# Patient Record
Sex: Female | Born: 1950
Health system: Southern US, Community
[De-identification: ages and names within clinical notes are randomized; demographics above are authoritative.]

## PROBLEM LIST (undated history)

## (undated) DIAGNOSIS — Z9109 Other allergy status, other than to drugs and biological substances: Secondary | ICD-10-CM

## (undated) DIAGNOSIS — J45909 Unspecified asthma, uncomplicated: Secondary | ICD-10-CM

## (undated) DIAGNOSIS — K859 Acute pancreatitis without necrosis or infection, unspecified: Secondary | ICD-10-CM

## (undated) DIAGNOSIS — I10 Essential (primary) hypertension: Secondary | ICD-10-CM

## (undated) DIAGNOSIS — F32A Depression, unspecified: Secondary | ICD-10-CM

## (undated) DIAGNOSIS — H349 Unspecified retinal vascular occlusion: Secondary | ICD-10-CM

## (undated) DIAGNOSIS — E079 Disorder of thyroid, unspecified: Secondary | ICD-10-CM

## (undated) DIAGNOSIS — F329 Major depressive disorder, single episode, unspecified: Secondary | ICD-10-CM

## (undated) DIAGNOSIS — R413 Other amnesia: Secondary | ICD-10-CM

## (undated) HISTORY — DX: Disorder of thyroid, unspecified: E07.9

## (undated) HISTORY — DX: Unspecified asthma, uncomplicated: J45.909

## (undated) HISTORY — DX: Major depressive disorder, single episode, unspecified: F32.9

## (undated) HISTORY — PX: CHOLECYSTECTOMY: SHX55

## (undated) HISTORY — DX: Other allergy status, other than to drugs and biological substances: Z91.09

## (undated) HISTORY — DX: Other amnesia: R41.3

## (undated) HISTORY — PX: TONSILLECTOMY AND ADENOIDECTOMY: SUR1326

## (undated) HISTORY — DX: Unspecified retinal vascular occlusion: H34.9

## (undated) HISTORY — DX: Depression, unspecified: F32.A

## (undated) HISTORY — DX: Essential (primary) hypertension: I10

## (undated) HISTORY — PX: EYE SURGERY: SHX253

## (undated) HISTORY — DX: Acute pancreatitis without necrosis or infection, unspecified: K85.90

---

## 2013-03-06 DIAGNOSIS — J45909 Unspecified asthma, uncomplicated: Secondary | ICD-10-CM | POA: Insufficient documentation

## 2013-03-06 DIAGNOSIS — G4700 Insomnia, unspecified: Secondary | ICD-10-CM | POA: Insufficient documentation

## 2013-03-06 DIAGNOSIS — I10 Essential (primary) hypertension: Secondary | ICD-10-CM | POA: Insufficient documentation

## 2013-11-09 DIAGNOSIS — F339 Major depressive disorder, recurrent, unspecified: Secondary | ICD-10-CM | POA: Insufficient documentation

## 2014-04-26 DIAGNOSIS — G4733 Obstructive sleep apnea (adult) (pediatric): Secondary | ICD-10-CM | POA: Insufficient documentation

## 2014-08-01 ENCOUNTER — Ambulatory Visit (INDEPENDENT_AMBULATORY_CARE_PROVIDER_SITE_OTHER): Payer: Medicare Other | Admitting: Family Medicine

## 2014-08-01 ENCOUNTER — Ambulatory Visit: Payer: Self-pay | Admitting: Family Medicine

## 2014-08-01 ENCOUNTER — Encounter: Payer: Self-pay | Admitting: Family Medicine

## 2014-08-01 VITALS — BP 134/84 | HR 87 | Temp 98.0°F | Ht 62.75 in | Wt 170.2 lb

## 2014-08-01 DIAGNOSIS — H8110 Benign paroxysmal vertigo, unspecified ear: Secondary | ICD-10-CM

## 2014-08-01 MED ORDER — MECLIZINE HCL 25 MG PO TABS: 25.0000 mg | ORAL_TABLET | Freq: Three times a day (TID) | ORAL | Status: DC | PRN

## 2014-08-01 NOTE — Patient Instructions (Signed)

## 2014-08-01 NOTE — Progress Notes (Signed)
Pre visit review using our clinic review tool, if applicable. No additional management support is needed unless otherwise documented below in the visit note. 

## 2014-08-01 NOTE — Progress Notes (Signed)
Subjective:    Patient ID: Cynthia May, female    DOB: [birth year], 63 y.o.   MRN: [id number]  HPIpt here to establish care.  She has hx of vertigo but has not had an episode in over a year.   No other complaints.   Review of Systems As above  Past Medical History  Diagnosis Date  . Asthma   . Depression   . Environmental allergies   . HTN (hypertension)   . Thyroid disease   . Pancreatitis    History   Social History  . Marital Status: Widowed    Spouse Name: N/A    Number of Children: N/A  . Years of Education: N/A   Occupational History  . Not on file.   Social History Main Topics  . Smoking status: Never Smoker   . Smokeless tobacco: Never Used  . Alcohol Use: No  . Drug Use: No  . Sexual Activity: Not on file   Other Topics Concern  . Not on file   Social History Narrative  . No narrative on file   Family History  Problem Relation Age of Onset  . Arthritis Mother   . Colon cancer Brother   . Arthritis Sister   . Lung cancer Paternal Grandmother   . Prostate cancer Brother   . Heart disease Mother   . Heart disease Father   . Heart disease Brother   . Stroke Mother   . Depression Sister     5 silbling had depression from Thyroid disease   . Thyroid disease Mother    \ Current Outpatient Prescriptions  Medication Sig Dispense Refill  . alendronate (FOSAMAX) 70 MG tablet Take 1 tablet by mouth once a week.  4  . ANUCORT-HC 25 MG suppository Place 1 suppository rectally as needed.  11  . clonazePAM (KLONOPIN) 0.5 MG tablet Take 1.5 mg by mouth daily.  4  . cycloSPORINE (RESTASIS) 0.05 % ophthalmic emulsion 1 drop 2 (two) times daily.    . DULoxetine (CYMBALTA) 60 MG capsule Take 1 capsule by mouth daily.  5  . levothyroxine (SYNTHROID, LEVOTHROID) 112 MCG tablet Take 1 tablet by mouth daily.  11  . lisinopril (PRINIVIL,ZESTRIL) 10 MG tablet Take 1 tablet by mouth daily.  3  . NUVIGIL 250 MG tablet Take 0.5 tablets by mouth daily.    .  meclizine (ANTIVERT) 25 MG tablet Take 1 tablet (25 mg total) by mouth 3 (three) times daily as needed for dizziness. 30 tablet 0   No current facility-administered medications for this visit.       Objective:   Physical Exam BP 140/90 mmHg  Pulse 87  Temp(Src) 98 F (36.7 C) (Oral)  Ht 5' 2.75" (1.594 m)  Wt 170 lb 3.2 oz (77.202 kg)  BMI 30.38 kg/m2  SpO2 97% General appearance: alert, cooperative, appears stated age and no distress Throat: lips, mucosa, and tongue normal; teeth and gums normal Neck: no adenopathy, no carotid bruit, no JVD, supple, symmetrical, trachea midline and thyroid not enlarged, symmetric, no tenderness/mass/nodules Lungs: clear to auscultation bilaterally Heart: S1, S2 normal Extremities: extremities normal, atraumatic, no cyanosis or edema        Assessment & Plan:  1. BPV (benign positional vertigo), unspecified laterality Has not had an episode on over a year - meclizine (ANTIVERT) 25 MG tablet; Take 1 tablet (25 mg total) by mouth 3 (three) times daily as needed for dizziness.  Dispense: 30 tablet; Refill: 0 rto for welcome to medicare

## 2014-10-15 ENCOUNTER — Emergency Department (HOSPITAL_BASED_OUTPATIENT_CLINIC_OR_DEPARTMENT_OTHER)
Admission: EM | Admit: 2014-10-15 | Discharge: 2014-10-15 | Disposition: A | Discharge status: Home / Self Care | Payer: Medicare Other | Attending: Emergency Medicine | Admitting: Emergency Medicine

## 2014-10-15 ENCOUNTER — Emergency Department (HOSPITAL_BASED_OUTPATIENT_CLINIC_OR_DEPARTMENT_OTHER): Payer: Medicare Other

## 2014-10-15 ENCOUNTER — Encounter (HOSPITAL_BASED_OUTPATIENT_CLINIC_OR_DEPARTMENT_OTHER): Payer: Self-pay | Admitting: Emergency Medicine

## 2014-10-15 DIAGNOSIS — Z8719 Personal history of other diseases of the digestive system: Secondary | ICD-10-CM | POA: Insufficient documentation

## 2014-10-15 DIAGNOSIS — J069 Acute upper respiratory infection, unspecified: Secondary | ICD-10-CM

## 2014-10-15 DIAGNOSIS — F329 Major depressive disorder, single episode, unspecified: Secondary | ICD-10-CM | POA: Insufficient documentation

## 2014-10-15 DIAGNOSIS — R059 Cough, unspecified: Secondary | ICD-10-CM

## 2014-10-15 DIAGNOSIS — Z79899 Other long term (current) drug therapy: Secondary | ICD-10-CM | POA: Diagnosis not present

## 2014-10-15 DIAGNOSIS — I10 Essential (primary) hypertension: Secondary | ICD-10-CM | POA: Insufficient documentation

## 2014-10-15 DIAGNOSIS — J45909 Unspecified asthma, uncomplicated: Secondary | ICD-10-CM | POA: Insufficient documentation

## 2014-10-15 DIAGNOSIS — R05 Cough: Secondary | ICD-10-CM

## 2014-10-15 DIAGNOSIS — E039 Hypothyroidism, unspecified: Secondary | ICD-10-CM | POA: Insufficient documentation

## 2014-10-15 MED ORDER — AZITHROMYCIN 250 MG PO TABS: 250.0000 mg | ORAL_TABLET | Freq: Every day | ORAL | Status: DC

## 2014-10-15 MED ORDER — ALBUTEROL SULFATE HFA 108 (90 BASE) MCG/ACT IN AERS: 2.0000 | INHALATION_SPRAY | RESPIRATORY_TRACT | Status: DC | PRN

## 2014-10-15 MED ORDER — METHYLPREDNISOLONE 4 MG PO KIT: PACK | ORAL | Status: DC

## 2014-10-15 MED ORDER — GUAIFENESIN-CODEINE 100-10 MG/5ML PO SOLN: 5.0000 mL | Freq: Four times a day (QID) | ORAL | Status: DC | PRN

## 2014-10-15 NOTE — ED Notes (Signed)
Pt presents to ED with complaints of cough and congestion for the past week. PT states she has been taking OTC meds without relief.

## 2014-10-15 NOTE — Discharge Instructions (Signed)
Upper Respiratory Infection, Adult An upper respiratory infection (URI) is also sometimes known as the common cold. The upper respiratory tract includes the nose, sinuses, throat, trachea, and bronchi. Bronchi are the airways leading to the lungs. Most people improve within 1 week, but symptoms can last up to 2 weeks. A residual cough may last even longer.  CAUSES Many different viruses can infect the tissues lining the upper respiratory tract. The tissues become irritated and inflamed and often become very moist. Mucus production is also common. A cold is contagious. You can easily spread the virus to others by oral contact. This includes kissing, sharing a glass, coughing, or sneezing. Touching your mouth or nose and then touching a surface, which is then touched by another person, can also spread the virus. SYMPTOMS  Symptoms typically develop 1 to 3 days after you come in contact with a cold virus. Symptoms vary from person to person. They may include:  Runny nose.  Sneezing.  Nasal congestion.  Sinus irritation.  Sore throat.  Loss of voice (laryngitis).  Cough.  Fatigue.  Muscle aches.  Loss of appetite.  Headache.  Low-grade fever. DIAGNOSIS  You might diagnose your own cold based on familiar symptoms, since most people get a cold 2 to 3 times a year. Your caregiver can confirm this based on your exam. Most importantly, your caregiver can check that your symptoms are not due to another disease such as strep throat, sinusitis, pneumonia, asthma, or epiglottitis. Blood tests, throat tests, and X-rays are not necessary to diagnose a common cold, but they may sometimes be helpful in excluding other more serious diseases. Your caregiver will decide if any further tests are required. RISKS AND COMPLICATIONS  You may be at risk for a more severe case of the common cold if you smoke cigarettes, have chronic heart disease (such as heart failure) or lung disease (such as asthma), or if  you have a weakened immune system. The very young and very old are also at risk for more serious infections. Bacterial sinusitis, middle ear infections, and bacterial pneumonia can complicate the common cold. The common cold can worsen asthma and chronic obstructive pulmonary disease (COPD). Sometimes, these complications can require emergency medical care and may be life-threatening. PREVENTION  The best way to protect against getting a cold is to practice good hygiene. Avoid oral or hand contact with people with cold symptoms. Wash your hands often if contact occurs. There is no clear evidence that vitamin C, vitamin E, echinacea, or exercise reduces the chance of developing a cold. However, it is always recommended to get plenty of rest and practice good nutrition. TREATMENT  Treatment is directed at relieving symptoms. There is no cure. Antibiotics are not effective, because the infection is caused by a virus, not by bacteria. Treatment may include:  Increased fluid intake. Sports drinks offer valuable electrolytes, sugars, and fluids.  Breathing heated mist or steam (vaporizer or shower).  Eating chicken soup or other clear broths, and maintaining good nutrition.  Getting plenty of rest.  Using gargles or lozenges for comfort.  Controlling fevers with ibuprofen or acetaminophen as directed by your caregiver.  Increasing usage of your inhaler if you have asthma. Zinc gel and zinc lozenges, taken in the first 24 hours of the common cold, can shorten the duration and lessen the severity of symptoms. Pain medicines may help with fever, muscle aches, and throat pain. A variety of non-prescription medicines are available to treat congestion and runny nose. Your caregiver   can make recommendations and may suggest nasal or lung inhalers for other symptoms.  HOME CARE INSTRUCTIONS   Only take over-the-counter or prescription medicines for pain, discomfort, or fever as directed by your  caregiver.  Use a warm mist humidifier or inhale steam from a shower to increase air moisture. This may keep secretions moist and make it easier to breathe.  Drink enough water and fluids to keep your urine clear or pale yellow.  Rest as needed.  Return to work when your temperature has returned to normal or as your caregiver advises. You may need to stay home longer to avoid infecting others. You can also use a face mask and careful hand washing to prevent spread of the virus. SEEK MEDICAL CARE IF:   After the first few days, you feel you are getting worse rather than better.  You need your caregiver's advice about medicines to control symptoms.  You develop chills, worsening shortness of breath, or brown or red sputum. These may be signs of pneumonia.  You develop yellow or brown nasal discharge or pain in the face, especially when you bend forward. These may be signs of sinusitis.  You develop a fever, swollen neck glands, pain with swallowing, or white areas in the back of your throat. These may be signs of strep throat. SEEK IMMEDIATE MEDICAL CARE IF:   You have a fever.  You develop severe or persistent headache, ear pain, sinus pain, or chest pain.  You develop wheezing, a prolonged cough, cough up blood, or have a change in your usual mucus (if you have chronic lung disease).  You develop sore muscles or a stiff neck. Document Released: 03/12/2001 Document Revised: 12/09/2011 Document Reviewed: 12/22/2013 [employer] Patient Information 2015 [employer]. This information is not intended to replace advice given to you by your health care provider. Make sure you discuss any questions you have with your health care provider.  

## 2014-10-15 NOTE — ED Provider Notes (Signed)
TIME SEEN: 11:40 AM  CHIEF COMPLAINT: Cough, congestion  HPI: Pt is a 64 y.o. with history of hypertension, asthma who presents to the emergency department with nasal congestion, productive cough with yellow sputum for the past week. Denies any fevers or chills. Denies any shortness of breath or wheezing. States that her cough keeps her from sleeping at night. Has tried using over-the-counter cough suppressants without relief.  ROS: See HPI Constitutional: no fever  Eyes: no drainage  ENT: no runny nose   Cardiovascular:  no chest pain  Resp: no SOB  GI: no vomiting GU: no dysuria Integumentary: no rash  Allergy: no hives  Musculoskeletal: no leg swelling  Neurological: no slurred speech ROS otherwise negative  PAST MEDICAL HISTORY/PAST SURGICAL HISTORY:  Past Medical History  Diagnosis Date  . Asthma   . Depression   . Environmental allergies   . HTN (hypertension)   . Thyroid disease   . Pancreatitis     MEDICATIONS:  Prior to Admission medications   Medication Sig Start Date End Date Taking? Authorizing Provider  alendronate (FOSAMAX) 70 MG tablet Take 1 tablet by mouth once a week. 05/23/14   Historical Provider, MD  ANUCORT-HC 25 MG suppository Place 1 suppository rectally as needed. 05/13/14   Historical Provider, MD  clonazePAM (KLONOPIN) 0.5 MG tablet Take 1.5 mg by mouth daily. 07/04/14   Lendon Colonel, MD  cycloSPORINE (RESTASIS) 0.05 % ophthalmic emulsion 1 drop 2 (two) times daily.    Historical Provider, MD  DULoxetine (CYMBALTA) 60 MG capsule Take 1 capsule by mouth daily. 07/20/14   Historical Provider, MD  levothyroxine (SYNTHROID, LEVOTHROID) 112 MCG tablet Take 1 tablet by mouth daily. 06/30/14   Historical Provider, MD  lisinopril (PRINIVIL,ZESTRIL) 10 MG tablet Take 1 tablet by mouth daily. 05/13/14   Historical Provider, MD  meclizine (ANTIVERT) 25 MG tablet Take 1 tablet (25 mg total) by mouth 3 (three) times daily as needed for dizziness. 08/01/14   Alferd Apa  Lowne, DO  NUVIGIL 250 MG tablet Take 0.5 tablets by mouth daily. 07/25/14   Lendon Colonel, MD    ALLERGIES:  Allergies  Allergen Reactions  . Augmentin [Amoxicillin-Pot Clavulanate] Other (See Comments)    Flu like symptoms  . Erythromycin Other (See Comments)    Flu like symptoms    SOCIAL HISTORY:  History  Substance Use Topics  . Smoking status: Never Smoker   . Smokeless tobacco: Never Used  . Alcohol Use: No    FAMILY HISTORY: Family History  Problem Relation Age of Onset  . Arthritis Mother   . Colon cancer Brother   . Arthritis Sister   . Lung cancer Paternal Grandmother   . Prostate cancer Brother   . Heart disease Mother   . Heart disease Father   . Heart disease Brother   . Stroke Mother   . Depression Sister     5 silbling had depression from Thyroid disease   . Thyroid disease Mother     EXAM: BP 160/93 mmHg  Pulse 108  Temp(Src) 97.9 F (36.6 C) (Oral)  Resp 24  Ht 5\' 3"  (1.6 m)  Wt 168 lb (76.204 kg)  BMI 29.77 kg/m2  SpO2 96% CONSTITUTIONAL: Alert and oriented and responds appropriately to questions. Well-appearing; well-nourished HEAD: Normocephalic EYES: Conjunctivae clear, PERRL ENT: normal nose; no rhinorrhea; moist mucous membranes; pharynx without lesions noted NECK: Supple, no meningismus, no LAD  CARD: RRR; S1 and S2 appreciated; no murmurs, no clicks, no rubs, no gallops RESP:  Normal chest excursion without splinting or tachypnea; breath sounds clear and equal bilaterally; no wheezes, no rhonchi, no rales,  ABD/GI: Normal bowel sounds; non-distended; soft, non-tender, no rebound, no guarding BACK:  The back appears normal and is non-tender to palpation, there is no CVA tenderness EXT: Normal ROM in all joints; non-tender to palpation; no edema; normal capillary refill; no cyanosis    SKIN: Normal color for age and race; warm NEURO: Moves all extremities equally PSYCH: The patient's mood and manner are appropriate. Grooming and  personal hygiene are appropriate.  MEDICAL DECISION MAKING: Patient here with urinary symptoms. Given her history of asthma and bronchospasm, will treat with albuterol, Medrol Dosepak, azithromycin. Chest x-ray clear. She is nontoxic appearing, in no respiratory distress, has no increased work of breathing and is not hypoxic. I feel she is safe to be discharged home. Discussed return precautions. We'll also discharge with prescription for guaifenesin with codeine. Patient verbalizes understanding and is comfortable with this plan.       [place], DO 10/15/14 1539

## 2014-10-31 LAB — HM MAMMOGRAPHY

## 2014-12-07 ENCOUNTER — Other Ambulatory Visit: Payer: Self-pay

## 2014-12-07 DIAGNOSIS — H8110 Benign paroxysmal vertigo, unspecified ear: Secondary | ICD-10-CM

## 2014-12-07 MED ORDER — MECLIZINE HCL 25 MG PO TABS: 25.0000 mg | ORAL_TABLET | Freq: Three times a day (TID) | ORAL | Status: DC | PRN

## 2014-12-07 MED ORDER — ALENDRONATE SODIUM 70 MG PO TABS: 70.0000 mg | ORAL_TABLET | ORAL | Status: DC

## 2014-12-16 ENCOUNTER — Telehealth: Payer: Self-pay | Admitting: Family Medicine

## 2014-12-16 NOTE — Telephone Encounter (Signed)
pre visit letter sent °

## 2015-01-02 ENCOUNTER — Encounter: Payer: Medicare Other | Admitting: Family Medicine

## 2015-01-04 ENCOUNTER — Telehealth: Payer: Self-pay

## 2015-01-04 NOTE — Telephone Encounter (Signed)
Pre visit call/questionnaire  completed

## 2015-01-05 ENCOUNTER — Ambulatory Visit (INDEPENDENT_AMBULATORY_CARE_PROVIDER_SITE_OTHER): Payer: Medicare Other | Admitting: Family Medicine

## 2015-01-05 ENCOUNTER — Encounter: Payer: Self-pay | Admitting: Family Medicine

## 2015-01-05 ENCOUNTER — Ambulatory Visit (HOSPITAL_BASED_OUTPATIENT_CLINIC_OR_DEPARTMENT_OTHER)
Admission: RE | Admit: 2015-01-05 | Discharge: 2015-01-05 | Disposition: A | Discharge status: Home / Self Care | Payer: Medicare Other | Source: Ambulatory Visit | Attending: Family Medicine | Admitting: Family Medicine

## 2015-01-05 VITALS — BP 120/86 | HR 96 | Temp 97.7°F | Ht 63.25 in | Wt 167.2 lb

## 2015-01-05 DIAGNOSIS — R918 Other nonspecific abnormal finding of lung field: Secondary | ICD-10-CM | POA: Insufficient documentation

## 2015-01-05 DIAGNOSIS — J208 Acute bronchitis due to other specified organisms: Secondary | ICD-10-CM

## 2015-01-05 DIAGNOSIS — R059 Cough, unspecified: Secondary | ICD-10-CM

## 2015-01-05 DIAGNOSIS — Z0000 Encounter for general adult medical examination without abnormal findings: Secondary | ICD-10-CM

## 2015-01-05 DIAGNOSIS — R05 Cough: Secondary | ICD-10-CM | POA: Insufficient documentation

## 2015-01-05 DIAGNOSIS — Z136 Encounter for screening for cardiovascular disorders: Secondary | ICD-10-CM | POA: Diagnosis not present

## 2015-01-05 DIAGNOSIS — J209 Acute bronchitis, unspecified: Secondary | ICD-10-CM

## 2015-01-05 MED ORDER — LEVOFLOXACIN 500 MG PO TABS: 500.0000 mg | ORAL_TABLET | Freq: Every day | ORAL | Status: DC

## 2015-01-05 MED ORDER — METHYLPREDNISOLONE ACETATE 80 MG/ML IJ SUSP
80.0000 mg | Freq: Once | INTRAMUSCULAR | Status: AC
  Administered 2015-01-05: 80 mg via INTRAMUSCULAR

## 2015-01-05 MED ORDER — ALBUTEROL SULFATE (2.5 MG/3ML) 0.083% IN NEBU
2.5000 mg | INHALATION_SOLUTION | Freq: Once | RESPIRATORY_TRACT | Status: AC
  Administered 2015-01-05: 2.5 mg via RESPIRATORY_TRACT

## 2015-01-05 MED ORDER — GUAIFENESIN-CODEINE 100-10 MG/5ML PO SOLN: ORAL | Status: DC

## 2015-01-05 NOTE — Progress Notes (Signed)
Pre visit review using our clinic review tool, if applicable. No additional management support is needed unless otherwise documented below in the visit note. 

## 2015-01-05 NOTE — Progress Notes (Signed)
Subjective:   Cynthia May is a 64 y.o. female who presents for Medicare Annual (Subsequent) preventive examination.  Review of Systems:   Review of Systems  Constitutional: Negative for activity change, appetite change and fatigue.  HENT: Negative for hearing loss, congestion, tinnitus and ear discharge.   Eyes: Negative for visual disturbance (see optho q1y -- vision corrected to 20/20 with glasses).  Respiratory: Negative for cough, chest tightness and shortness of breath.   Cardiovascular: Negative for chest pain, palpitations and leg swelling.  Gastrointestinal: Negative for abdominal pain, diarrhea, constipation and abdominal distention.  Genitourinary: Negative for urgency, frequency, decreased urine volume and difficulty urinating.  Musculoskeletal: Negative for back pain, arthralgias and gait problem.  Skin: Negative for color change, pallor and rash.  Neurological: Negative for dizziness, light-headedness, numbness and headaches.  Hematological: Negative for adenopathy. Does not bruise/bleed easily.  Psychiatric/Behavioral: Negative for suicidal ideas, confusion, sleep disturbance, self-injury, dysphoric mood, decreased concentration and agitation.  Pt is able to read and write and can do all ADLs No risk for falling No abuse/ violence in home          Objective:     Vitals: BP 120/86 mmHg  Pulse 96  Temp(Src) 97.7 F (36.5 C) (Oral)  Ht 5' 3.25" (1.607 m)  Wt 167 lb 3.2 oz (75.841 kg)  BMI 29.37 kg/m2  SpO2 96% BP 120/86 mmHg  Pulse 96  Temp(Src) 97.7 F (36.5 C) (Oral)  Ht 5' 3.25" (1.607 m)  Wt 167 lb 3.2 oz (75.841 kg)  BMI 29.37 kg/m2  SpO2 96% General appearance: alert, cooperative, appears stated age and no distress Head: Normocephalic, without obvious abnormality, atraumatic Eyes: conjunctivae/corneas clear. PERRL, EOM's intact. Fundi benign. Ears: normal TM's and external ear canals both ears Nose: Nares normal. Septum midline. Mucosa  normal. No drainage or sinus tenderness. Throat: lips, mucosa, and tongue normal; teeth and gums normal Neck: moderate anterior cervical adenopathy, supple, symmetrical, trachea midline and thyroid not enlarged, symmetric, no tenderness/mass/nodules Back: symmetric, no curvature. ROM normal. No CVA tenderness. Lungs: rhonchi bilaterally and wheezes bilaterally Breasts: normal appearance, no masses or tenderness Heart: regular rate and rhythm, S1, S2 normal, no murmur, click, rub or gallop Abdomen: soft, non-tender; bowel sounds normal; no masses,  no organomegaly Pelvic: not indicated; post-menopausal, no abnormal Pap smears in past Extremities: extremities normal, atraumatic, no cyanosis or edema Pulses: 2+ and symmetric Skin: Skin color, texture, turgor normal. No rashes or lesions Lymph nodes: Cervical, supraclavicular, and axillary nodes normal. Neurologic: Alert and oriented X 3, normal strength and tone. Normal symmetric reflexes. Normal coordination and gait Psych- no depression, anxiety--- treated by psych Tobacco History  Smoking status  . Never Smoker   Smokeless tobacco  . Never Used     Counseling given: Not Answered   Past Medical History  Diagnosis Date  . Asthma   . Depression   . Environmental allergies   . HTN (hypertension)   . Thyroid disease   . Pancreatitis    Past Surgical History  Procedure Laterality Date  . Cholecystectomy    . Tonsillectomy and adenoidectomy     Family History  Problem Relation Age of Onset  . Arthritis Mother   . Colon cancer Brother   . Arthritis Sister   . Lung cancer Paternal Grandmother   . Prostate cancer Brother   . Heart disease Mother   . Heart disease Father   . Heart disease Brother   . Stroke Mother   . Depression Sister  5 silbling had depression from Thyroid disease   . Thyroid disease Mother    History  Sexual Activity  . Sexual Activity: Not on file    Outpatient Encounter Prescriptions as of  01/05/2015  Medication Sig  . albuterol (PROVENTIL HFA;VENTOLIN HFA) 108 (90 BASE) MCG/ACT inhaler Inhale 2 puffs into the lungs every 4 (four) hours as needed for wheezing or shortness of breath.  Marland Kitchen alendronate (FOSAMAX) 70 MG tablet Take 1 tablet (70 mg total) by mouth once a week.  . ANUCORT-HC 25 MG suppository Place 1 suppository rectally as needed.  . clonazePAM (KLONOPIN) 0.5 MG tablet Take 1.5 mg by mouth daily.  . cycloSPORINE (RESTASIS) 0.05 % ophthalmic emulsion 1 drop 2 (two) times daily.  . DULoxetine (CYMBALTA) 60 MG capsule Take 1 capsule by mouth daily.  Marland Kitchen levothyroxine (SYNTHROID, LEVOTHROID) 112 MCG tablet Take 1 tablet by mouth daily.  Marland Kitchen lisinopril (PRINIVIL,ZESTRIL) 10 MG tablet Take 1 tablet by mouth daily.  . meclizine (ANTIVERT) 25 MG tablet Take 1 tablet (25 mg total) by mouth 3 (three) times daily as needed for dizziness.  Marland Kitchen NUVIGIL 250 MG tablet Take 0.5 tablets by mouth daily.  Marland Kitchen guaiFENesin-codeine 100-10 MG/5ML syrup 1 tsp po q6 h prn cough  . levofloxacin (LEVAQUIN) 500 MG tablet Take 1 tablet (500 mg total) by mouth daily.  . [DISCONTINUED] beclomethasone (QVAR) 80 MCG/ACT inhaler Frequency:   Dosage:0.0     Instructions:  Note:  . [EXPIRED] albuterol (PROVENTIL) (2.5 MG/3ML) 0.083% nebulizer solution 2.5 mg   . [EXPIRED] methylPREDNISolone acetate (DEPO-MEDROL) injection 80 mg     Activities of Daily Living In your present state of health, do you have any difficulty performing the following activities: 08/01/2014  Is the patient deaf or have difficulty hearing? N  Hearing N  Vision Y  Difficulty concentrating or making decisions N  Walking or climbing stairs? N  Doing errands, shopping? N    Patient Care Team: Rosalita Chessman, DO as PCP - General (Family Medicine) Lendon Colonel, MD as Referring Physician (Psychiatry) Amalia Greenhouse, MD as Referring Physician (Endocrinology) Lang Snow, MD as Referring Physician (Gynecology) Karoline Caldwell, OD as Referring  Physician (Optometry)    Assessment:    cpe Exercise Activities and Dietary recommendations---  Pt unable to exercise at this time due to breathing    Goals    None     Fall Risk Fall Risk  01/05/2015  Falls in the past year? No   Depression Screen PHQ 2/9 Scores 01/05/2015  PHQ - 2 Score 0     Cognitive Testing No flowsheet data found.  Immunization History  Administered Date(s) Administered  . Influenza-Unspecified 07/15/2014   Screening Tests Health Maintenance  Topic Date Due  . TETANUS/TDAP  08/26/1968  . DEXA SCAN  08/26/2014  . PNA vac Low Risk Adult (1 of 2 - PCV13) 08/26/2014  . HIV Screening  01/05/2016 (Originally [birth year]26/1966)  . INFLUENZA VACCINE  05/01/2015  . MAMMOGRAM  10/31/2016  . COLONOSCOPY  07/16/2021  . ZOSTAVAX  Addressed      Plan:    CPE During the course of the visit the patient was educated and counseled about the following appropriate screening and preventive services:   Vaccines to include Pneumoccal, Influenza, Hepatitis B, Td, Zostavax, HCV  Electrocardiogram  Cardiovascular Disease  Colorectal cancer screening  Bone density screening  Diabetes screening  Glaucoma screening  Mammography/PAP  Nutrition counseling   Patient Instructions (the written plan) was given to the patient.  1. Ischemic heart disease screen   - Basic metabolic panel - CBC with Differential/Platelet - Hepatic function panel - Lipid panel - POCT urinalysis dipstick  2. Cough   - albuterol (PROVENTIL) (2.5 MG/3ML) 0.083% nebulizer solution 2.5 mg; Take 3 mLs (2.5 mg total) by nebulization once. - DG Chest 2 View; Future  3. Acute bronchitis due to other specified organisms   - levofloxacin (LEVAQUIN) 500 MG tablet; Take 1 tablet (500 mg total) by mouth daily.  Dispense: 10 tablet; Refill: 0 - guaiFENesin-codeine 100-10 MG/5ML syrup; 1 tsp po q6 h prn cough  Dispense: 120 mL; Refill: 0 - methylPREDNISolone acetate (DEPO-MEDROL) injection 80  mg; Inject 1 mL (80 mg total) into the muscle once.  4. Preventative health care    Garnet Koyanagi, DO  01/05/2015

## 2015-01-05 NOTE — Patient Instructions (Signed)
Preventive Care for Adults A healthy lifestyle and preventive care can promote health and wellness. Preventive health guidelines for women include the following key practices.  A routine yearly physical is a good way to check with your health care provider about your health and preventive screening. It is a chance to share any concerns and updates on your health and to receive a thorough exam.  Visit your dentist for a routine exam and preventive care every 6 months. Brush your teeth twice a day and floss once a day. Good oral hygiene prevents tooth decay and gum disease.  The frequency of eye exams is based on your age, health, family medical history, use of contact lenses, and other factors. Follow your health care provider's recommendations for frequency of eye exams.  Eat a healthy diet. Foods like vegetables, fruits, whole grains, low-fat dairy products, and lean protein foods contain the nutrients you need without too many calories. Decrease your intake of foods high in solid fats, added sugars, and salt. Eat the right amount of calories for you.Get information about a proper diet from your health care provider, if necessary.  Regular physical exercise is one of the most important things you can do for your health. Most adults should get at least 150 minutes of moderate-intensity exercise (any activity that increases your heart rate and causes you to sweat) each week. In addition, most adults need muscle-strengthening exercises on 2 or more days a week.  Maintain a healthy weight. The body mass index (BMI) is a screening tool to identify possible weight problems. It provides an estimate of body fat based on height and weight. Your health care provider can find your BMI and can help you achieve or maintain a healthy weight.For adults 20 years and older:  A BMI below 18.5 is considered underweight.  A BMI of 18.5 to 24.9 is normal.  A BMI of 25 to 29.9 is considered overweight.  A BMI of  30 and above is considered obese.  Maintain normal blood lipids and cholesterol levels by exercising and minimizing your intake of saturated fat. Eat a balanced diet with plenty of fruit and vegetables. Blood tests for lipids and cholesterol should begin at age 76 and be repeated every 5 years. If your lipid or cholesterol levels are high, you are over 50, or you are at high risk for heart disease, you may need your cholesterol levels checked more frequently.Ongoing high lipid and cholesterol levels should be treated with medicines if diet and exercise are not working.  If you smoke, find out from your health care provider how to quit. If you do not use tobacco, do not start.  Lung cancer screening is recommended for adults aged 22-80 years who are at high risk for developing lung cancer because of a history of smoking. A yearly low-dose CT scan of the lungs is recommended for people who have at least a 30-pack-year history of smoking and are a current smoker or have quit within the past 15 years. A pack year of smoking is smoking an average of 1 pack of cigarettes a day for 1 year (for example: 1 pack a day for 30 years or 2 packs a day for 15 years). Yearly screening should continue until the smoker has stopped smoking for at least 15 years. Yearly screening should be stopped for people who develop a health problem that would prevent them from having lung cancer treatment.  If you are pregnant, do not drink alcohol. If you are breastfeeding,  be very cautious about drinking alcohol. If you are not pregnant and choose to drink alcohol, do not have more than 1 drink per day. One drink is considered to be 12 ounces (355 mL) of beer, 5 ounces (148 mL) of wine, or 1.5 ounces (44 mL) of liquor.  Avoid use of street drugs. Do not share needles with anyone. Ask for help if you need support or instructions about stopping the use of drugs.  High blood pressure causes heart disease and increases the risk of  stroke. Your blood pressure should be checked at least every 1 to 2 years. Ongoing high blood pressure should be treated with medicines if weight loss and exercise do not work.  If you are 75-52 years old, ask your health care provider if you should take aspirin to prevent strokes.  Diabetes screening involves taking a blood sample to check your fasting blood sugar level. This should be done once every 3 years, after age 15, if you are within normal weight and without risk factors for diabetes. Testing should be considered at a younger age or be carried out more frequently if you are overweight and have at least 1 risk factor for diabetes.  Breast cancer screening is essential preventive care for women. You should practice "breast self-awareness." This means understanding the normal appearance and feel of your breasts and may include breast self-examination. Any changes detected, no matter how small, should be reported to a health care provider. Women in their 58s and 30s should have a clinical breast exam (CBE) by a health care provider as part of a regular health exam every 1 to 3 years. After age 16, women should have a CBE every year. Starting at age 53, women should consider having a mammogram (breast X-ray test) every year. Women who have a family history of breast cancer should talk to their health care provider about genetic screening. Women at a high risk of breast cancer should talk to their health care providers about having an MRI and a mammogram every year.  Breast cancer gene (BRCA)-related cancer risk assessment is recommended for women who have family members with BRCA-related cancers. BRCA-related cancers include breast, ovarian, tubal, and peritoneal cancers. Having family members with these cancers may be associated with an increased risk for harmful changes (mutations) in the breast cancer genes BRCA1 and BRCA2. Results of the assessment will determine the need for genetic counseling and  BRCA1 and BRCA2 testing.  Routine pelvic exams to screen for cancer are no longer recommended for nonpregnant women who are considered low risk for cancer of the pelvic organs (ovaries, uterus, and vagina) and who do not have symptoms. Ask your health care provider if a screening pelvic exam is right for you.  If you have had past treatment for cervical cancer or a condition that could lead to cancer, you need Pap tests and screening for cancer for at least 20 years after your treatment. If Pap tests have been discontinued, your risk factors (such as having a new sexual partner) need to be reassessed to determine if screening should be resumed. Some women have medical problems that increase the chance of getting cervical cancer. In these cases, your health care provider may recommend more frequent screening and Pap tests.  The HPV test is an additional test that may be used for cervical cancer screening. The HPV test looks for the virus that can cause the cell changes on the cervix. The cells collected during the Pap test can be  tested for HPV. The HPV test could be used to screen women aged 30 years and older, and should be used in women of any age who have unclear Pap test results. After the age of 30, women should have HPV testing at the same frequency as a Pap test.  Colorectal cancer can be detected and often prevented. Most routine colorectal cancer screening begins at the age of 50 years and continues through age 75 years. However, your health care provider may recommend screening at an earlier age if you have risk factors for colon cancer. On a yearly basis, your health care provider may provide home test kits to check for hidden blood in the stool. Use of a small camera at the end of a tube, to directly examine the colon (sigmoidoscopy or colonoscopy), can detect the earliest forms of colorectal cancer. Talk to your health care provider about this at age 50, when routine screening begins. Direct  exam of the colon should be repeated every 5-10 years through age 75 years, unless early forms of pre-cancerous polyps or small growths are found.  People who are at an increased risk for hepatitis B should be screened for this virus. You are considered at high risk for hepatitis B if:  You were born in a country where hepatitis B occurs often. Talk with your health care provider about which countries are considered high risk.  Your parents were born in a high-risk country and you have not received a shot to protect against hepatitis B (hepatitis B vaccine).  You have HIV or AIDS.  You use needles to inject street drugs.  You live with, or have sex with, someone who has hepatitis B.  You get hemodialysis treatment.  You take certain medicines for conditions like cancer, organ transplantation, and autoimmune conditions.  Hepatitis C blood testing is recommended for all people born from 1945 through 1965 and any individual with known risks for hepatitis C.  Practice safe sex. Use condoms and avoid high-risk sexual practices to reduce the spread of sexually transmitted infections (STIs). STIs include gonorrhea, chlamydia, syphilis, trichomonas, herpes, HPV, and human immunodeficiency virus (HIV). Herpes, HIV, and HPV are viral illnesses that have no cure. They can result in disability, cancer, and death.  You should be screened for sexually transmitted illnesses (STIs) including gonorrhea and chlamydia if:  You are sexually active and are younger than 24 years.  You are older than 24 years and your health care provider tells you that you are at risk for this type of infection.  Your sexual activity has changed since you were last screened and you are at an increased risk for chlamydia or gonorrhea. Ask your health care provider if you are at risk.  If you are at risk of being infected with HIV, it is recommended that you take a prescription medicine daily to prevent HIV infection. This is  called preexposure prophylaxis (PrEP). You are considered at risk if:  You are a heterosexual woman, are sexually active, and are at increased risk for HIV infection.  You take drugs by injection.  You are sexually active with a partner who has HIV.  Talk with your health care provider about whether you are at high risk of being infected with HIV. If you choose to begin PrEP, you should first be tested for HIV. You should then be tested every 3 months for as long as you are taking PrEP.  Osteoporosis is a disease in which the bones lose minerals and strength   with aging. This can result in serious bone fractures or breaks. The risk of osteoporosis can be identified using a bone density scan. Women ages 65 years and over and women at risk for fractures or osteoporosis should discuss screening with their health care providers. Ask your health care provider whether you should take a calcium supplement or vitamin D to reduce the rate of osteoporosis.  Menopause can be associated with physical symptoms and risks. Hormone replacement therapy is available to decrease symptoms and risks. You should talk to your health care provider about whether hormone replacement therapy is right for you.  Use sunscreen. Apply sunscreen liberally and repeatedly throughout the day. You should seek shade when your shadow is shorter than you. Protect yourself by wearing long sleeves, pants, a wide-brimmed hat, and sunglasses year round, whenever you are outdoors.  Once a month, do a whole body skin exam, using a mirror to look at the skin on your back. Tell your health care provider of new moles, moles that have irregular borders, moles that are larger than a pencil eraser, or moles that have changed in shape or color.  Stay current with required vaccines (immunizations).  Influenza vaccine. All adults should be immunized every year.  Tetanus, diphtheria, and acellular pertussis (Td, Tdap) vaccine. Pregnant women should  receive 1 dose of Tdap vaccine during each pregnancy. The dose should be obtained regardless of the length of time since the last dose. Immunization is preferred during the 27th-36th week of gestation. An adult who has not previously received Tdap or who does not know her vaccine status should receive 1 dose of Tdap. This initial dose should be followed by tetanus and diphtheria toxoids (Td) booster doses every 10 years. Adults with an unknown or incomplete history of completing a 3-dose immunization series with Td-containing vaccines should begin or complete a primary immunization series including a Tdap dose. Adults should receive a Td booster every 10 years.  Varicella vaccine. An adult without evidence of immunity to varicella should receive 2 doses or a second dose if she has previously received 1 dose. Pregnant females who do not have evidence of immunity should receive the first dose after pregnancy. This first dose should be obtained before leaving the health care facility. The second dose should be obtained 4-8 weeks after the first dose.  Human papillomavirus (HPV) vaccine. Females aged 13-26 years who have not received the vaccine previously should obtain the 3-dose series. The vaccine is not recommended for use in pregnant females. However, pregnancy testing is not needed before receiving a dose. If a female is found to be pregnant after receiving a dose, no treatment is needed. In that case, the remaining doses should be delayed until after the pregnancy. Immunization is recommended for any person with an immunocompromised condition through the age of 26 years if she did not get any or all doses earlier. During the 3-dose series, the second dose should be obtained 4-8 weeks after the first dose. The third dose should be obtained 24 weeks after the first dose and 16 weeks after the second dose.  Zoster vaccine. One dose is recommended for adults aged 60 years or older unless certain conditions are  present.  Measles, mumps, and rubella (MMR) vaccine. Adults born before 1957 generally are considered immune to measles and mumps. Adults born in 1957 or later should have 1 or more doses of MMR vaccine unless there is a contraindication to the vaccine or there is laboratory evidence of immunity to   each of the three diseases. A routine second dose of MMR vaccine should be obtained at least 28 days after the first dose for students attending postsecondary schools, health care workers, or international travelers. People who received inactivated measles vaccine or an unknown type of measles vaccine during 1963-1967 should receive 2 doses of MMR vaccine. People who received inactivated mumps vaccine or an unknown type of mumps vaccine before 1979 and are at high risk for mumps infection should consider immunization with 2 doses of MMR vaccine. For females of childbearing age, rubella immunity should be determined. If there is no evidence of immunity, females who are not pregnant should be vaccinated. If there is no evidence of immunity, females who are pregnant should delay immunization until after pregnancy. Unvaccinated health care workers born before 1957 who lack laboratory evidence of measles, mumps, or rubella immunity or laboratory confirmation of disease should consider measles and mumps immunization with 2 doses of MMR vaccine or rubella immunization with 1 dose of MMR vaccine.  Pneumococcal 13-valent conjugate (PCV13) vaccine. When indicated, a person who is uncertain of her immunization history and has no record of immunization should receive the PCV13 vaccine. An adult aged 19 years or older who has certain medical conditions and has not been previously immunized should receive 1 dose of PCV13 vaccine. This PCV13 should be followed with a dose of pneumococcal polysaccharide (PPSV23) vaccine. The PPSV23 vaccine dose should be obtained at least 8 weeks after the dose of PCV13 vaccine. An adult aged 19  years or older who has certain medical conditions and previously received 1 or more doses of PPSV23 vaccine should receive 1 dose of PCV13. The PCV13 vaccine dose should be obtained 1 or more years after the last PPSV23 vaccine dose.  Pneumococcal polysaccharide (PPSV23) vaccine. When PCV13 is also indicated, PCV13 should be obtained first. All adults aged 65 years and older should be immunized. An adult younger than age 65 years who has certain medical conditions should be immunized. Any person who resides in a nursing home or long-term care facility should be immunized. An adult smoker should be immunized. People with an immunocompromised condition and certain other conditions should receive both PCV13 and PPSV23 vaccines. People with human immunodeficiency virus (HIV) infection should be immunized as soon as possible after diagnosis. Immunization during chemotherapy or radiation therapy should be avoided. Routine use of PPSV23 vaccine is not recommended for American Indians, [place] Natives, or people younger than 65 years unless there are medical conditions that require PPSV23 vaccine. When indicated, people who have unknown immunization and have no record of immunization should receive PPSV23 vaccine. One-time revaccination 5 years after the first dose of PPSV23 is recommended for people aged 19-64 years who have chronic kidney failure, nephrotic syndrome, asplenia, or immunocompromised conditions. People who received 1-2 doses of PPSV23 before age 65 years should receive another dose of PPSV23 vaccine at age 65 years or later if at least 5 years have passed since the previous dose. Doses of PPSV23 are not needed for people immunized with PPSV23 at or after age 65 years.  Meningococcal vaccine. Adults with asplenia or persistent complement component deficiencies should receive 2 doses of quadrivalent meningococcal conjugate (MenACWY-D) vaccine. The doses should be obtained at least 2 months apart.  Microbiologists working with certain meningococcal bacteria, military recruits, people at risk during an outbreak, and people who travel to or live in countries with a high rate of meningitis should be immunized. A first-year college student up through age   21 years who is living in a residence hall should receive a dose if she did not receive a dose on or after her 16th birthday. Adults who have certain high-risk conditions should receive one or more doses of vaccine.  Hepatitis A vaccine. Adults who wish to be protected from this disease, have certain high-risk conditions, work with hepatitis A-infected animals, work in hepatitis A research labs, or travel to or work in countries with a high rate of hepatitis A should be immunized. Adults who were previously unvaccinated and who anticipate close contact with an international adoptee during the first 60 days after arrival in the [place] from a country with a high rate of hepatitis A should be immunized.  Hepatitis B vaccine. Adults who wish to be protected from this disease, have certain high-risk conditions, may be exposed to blood or other infectious body fluids, are household contacts or sex partners of hepatitis B positive people, are clients or workers in certain care facilities, or travel to or work in countries with a high rate of hepatitis B should be immunized.  Haemophilus influenzae type b (Hib) vaccine. A previously unvaccinated person with asplenia or sickle cell disease or having a scheduled splenectomy should receive 1 dose of Hib vaccine. Regardless of previous immunization, a recipient of a hematopoietic stem cell transplant should receive a 3-dose series 6-12 months after her successful transplant. Hib vaccine is not recommended for adults with HIV infection. Preventive Services / Frequency Ages 64 to 68 years  Blood pressure check.** / Every 1 to 2 years.  Lipid and cholesterol check.** / Every 5 years beginning at age  22.  Clinical breast exam.** / Every 3 years for women in their 88s and 53s.  BRCA-related cancer risk assessment.** / For women who have family members with a BRCA-related cancer (breast, ovarian, tubal, or peritoneal cancers).  Pap test.** / Every 2 years from ages 90 through 51. Every 3 years starting at age 21 through age 56 or 3 with a history of 3 consecutive normal Pap tests.  HPV screening.** / Every 3 years from ages 24 through ages 1 to 46 with a history of 3 consecutive normal Pap tests.  Hepatitis C blood test.** / For any individual with known risks for hepatitis C.  Skin self-exam. / Monthly.  Influenza vaccine. / Every year.  Tetanus, diphtheria, and acellular pertussis (Tdap, Td) vaccine.** / Consult your health care provider. Pregnant women should receive 1 dose of Tdap vaccine during each pregnancy. 1 dose of Td every 10 years.  Varicella vaccine.** / Consult your health care provider. Pregnant females who do not have evidence of immunity should receive the first dose after pregnancy.  HPV vaccine. / 3 doses over 6 months, if 72 and younger. The vaccine is not recommended for use in pregnant females. However, pregnancy testing is not needed before receiving a dose.  Measles, mumps, rubella (MMR) vaccine.** / You need at least 1 dose of MMR if you were born in 1957 or later. You may also need a 2nd dose. For females of childbearing age, rubella immunity should be determined. If there is no evidence of immunity, females who are not pregnant should be vaccinated. If there is no evidence of immunity, females who are pregnant should delay immunization until after pregnancy.  Pneumococcal 13-valent conjugate (PCV13) vaccine.** / Consult your health care provider.  Pneumococcal polysaccharide (PPSV23) vaccine.** / 1 to 2 doses if you smoke cigarettes or if you have certain conditions.  Meningococcal vaccine.** /  1 dose if you are age 19 to 21 years and a first-year college  student living in a residence hall, or have one of several medical conditions, you need to get vaccinated against meningococcal disease. You may also need additional booster doses.  Hepatitis A vaccine.** / Consult your health care provider.  Hepatitis B vaccine.** / Consult your health care provider.  Haemophilus influenzae type b (Hib) vaccine.** / Consult your health care provider. Ages 40 to 64 years  Blood pressure check.** / Every 1 to 2 years.  Lipid and cholesterol check.** / Every 5 years beginning at age 20 years.  Lung cancer screening. / Every year if you are aged 55-80 years and have a 30-pack-year history of smoking and currently smoke or have quit within the past 15 years. Yearly screening is stopped once you have quit smoking for at least 15 years or develop a health problem that would prevent you from having lung cancer treatment.  Clinical breast exam.** / Every year after age 40 years.  BRCA-related cancer risk assessment.** / For women who have family members with a BRCA-related cancer (breast, ovarian, tubal, or peritoneal cancers).  Mammogram.** / Every year beginning at age 40 years and continuing for as long as you are in good health. Consult with your health care provider.  Pap test.** / Every 3 years starting at age 30 years through age 65 or 70 years with a history of 3 consecutive normal Pap tests.  HPV screening.** / Every 3 years from ages 30 years through ages 65 to 70 years with a history of 3 consecutive normal Pap tests.  Fecal occult blood test (FOBT) of stool. / Every year beginning at age 50 years and continuing until age 75 years. You may not need to do this test if you get a colonoscopy every 10 years.  Flexible sigmoidoscopy or colonoscopy.** / Every 5 years for a flexible sigmoidoscopy or every 10 years for a colonoscopy beginning at age 50 years and continuing until age 75 years.  Hepatitis C blood test.** / For all people born from 1945 through  1965 and any individual with known risks for hepatitis C.  Skin self-exam. / Monthly.  Influenza vaccine. / Every year.  Tetanus, diphtheria, and acellular pertussis (Tdap/Td) vaccine.** / Consult your health care provider. Pregnant women should receive 1 dose of Tdap vaccine during each pregnancy. 1 dose of Td every 10 years.  Varicella vaccine.** / Consult your health care provider. Pregnant females who do not have evidence of immunity should receive the first dose after pregnancy.  Zoster vaccine.** / 1 dose for adults aged 60 years or older.  Measles, mumps, rubella (MMR) vaccine.** / You need at least 1 dose of MMR if you were born in 1957 or later. You may also need a 2nd dose. For females of childbearing age, rubella immunity should be determined. If there is no evidence of immunity, females who are not pregnant should be vaccinated. If there is no evidence of immunity, females who are pregnant should delay immunization until after pregnancy.  Pneumococcal 13-valent conjugate (PCV13) vaccine.** / Consult your health care provider.  Pneumococcal polysaccharide (PPSV23) vaccine.** / 1 to 2 doses if you smoke cigarettes or if you have certain conditions.  Meningococcal vaccine.** / Consult your health care provider.  Hepatitis A vaccine.** / Consult your health care provider.  Hepatitis B vaccine.** / Consult your health care provider.  Haemophilus influenzae type b (Hib) vaccine.** / Consult your health care provider. Ages 65   years and over  Blood pressure check.** / Every 1 to 2 years.  Lipid and cholesterol check.** / Every 5 years beginning at age 22 years.  Lung cancer screening. / Every year if you are aged 73-80 years and have a 30-pack-year history of smoking and currently smoke or have quit within the past 15 years. Yearly screening is stopped once you have quit smoking for at least 15 years or develop a health problem that would prevent you from having lung cancer  treatment.  Clinical breast exam.** / Every year after age 4 years.  BRCA-related cancer risk assessment.** / For women who have family members with a BRCA-related cancer (breast, ovarian, tubal, or peritoneal cancers).  Mammogram.** / Every year beginning at age 40 years and continuing for as long as you are in good health. Consult with your health care provider.  Pap test.** / Every 3 years starting at age 9 years through age 34 or 91 years with 3 consecutive normal Pap tests. Testing can be stopped between 65 and 70 years with 3 consecutive normal Pap tests and no abnormal Pap or HPV tests in the past 10 years.  HPV screening.** / Every 3 years from ages 57 years through ages 64 or 45 years with a history of 3 consecutive normal Pap tests. Testing can be stopped between 65 and 70 years with 3 consecutive normal Pap tests and no abnormal Pap or HPV tests in the past 10 years.  Fecal occult blood test (FOBT) of stool. / Every year beginning at age 15 years and continuing until age 17 years. You may not need to do this test if you get a colonoscopy every 10 years.  Flexible sigmoidoscopy or colonoscopy.** / Every 5 years for a flexible sigmoidoscopy or every 10 years for a colonoscopy beginning at age 86 years and continuing until age 71 years.  Hepatitis C blood test.** / For all people born from [birth year] through 1967 and any individual with known risks for hepatitis C.  Osteoporosis screening.** / A one-time screening for women ages 83 years and over and women at risk for fractures or osteoporosis.  Skin self-exam. / Monthly.  Influenza vaccine. / Every year.  Tetanus, diphtheria, and acellular pertussis (Tdap/Td) vaccine.** / 1 dose of Td every 10 years.  Varicella vaccine.** / Consult your health care provider.  Zoster vaccine.** / 1 dose for adults aged 61 years or older.  Pneumococcal 13-valent conjugate (PCV13) vaccine.** / Consult your health care provider.  Pneumococcal  polysaccharide (PPSV23) vaccine.** / 1 dose for all adults aged 28 years and older.  Meningococcal vaccine.** / Consult your health care provider.  Hepatitis A vaccine.** / Consult your health care provider.  Hepatitis B vaccine.** / Consult your health care provider.  Haemophilus influenzae type b (Hib) vaccine.** / Consult your health care provider. ** Family history and personal history of risk and conditions may change your health care provider's recommendations. Document Released: 11/12/2001 Document Revised: 01/31/2014 Document Reviewed: 02/11/2011 [facility] Patient Information 2015 [place], [place]. This information is not intended to replace advice given to you by your health care provider. Make sure you discuss any questions you have with your health care provider.

## 2015-01-19 ENCOUNTER — Encounter: Payer: Self-pay | Admitting: Family Medicine

## 2015-01-19 ENCOUNTER — Ambulatory Visit (INDEPENDENT_AMBULATORY_CARE_PROVIDER_SITE_OTHER): Payer: Medicare Other | Admitting: Family Medicine

## 2015-01-19 VITALS — BP 120/82 | HR 88 | Temp 97.5°F | Wt 162.0 lb

## 2015-01-19 DIAGNOSIS — J189 Pneumonia, unspecified organism: Secondary | ICD-10-CM | POA: Insufficient documentation

## 2015-01-19 DIAGNOSIS — Z23 Encounter for immunization: Secondary | ICD-10-CM

## 2015-01-19 LAB — POCT URINALYSIS DIPSTICK
Glucose, UA: NEGATIVE
Leukocytes, UA: NEGATIVE
NITRITE UA: NEGATIVE
UROBILINOGEN UA: 4
pH, UA: 5.5

## 2015-01-19 MED ORDER — TETANUS-DIPHTH-ACELL PERTUSSIS 5-2.5-18.5 LF-MCG/0.5 IM SUSP: 0.5000 mL | Freq: Once | INTRAMUSCULAR | Status: DC

## 2015-01-19 MED ORDER — PNEUMOCOCCAL 13-VAL CONJ VACC IM SUSP: 0.5000 mL | INTRAMUSCULAR | Status: DC

## 2015-01-19 NOTE — Assessment & Plan Note (Signed)
Symptoms resolved except for fatigue Recheck cxr 2 weeks

## 2015-01-19 NOTE — Patient Instructions (Addendum)
Get chest ray in 2 weeks-- the order is in the computer   Pneumonia Pneumonia is an infection of the lungs.  CAUSES Pneumonia may be caused by bacteria or a virus. Usually, these infections are caused by breathing infectious particles into the lungs (respiratory tract). SIGNS AND SYMPTOMS   Cough.  Fever.  Chest pain.  Increased rate of breathing.  Wheezing.  Mucus production. DIAGNOSIS  If you have the common symptoms of pneumonia, your health care provider will typically confirm the diagnosis with a chest X-ray. The X-ray will show an abnormality in the lung (pulmonary infiltrate) if you have pneumonia. Other tests of your blood, urine, or sputum may be done to find the specific cause of your pneumonia. Your health care provider may also do tests (blood gases or pulse oximetry) to see how well your lungs are working. TREATMENT  Some forms of pneumonia may be spread to other people when you cough or sneeze. You may be asked to wear a mask before and during your exam. Pneumonia that is caused by bacteria is treated with antibiotic medicine. Pneumonia that is caused by the influenza virus may be treated with an antiviral medicine. Most other viral infections must run their course. These infections will not respond to antibiotics.  HOME CARE INSTRUCTIONS   Cough suppressants may be used if you are losing too much rest. However, coughing protects you by clearing your lungs. You should avoid using cough suppressants if you can.  Your health care provider may have prescribed medicine if he or she thinks your pneumonia is caused by bacteria or influenza. Finish your medicine even if you start to feel better.  Your health care provider may also prescribe an expectorant. This loosens the mucus to be coughed up.  Take medicines only as directed by your health care provider.  Do not smoke. Smoking is a common cause of bronchitis and can contribute to pneumonia. If you are a smoker and  continue to smoke, your cough may last several weeks after your pneumonia has cleared.  A cold steam vaporizer or humidifier in your room or home may help loosen mucus.  Coughing is often worse at night. Sleeping in a semi-upright position in a recliner or using a couple pillows under your head will help with this.  Get rest as you feel it is needed. Your body will usually let you know when you need to rest. PREVENTION A pneumococcal shot (vaccine) is available to prevent a common bacterial cause of pneumonia. This is usually suggested for:  People over 19 years old.  Patients on chemotherapy.  People with chronic lung problems, such as bronchitis or emphysema.  People with immune system problems. If you are over 65 or have a high risk condition, you may receive the pneumococcal vaccine if you have not received it before. In some countries, a routine influenza vaccine is also recommended. This vaccine can help prevent some cases of pneumonia.You may be offered the influenza vaccine as part of your care. If you smoke, it is time to quit. You may receive instructions on how to stop smoking. Your health care provider can provide medicines and counseling to help you quit. SEEK MEDICAL CARE IF: You have a fever. SEEK IMMEDIATE MEDICAL CARE IF:   Your illness becomes worse. This is especially true if you are elderly or weakened from any other disease.  You cannot control your cough with suppressants and are losing sleep.  You begin coughing up blood.  You develop pain  which is getting worse or is uncontrolled with medicines.  Any of the symptoms which initially brought you in for treatment are getting worse rather than better.  You develop shortness of breath or chest pain. MAKE SURE YOU:   Understand these instructions.  Will watch your condition.  Will get help right away if you are not doing well or get worse. Document Released: 09/16/2005 Document Revised: 01/31/2014 Document  Reviewed: 12/06/2010 [facility] Patient Information 2015 [place], [place]. This information is not intended to replace advice given to you by your health care provider. Make sure you discuss any questions you have with your health care provider.

## 2015-01-19 NOTE — Progress Notes (Signed)
Patient ID: Cynthia May, female    DOB: [birth year]  Age: 64 y.o. MRN: [id number]    Subjective:  Subjective HPI Cynthia May presents for f/u pneumonia. Pt is feeling better but is tired.    Review of Systems  Constitutional: Positive for fatigue. Negative for activity change, appetite change and unexpected weight change.  Respiratory: Negative for cough and shortness of breath.   Cardiovascular: Negative for chest pain and palpitations.  Psychiatric/Behavioral: Negative for behavioral problems and dysphoric mood. The patient is not nervous/anxious.     History Past Medical History  Diagnosis Date  . Asthma   . Depression   . Environmental allergies   . HTN (hypertension)   . Thyroid disease   . Pancreatitis     She has past surgical history that includes Cholecystectomy and Tonsillectomy and adenoidectomy.   Her family history includes Arthritis in her mother and sister; Colon cancer in her brother; Depression in her sister; Heart disease in her brother, father, and mother; Lung cancer in her paternal grandmother; Prostate cancer in her brother; Stroke in her mother; Thyroid disease in her mother.She reports that she has never smoked. She has never used smokeless tobacco. She reports that she does not drink alcohol or use illicit drugs.  Current Outpatient Prescriptions on File Prior to Visit  Medication Sig Dispense Refill  . albuterol (PROVENTIL HFA;VENTOLIN HFA) 108 (90 BASE) MCG/ACT inhaler Inhale 2 puffs into the lungs every 4 (four) hours as needed for wheezing or shortness of breath. 1 Inhaler 0  . alendronate (FOSAMAX) 70 MG tablet Take 1 tablet (70 mg total) by mouth once a week. 4 tablet 4  . ANUCORT-HC 25 MG suppository Place 1 suppository rectally as needed.  11  . clonazePAM (KLONOPIN) 0.5 MG tablet Take 1.5 mg by mouth daily.  4  . cycloSPORINE (RESTASIS) 0.05 % ophthalmic emulsion 1 drop 2 (two) times daily.    . DULoxetine (CYMBALTA) 60 MG capsule  Take 1 capsule by mouth daily.  5  . guaiFENesin-codeine 100-10 MG/5ML syrup 1 tsp po q6 h prn cough 120 mL 0  . levothyroxine (SYNTHROID, LEVOTHROID) 112 MCG tablet Take 1 tablet by mouth daily.  11  . lisinopril (PRINIVIL,ZESTRIL) 10 MG tablet Take 1 tablet by mouth daily.  3  . meclizine (ANTIVERT) 25 MG tablet Take 1 tablet (25 mg total) by mouth 3 (three) times daily as needed for dizziness. 30 tablet 0  . NUVIGIL 250 MG tablet Take 0.5 tablets by mouth daily.     No current facility-administered medications on file prior to visit.     Objective:  Objective Physical Exam  Constitutional: She is oriented to person, place, and time. She appears well-developed and well-nourished.  HENT:  Right Ear: External ear normal.  Left Ear: External ear normal.  Eyes: Conjunctivae are normal. Right eye exhibits no discharge. Left eye exhibits no discharge.  Cardiovascular: Normal rate, regular rhythm and normal heart sounds.   No murmur heard. Pulmonary/Chest: Effort normal and breath sounds normal. No respiratory distress. She has no wheezes. She has no rales. She exhibits no tenderness.  Musculoskeletal: She exhibits no edema.  Neurological: She is alert and oriented to person, place, and time.   BP 120/82 mmHg  Pulse 88  Temp(Src) 97.5 F (36.4 C) (Oral)  Wt 162 lb (73.483 kg)  SpO2 96% Wt Readings from Last 3 Encounters:  01/19/15 162 lb (73.483 kg)  01/05/15 167 lb 3.2 oz (75.841 kg)  10/15/14 168 lb (76.204 kg)  No results found for: WBC, HGB, HCT, PLT, GLUCOSE, CHOL, TRIG, HDL, LDLDIRECT, LDLCALC, ALT, AST, NA, K, CL, CREATININE, BUN, CO2, TSH, PSA, INR, GLUF, HGBA1C, MICROALBUR  Dg Chest 2 View  01/06/2015   CLINICAL DATA:  One week history of cough and chest pain  EXAM: CHEST  2 VIEW  COMPARISON:  October 15, 2014  FINDINGS: There is patchy interstitial infiltrate in portions of the right middle and lower lobes. Elsewhere lungs clear. Heart size and pulmonary vascularity are  normal. No adenopathy. There is degenerative change in the thoracic spine.  IMPRESSION: Interstitial infiltrate in portions of the right middle and lower lobes. Lungs elsewhere clear.   Electronically Signed   By: Lowella Grip III M.D.   On: 01/06/2015 08:22     Assessment & Plan:  Plan I have discontinued Cynthia May's levofloxacin. I am also having her start on pneumococcal 13-valent conjugate vaccine and Tdap. Additionally, I am having her maintain her NUVIGIL, clonazePAM, DULoxetine, ANUCORT-HC, levothyroxine, lisinopril, cycloSPORINE, albuterol, alendronate, meclizine, and guaiFENesin-codeine.  Meds ordered this encounter  Medications  . pneumococcal 13-valent conjugate vaccine (PREVNAR 13) SUSP injection    Sig: Inject 0.5 mLs into the muscle tomorrow at 10 am.    Dispense:  1 mL    Refill:  0  . Tdap (BOOSTRIX) 5-2.5-18.5 LF-MCG/0.5 injection    Sig: Inject 0.5 mLs into the muscle once.    Dispense:  0.5 mL    Refill:  0    Problem List Items Addressed This Visit    CAP (community acquired pneumonia) - Primary    Symptoms resolved except for fatigue Recheck cxr 2 weeks       Relevant Orders   DG Chest 2 View    Other Visit Diagnoses    Need for prophylactic vaccination against Streptococcus pneumoniae (pneumococcus)        Relevant Medications    pneumococcal 13-valent conjugate vaccine (PREVNAR 13) SUSP injection    Need for tetanus booster        Relevant Medications    Tdap ([place]) 5-2.5-18.5 LF-MCG/0.5 injection       Follow-up: Return if symptoms worsen or fail to improve.  Garnet Koyanagi, DO

## 2015-01-19 NOTE — Addendum Note (Signed)
Addended by: Modena Morrow D on: 01/19/2015 04:52 PM   Modules accepted: Orders

## 2015-01-19 NOTE — Progress Notes (Signed)
Pre visit review using our clinic review tool, if applicable. No additional management support is needed unless otherwise documented below in the visit note. 

## 2015-01-20 LAB — CBC WITH DIFFERENTIAL/PLATELET
BASOS PCT: 0.8 % (ref 0.0–3.0)
Basophils Absolute: 0.1 10*3/uL (ref 0.0–0.1)
EOS ABS: 0.2 10*3/uL (ref 0.0–0.7)
Eosinophils Relative: 3.4 % (ref 0.0–5.0)
HCT: 43.8 % (ref 36.0–46.0)
Hemoglobin: 14.9 g/dL (ref 12.0–15.0)
LYMPHS PCT: 29 % (ref 12.0–46.0)
Lymphs Abs: 2.1 10*3/uL (ref 0.7–4.0)
MCHC: 34 g/dL (ref 30.0–36.0)
MCV: 85.9 fl (ref 78.0–100.0)
Monocytes Absolute: 0.4 10*3/uL (ref 0.1–1.0)
Monocytes Relative: 5.7 % (ref 3.0–12.0)
NEUTROS PCT: 61.1 % (ref 43.0–77.0)
Neutro Abs: 4.3 10*3/uL (ref 1.4–7.7)
PLATELETS: 220 10*3/uL (ref 150.0–400.0)
RBC: 5.09 Mil/uL (ref 3.87–5.11)
RDW: 13.9 % (ref 11.5–15.5)
WBC: 7.1 10*3/uL (ref 4.0–10.5)

## 2015-01-20 LAB — LIPID PANEL
CHOLESTEROL: 169 mg/dL (ref 0–200)
HDL: 44.7 mg/dL (ref >39.00)
LDL CALC: 110 mg/dL — AB (ref 0–99)
NONHDL: 124.3
Total CHOL/HDL Ratio: 4
Triglycerides: 72 mg/dL (ref 0.0–149.0)
VLDL: 14.4 mg/dL (ref 0.0–40.0)

## 2015-01-20 LAB — BASIC METABOLIC PANEL
BUN: 16 mg/dL (ref 6–23)
CALCIUM: 9.4 mg/dL (ref 8.4–10.5)
CO2: 29 mEq/L (ref 19–32)
Chloride: 108 mEq/L (ref 96–112)
Creatinine, Ser: 0.83 mg/dL (ref 0.40–1.20)
GFR: 73.24 mL/min (ref >60.00)
Glucose, Bld: 86 mg/dL (ref 70–99)
POTASSIUM: 4.3 meq/L (ref 3.5–5.1)
Sodium: 142 mEq/L (ref 135–145)

## 2015-01-20 LAB — HEPATIC FUNCTION PANEL
ALBUMIN: 4.1 g/dL (ref 3.5–5.2)
ALT: 24 U/L (ref 0–35)
AST: 30 U/L (ref 0–37)
Alkaline Phosphatase: 76 U/L (ref 39–117)
BILIRUBIN TOTAL: 0.8 mg/dL (ref 0.2–1.2)
Bilirubin, Direct: 0.1 mg/dL (ref 0.0–0.3)
Total Protein: 6.9 g/dL (ref 6.0–8.3)

## 2015-01-22 LAB — URINE CULTURE: Colony Count: 70000

## 2015-02-02 ENCOUNTER — Ambulatory Visit (HOSPITAL_BASED_OUTPATIENT_CLINIC_OR_DEPARTMENT_OTHER)
Admission: RE | Admit: 2015-02-02 | Discharge: 2015-02-02 | Disposition: A | Discharge status: Home / Self Care | Payer: Medicare Other | Source: Ambulatory Visit | Attending: Family Medicine | Admitting: Family Medicine

## 2015-02-02 DIAGNOSIS — J189 Pneumonia, unspecified organism: Secondary | ICD-10-CM | POA: Insufficient documentation

## 2015-02-03 ENCOUNTER — Other Ambulatory Visit: Payer: Self-pay

## 2015-02-03 MED ORDER — LEVOFLOXACIN 500 MG PO TABS: 500.0000 mg | ORAL_TABLET | Freq: Every day | ORAL | Status: DC

## 2015-02-28 ENCOUNTER — Other Ambulatory Visit: Payer: Self-pay

## 2015-02-28 DIAGNOSIS — J189 Pneumonia, unspecified organism: Secondary | ICD-10-CM

## 2015-03-17 ENCOUNTER — Ambulatory Visit (HOSPITAL_BASED_OUTPATIENT_CLINIC_OR_DEPARTMENT_OTHER)
Admission: RE | Admit: 2015-03-17 | Discharge: 2015-03-17 | Disposition: A | Discharge status: Home / Self Care | Payer: Medicare Other | Source: Ambulatory Visit | Attending: Family Medicine | Admitting: Family Medicine

## 2015-03-17 DIAGNOSIS — J189 Pneumonia, unspecified organism: Secondary | ICD-10-CM | POA: Diagnosis not present

## 2015-03-20 ENCOUNTER — Encounter: Payer: Self-pay | Admitting: Family Medicine

## 2015-04-25 ENCOUNTER — Encounter: Payer: Self-pay | Admitting: Family Medicine

## 2015-04-25 ENCOUNTER — Ambulatory Visit (INDEPENDENT_AMBULATORY_CARE_PROVIDER_SITE_OTHER): Payer: Medicare Other | Admitting: Family Medicine

## 2015-04-25 ENCOUNTER — Ambulatory Visit (HOSPITAL_BASED_OUTPATIENT_CLINIC_OR_DEPARTMENT_OTHER)
Admission: RE | Admit: 2015-04-25 | Discharge: 2015-04-25 | Disposition: A | Discharge status: Home / Self Care | Payer: Medicare Other | Source: Ambulatory Visit | Attending: Family Medicine | Admitting: Family Medicine

## 2015-04-25 VITALS — BP 110/72 | HR 95 | Temp 97.9°F | Wt 165.6 lb

## 2015-04-25 VITALS — BP 130/90 | HR 106 | Ht 64.0 in | Wt 165.0 lb

## 2015-04-25 DIAGNOSIS — M25572 Pain in left ankle and joints of left foot: Secondary | ICD-10-CM

## 2015-04-25 DIAGNOSIS — M2540 Effusion, unspecified joint: Secondary | ICD-10-CM | POA: Diagnosis not present

## 2015-04-25 DIAGNOSIS — M7732 Calcaneal spur, left foot: Secondary | ICD-10-CM | POA: Insufficient documentation

## 2015-04-25 MED ORDER — PREDNISONE 10 MG PO TABS: ORAL_TABLET | ORAL | Status: DC

## 2015-04-25 NOTE — Patient Instructions (Signed)

## 2015-04-25 NOTE — Progress Notes (Signed)
Pre visit review using our clinic review tool, if applicable. No additional management support is needed unless otherwise documented below in the visit note. 

## 2015-04-25 NOTE — Patient Instructions (Signed)
This is most consistent with a gout flare given the amount of swelling, quick onset, lack of an injury.  Other consideration would be sinus tarsi syndrome which is treated with better arch support (something like dr. Zoe Lan active series). Prednisone dose pack for 6 days as directed. After you finish this you can take aleve 2 tabs twice a day with food if needed. Icing and elevation as needed for swelling. Your pain should improve before the swelling goes away (may take several weeks for the swelling to improve). Ankle brace is a consideration though you don't have instability - I wouldn't use this in your case. Follow up with me in 1 month. Have fun in [facility]!

## 2015-04-25 NOTE — Progress Notes (Signed)
Subjective:    Patient ID: Cynthia May, female    DOB: 01-10-1951, 64 y.o.   MRN: [id number]  HPI  Patient here with c/o L ankle pain x 3 weeks.  No known injury.  Pt states it feels good in am and starts to hurt as day goes on and then by the end of the day the pain is severe.  Difficult to walk on it then.    Past Medical History  Diagnosis Date  . Asthma   . Depression   . Environmental allergies   . HTN (hypertension)   . Thyroid disease   . Pancreatitis     Review of Systems  Constitutional: Negative for diaphoresis, appetite change, fatigue and unexpected weight change.  Eyes: Negative for pain, redness and visual disturbance.  Respiratory: Negative for cough, chest tightness, shortness of breath and wheezing.   Cardiovascular: Negative for chest pain, palpitations and leg swelling.  Endocrine: Negative for cold intolerance, heat intolerance, polydipsia, polyphagia and polyuria.  Genitourinary: Negative for dysuria, frequency and difficulty urinating.  Musculoskeletal: Positive for joint swelling and gait problem.  Neurological: Negative for dizziness, light-headedness, numbness and headaches.    Current Outpatient Prescriptions on File Prior to Visit  Medication Sig Dispense Refill  . alendronate (FOSAMAX) 70 MG tablet Take 1 tablet (70 mg total) by mouth once a week. 4 tablet 4  . ANUCORT-HC 25 MG suppository Place 1 suppository rectally as needed.  11  . clonazePAM (KLONOPIN) 0.5 MG tablet Take 1.5 mg by mouth daily.  4  . cycloSPORINE (RESTASIS) 0.05 % ophthalmic emulsion 1 drop 2 (two) times daily.    . DULoxetine (CYMBALTA) 60 MG capsule Take 1 capsule by mouth daily.  5  . levothyroxine (SYNTHROID, LEVOTHROID) 112 MCG tablet Take 1 tablet by mouth daily.  11  . lisinopril (PRINIVIL,ZESTRIL) 10 MG tablet Take 1 tablet by mouth daily.  3  . meclizine (ANTIVERT) 25 MG tablet Take 1 tablet (25 mg total) by mouth 3 (three) times daily as needed for dizziness.  30 tablet 0  . NUVIGIL 250 MG tablet Take 0.5 tablets by mouth daily.     No current facility-administered medications on file prior to visit.       Objective:    Physical Exam  Constitutional: She is oriented to person, place, and time. She appears well-developed and well-nourished.  HENT:  Head: Normocephalic and atraumatic.  Eyes: Conjunctivae and EOM are normal.  Neck: Normal range of motion. Neck supple. No JVD present. Carotid bruit is not present. No thyromegaly present.  Pulmonary/Chest: Breath sounds normal.  Musculoskeletal: She exhibits edema and tenderness.       Left ankle: She exhibits swelling. She exhibits normal range of motion, no ecchymosis, no deformity, no laceration and normal pulse. Tenderness. Lateral malleolus tenderness found.       Feet:  Neurological: She is alert and oriented to person, place, and time.  Psychiatric: She has a normal mood and affect.    BP 110/72 mmHg  Pulse 95  Temp(Src) 97.9 F (36.6 C) (Oral)  Wt 165 lb 9.6 oz (75.116 kg)  SpO2 98% Wt Readings from Last 3 Encounters:  04/25/15 165 lb (74.844 kg)  04/25/15 165 lb 9.6 oz (75.116 kg)  01/19/15 162 lb (73.483 kg)     Lab Results  Component Value Date   WBC 7.1 01/19/2015   HGB 14.9 01/19/2015   HCT 43.8 01/19/2015   PLT 220.0 01/19/2015   GLUCOSE 86 01/19/2015   CHOL  169 01/19/2015   TRIG 72.0 01/19/2015   HDL 44.70 01/19/2015   LDLCALC 110* 01/19/2015   ALT 24 01/19/2015   AST 30 01/19/2015   NA 142 01/19/2015   K 4.3 01/19/2015   CL 108 01/19/2015   CREATININE 0.83 01/19/2015   BUN 16 01/19/2015   CO2 29 01/19/2015       Assessment & Plan:   Problem List Items Addressed This Visit      Unprioritized   Left ankle pain - Primary    Because pt is leaving this afternoon on airplane will try to get pt seen by sport med today      Relevant Orders   DG Ankle Complete Left (Completed)   Ambulatory referral to Sports Medicine    Other Visit Diagnoses     Swollen joint        Relevant Orders    Basic metabolic panel    CBC with Differential/Platelet    Sedimentation rate    ANA    Rheumatoid factor    Ambulatory referral to Sports Medicine       I have discontinued Ms. Milone's albuterol, guaiFENesin-codeine, pneumococcal 13-valent conjugate vaccine, Tdap, and levofloxacin. I am also having her maintain her NUVIGIL, clonazePAM, DULoxetine, ANUCORT-HC, levothyroxine, lisinopril, cycloSPORINE, alendronate, and meclizine.  No orders of the defined types were placed in this encounter.     Garnet Koyanagi, DO

## 2015-04-26 DIAGNOSIS — M25572 Pain in left ankle and joints of left foot: Secondary | ICD-10-CM | POA: Insufficient documentation

## 2015-04-26 NOTE — Assessment & Plan Note (Signed)
most consistent with acute gout flare given lack of injury, swelling, tenderness, rapid onset.  No evidence cellulitis.  Encouraged wearing good arch supports, prednisone dose pack then aleve as needed.  Icing, elevation.  F/u in 1 month.

## 2015-04-26 NOTE — Progress Notes (Signed)
PCP and referred by: Garnet Koyanagi, DO  Subjective:   HPI: Patient is a 64 y.o. female here for left ankle swelling, pain.  Patient denies known injury or increase in activity level. She states about 3 weeks ago noticed lateral left ankle swelling and development of pain with certain motions of the ankle. Some warmth to it but no redness, fevers, chills, sweats. No prior issues with this ankle.  Past Medical History  Diagnosis Date  . Asthma   . Depression   . Environmental allergies   . HTN (hypertension)   . Thyroid disease   . Pancreatitis     Current Outpatient Prescriptions on File Prior to Visit  Medication Sig Dispense Refill  . alendronate (FOSAMAX) 70 MG tablet Take 1 tablet (70 mg total) by mouth once a week. 4 tablet 4  . ANUCORT-HC 25 MG suppository Place 1 suppository rectally as needed.  11  . clonazePAM (KLONOPIN) 0.5 MG tablet Take 1.5 mg by mouth daily.  4  . cycloSPORINE (RESTASIS) 0.05 % ophthalmic emulsion 1 drop 2 (two) times daily.    . DULoxetine (CYMBALTA) 60 MG capsule Take 1 capsule by mouth daily.  5  . levothyroxine (SYNTHROID, LEVOTHROID) 112 MCG tablet Take 1 tablet by mouth daily.  11  . lisinopril (PRINIVIL,ZESTRIL) 10 MG tablet Take 1 tablet by mouth daily.  3  . meclizine (ANTIVERT) 25 MG tablet Take 1 tablet (25 mg total) by mouth 3 (three) times daily as needed for dizziness. 30 tablet 0  . NUVIGIL 250 MG tablet Take 0.5 tablets by mouth daily.     No current facility-administered medications on file prior to visit.    Past Surgical History  Procedure Laterality Date  . Cholecystectomy    . Tonsillectomy and adenoidectomy      Allergies  Allergen Reactions  . Augmentin [Amoxicillin-Pot Clavulanate] Other (See Comments)    Flu like symptoms  . Erythromycin Other (See Comments)    Flu like symptoms    History   Social History  . Marital Status: Widowed    Spouse Name: N/A  . Number of Children: N/A  . Years of Education: N/A    Occupational History  . Not on file.   Social History Main Topics  . Smoking status: Never Smoker   . Smokeless tobacco: Never Used  . Alcohol Use: No  . Drug Use: No  . Sexual Activity: Not on file   Other Topics Concern  . Not on file   Social History Narrative    Family History  Problem Relation Age of Onset  . Arthritis Mother   . Colon cancer Brother   . Arthritis Sister   . Lung cancer Paternal Grandmother   . Prostate cancer Brother   . Heart disease Mother   . Heart disease Father   . Heart disease Brother   . Stroke Mother   . Depression Sister     5 silbling had depression from Thyroid disease   . Thyroid disease Mother     BP 130/90 mmHg  Pulse 106  Ht 5\' 4"  (1.626 m)  Wt 165 lb (74.844 kg)  BMI 28.31 kg/m2  Review of Systems: See HPI above.    Objective:  Physical Exam:  Gen: NAD  Left ankle: Lateral ankle swelling focally.  No bruising, other deformity.  Mod overpronation. FROM with 5/5 strength all directions. TTP over anterior ankle joint, ATFL only.  No other tenderness. Negative ant drawer and talar tilt.   Negative syndesmotic  compression. Thompsons test negative. NV intact distally.    Assessment & Plan:  1. Left ankle pain, swelling - most consistent with acute gout flare given lack of injury, swelling, tenderness, rapid onset.  No evidence cellulitis.  Encouraged wearing good arch supports, prednisone dose pack then aleve as needed.  Icing, elevation.  F/u in 1 month.

## 2015-04-26 NOTE — Assessment & Plan Note (Signed)
Because pt is leaving this afternoon on airplane will try to get pt seen by sport med today

## 2015-05-16 ENCOUNTER — Other Ambulatory Visit: Payer: Self-pay

## 2015-05-16 MED ORDER — LISINOPRIL 10 MG PO TABS: 10.0000 mg | ORAL_TABLET | Freq: Every day | ORAL | Status: DC

## 2015-06-18 ENCOUNTER — Other Ambulatory Visit: Payer: Self-pay | Admitting: Family Medicine

## 2015-07-07 ENCOUNTER — Ambulatory Visit (INDEPENDENT_AMBULATORY_CARE_PROVIDER_SITE_OTHER): Payer: Medicare Other | Admitting: Family Medicine

## 2015-07-07 ENCOUNTER — Encounter: Payer: Self-pay | Admitting: Family Medicine

## 2015-07-07 VITALS — BP 120/74 | HR 86 | Temp 97.9°F | Ht 64.0 in | Wt 168.8 lb

## 2015-07-07 DIAGNOSIS — M25572 Pain in left ankle and joints of left foot: Secondary | ICD-10-CM

## 2015-07-07 NOTE — Progress Notes (Signed)
Patient ID: Cynthia May, female    DOB: 1951/07/08  Age: 64 y.o. MRN: [id number]    Subjective:  Subjective HPI Cynthia May presents for L ankle pain.  No better.  She saw sport med but did not go for f/u.    Review of Systems  Constitutional: Negative for activity change, appetite change, fatigue and unexpected weight change.  Respiratory: Negative for cough and shortness of breath.   Cardiovascular: Negative for chest pain and palpitations.  Musculoskeletal: Positive for joint swelling, arthralgias and gait problem.  Psychiatric/Behavioral: Negative for behavioral problems and dysphoric mood. The patient is not nervous/anxious.     History Past Medical History  Diagnosis Date  . Asthma   . Depression   . Environmental allergies   . HTN (hypertension)   . Thyroid disease   . Pancreatitis     She has past surgical history that includes Cholecystectomy and Tonsillectomy and adenoidectomy.   Her family history includes Arthritis in her mother and sister; Colon cancer in her brother; Depression in her sister; Heart disease in her brother, father, and mother; Lung cancer in her paternal grandmother; Prostate cancer in her brother; Stroke in her mother; Thyroid disease in her mother.She reports that she has never smoked. She has never used smokeless tobacco. She reports that she does not drink alcohol or use illicit drugs.  Current Outpatient Prescriptions on File Prior to Visit  Medication Sig Dispense Refill  . alendronate (FOSAMAX) 70 MG tablet TAKE ONE TABLET BY MOUTH ONCE A WEEK 4 tablet 11  . ANUCORT-HC 25 MG suppository Place 1 suppository rectally as needed.  11  . clonazePAM (KLONOPIN) 0.5 MG tablet Take 1.5 mg by mouth daily.  4  . cycloSPORINE (RESTASIS) 0.05 % ophthalmic emulsion 1 drop 2 (two) times daily.    . DULoxetine (CYMBALTA) 60 MG capsule Take 1 capsule by mouth daily.  5  . levothyroxine (SYNTHROID, LEVOTHROID) 112 MCG tablet Take 1 tablet by  mouth daily.  11  . lisinopril (PRINIVIL,ZESTRIL) 10 MG tablet Take 1 tablet (10 mg total) by mouth daily. 90 tablet 1  . meclizine (ANTIVERT) 25 MG tablet Take 1 tablet (25 mg total) by mouth 3 (three) times daily as needed for dizziness. 30 tablet 0   No current facility-administered medications on file prior to visit.     Objective:  Objective Physical Exam  Constitutional: She appears well-developed and well-nourished.  Musculoskeletal: She exhibits edema and tenderness.       Left ankle: She exhibits decreased range of motion and swelling. Tenderness. Lateral malleolus tenderness found.   BP 120/74 mmHg  Pulse 86  Temp(Src) 97.9 F (36.6 C) (Oral)  Ht 5\' 4"  (1.626 m)  Wt 168 lb 12.8 oz (76.567 kg)  BMI 28.96 kg/m2  SpO2 96% Wt Readings from Last 3 Encounters:  07/07/15 168 lb 12.8 oz (76.567 kg)  04/25/15 165 lb (74.844 kg)  04/25/15 165 lb 9.6 oz (75.116 kg)     Lab Results  Component Value Date   WBC 7.1 01/19/2015   HGB 14.9 01/19/2015   HCT 43.8 01/19/2015   PLT 220.0 01/19/2015   GLUCOSE 86 01/19/2015   CHOL 169 01/19/2015   TRIG 72.0 01/19/2015   HDL 44.70 01/19/2015   LDLCALC 110* 01/19/2015   ALT 24 01/19/2015   AST 30 01/19/2015   NA 142 01/19/2015   K 4.3 01/19/2015   CL 108 01/19/2015   CREATININE 0.83 01/19/2015   BUN 16 01/19/2015   CO2 29 01/19/2015  Dg Ankle Complete Left  04/25/2015   CLINICAL DATA:  Left ankle pain for 3 weeks.  EXAM: LEFT ANKLE COMPLETE - 3+ VIEW  COMPARISON:  None.  FINDINGS: There is no evidence of fracture, dislocation, or joint effusion.  There is no evidence of arthropathy.  A small-moderate calcaneal spur is present.  Soft tissues are unremarkable.  IMPRESSION: No evidence of acute abnormality.  Calcaneal spur.   Electronically Signed   By: Margarette Canada M.D.   On: 04/25/2015 12:47     Assessment & Plan:  Plan I have discontinued Ms. Baisch's NUVIGIL and predniSONE. I am also having her maintain her clonazePAM,  DULoxetine, ANUCORT-HC, levothyroxine, cycloSPORINE, meclizine, lisinopril, and alendronate.  No orders of the defined types were placed in this encounter.    Problem List Items Addressed This Visit    Left ankle pain - Primary    Brace given to pt Pt will f/u with sport med next week         Follow-up: No Follow-up on file.  Garnet Koyanagi, DO

## 2015-07-07 NOTE — Progress Notes (Signed)
Pre visit review using our clinic review tool, if applicable. No additional management support is needed unless otherwise documented below in the visit note. 

## 2015-07-07 NOTE — Assessment & Plan Note (Signed)
Brace given to pt Pt will f/u with sport med next week

## 2015-07-07 NOTE — Patient Instructions (Signed)

## 2015-07-12 ENCOUNTER — Ambulatory Visit (INDEPENDENT_AMBULATORY_CARE_PROVIDER_SITE_OTHER): Payer: Medicare Other | Admitting: Family Medicine

## 2015-07-12 ENCOUNTER — Encounter: Payer: Self-pay | Admitting: Family Medicine

## 2015-07-12 ENCOUNTER — Encounter (INDEPENDENT_AMBULATORY_CARE_PROVIDER_SITE_OTHER): Payer: Self-pay

## 2015-07-12 VITALS — BP 139/85 | HR 102 | Ht 64.0 in | Wt 160.0 lb

## 2015-07-12 DIAGNOSIS — M25572 Pain in left ankle and joints of left foot: Secondary | ICD-10-CM | POA: Diagnosis not present

## 2015-07-12 NOTE — Patient Instructions (Signed)
You have sinus tarsi syndrome with peroneal tendinitis. Icing 15 minutes at a time 3-4 times a day. Wear ankle brace as often as possible including when sleeping. Avoid flat shoes and barefoot walking. Green insoles with scaphoid pads - switch between different shoes. If too uncomfortable you can remove the white scaphoid pads - the insoles themselves have arch support to them. Consider nitro patches, physical therapy, boot if not improving. If pain is at least 50% better before i see you you can start the home exercises with theraband. Follow up with me in 1 month.

## 2015-07-12 NOTE — Progress Notes (Signed)
PCP and referred by: Garnet Koyanagi, DO  Subjective:   HPI: Patient is a 64 y.o. female here for left ankle swelling, pain.  7/26: Patient denies known injury or increase in activity level. She states about 3 weeks ago noticed lateral left ankle swelling and development of pain with certain motions of the ankle. Some warmth to it but no redness, fevers, chills, sweats. No prior issues with this ankle.  10/12: Patient reports she has not improved since last visit. Pain level 3/10 at rest, up to 8/10 when sitting on this leg and at nighttime. Pain lateral ankle, sharp. Tried a laceup brace, finished prednisone. Worse with inversion as well. Getting numbness into all toes only on this foot. No skin changes, fever, other complaints.  Past Medical History  Diagnosis Date  . Asthma   . Depression   . Environmental allergies   . HTN (hypertension)   . Thyroid disease   . Pancreatitis     Current Outpatient Prescriptions on File Prior to Visit  Medication Sig Dispense Refill  . alendronate (FOSAMAX) 70 MG tablet TAKE ONE TABLET BY MOUTH ONCE A WEEK 4 tablet 11  . ANUCORT-HC 25 MG suppository Place 1 suppository rectally as needed.  11  . clonazePAM (KLONOPIN) 0.5 MG tablet Take 1.5 mg by mouth daily.  4  . cycloSPORINE (RESTASIS) 0.05 % ophthalmic emulsion 1 drop 2 (two) times daily.    . DULoxetine (CYMBALTA) 60 MG capsule Take 1 capsule by mouth daily.  5  . levothyroxine (SYNTHROID, LEVOTHROID) 112 MCG tablet Take 1 tablet by mouth daily.  11  . lisinopril (PRINIVIL,ZESTRIL) 10 MG tablet Take 1 tablet (10 mg total) by mouth daily. 90 tablet 1  . meclizine (ANTIVERT) 25 MG tablet Take 1 tablet (25 mg total) by mouth 3 (three) times daily as needed for dizziness. 30 tablet 0   No current facility-administered medications on file prior to visit.    Past Surgical History  Procedure Laterality Date  . Cholecystectomy    . Tonsillectomy and adenoidectomy      Allergies   Allergen Reactions  . Augmentin [Amoxicillin-Pot Clavulanate] Other (See Comments)    Flu like symptoms  . Erythromycin Other (See Comments)    Flu like symptoms    Social History   Social History  . Marital Status: Widowed    Spouse Name: N/A  . Number of Children: N/A  . Years of Education: N/A   Occupational History  . Not on file.   Social History Main Topics  . Smoking status: Never Smoker   . Smokeless tobacco: Never Used  . Alcohol Use: No  . Drug Use: No  . Sexual Activity: Not on file   Other Topics Concern  . Not on file   Social History Narrative    Family History  Problem Relation Age of Onset  . Arthritis Mother   . Colon cancer Brother   . Arthritis Sister   . Lung cancer Paternal Grandmother   . Prostate cancer Brother   . Heart disease Mother   . Heart disease Father   . Heart disease Brother   . Stroke Mother   . Depression Sister     5 silbling had depression from Thyroid disease   . Thyroid disease Mother     BP 139/85 mmHg  Pulse 102  Ht 5\' 4"  (1.626 m)  Wt 160 lb (72.576 kg)  BMI 27.45 kg/m2  Review of Systems: See HPI above.    Objective:  Physical  Exam:  Gen: NAD  Left ankle: Lateral ankle swelling focally.  No bruising, other deformity.  Mod overpronation. FROM with 5/5 strength all directions. TTP over sinus tarsi, ATFL, peroneal tendons.  No other tenderness. Negative ant drawer and talar tilt.   Negative syndesmotic compression. Thompsons test negative. NV intact distally.  Right ankle: FROM without pain.  MSK u/s:  Target sign of left peroneal tendons in area of pain.  No peroneal tendon tears.      Assessment & Plan:  1. Left ankle pain, swelling - Mild improvement with prednisone and pain now localized to both the sinus tarsi and the peroneal tendons.  MSK u/s reassuring.  Treat for sinus tarsi syndrome and peroneal tendinitis.  Icing, sports insoles with scaphoid pads.  Avoid flat shoes, barefoot walking.   Shown home exercises she could start if pain reduces to 1-2/10.  Consider nitro patches, PT, boot if not improving.  F/u in 1 month.

## 2015-07-12 NOTE — Assessment & Plan Note (Signed)
Mild improvement with prednisone and pain now localized to both the sinus tarsi and the peroneal tendons.  MSK u/s reassuring.  Treat for sinus tarsi syndrome and peroneal tendinitis.  Icing, sports insoles with scaphoid pads.  Avoid flat shoes, barefoot walking.  Shown home exercises she could start if pain reduces to 1-2/10.  Consider nitro patches, PT, boot if not improving.  F/u in 1 month.

## 2015-08-11 ENCOUNTER — Ambulatory Visit: Payer: Medicare Other | Admitting: Family Medicine

## 2015-10-13 ENCOUNTER — Telehealth: Payer: Self-pay | Admitting: Family Medicine

## 2015-10-13 DIAGNOSIS — Z1231 Encounter for screening mammogram for malignant neoplasm of breast: Secondary | ICD-10-CM

## 2015-10-13 DIAGNOSIS — E2839 Other primary ovarian failure: Secondary | ICD-10-CM

## 2015-10-13 DIAGNOSIS — M810 Age-related osteoporosis without current pathological fracture: Secondary | ICD-10-CM

## 2015-10-13 NOTE — Telephone Encounter (Signed)
Pt called stating she is due for mammogram and received letter from [facility] on [address] in [facility] that she may need a bone density. Pt asking if needed to send order for bone density & she would like to go ahead with the mammogram regardless. Let her know.

## 2015-10-13 NOTE — Addendum Note (Signed)
Addended by: Ewing Schlein on: 10/13/2015 11:00 AM   Modules accepted: Orders

## 2015-10-13 NOTE — Telephone Encounter (Signed)
Notified pt. She will call and schedule appt.

## 2015-10-13 NOTE — Telephone Encounter (Signed)
The order have been faxed for both.     KP

## 2015-10-17 NOTE — Telephone Encounter (Signed)
Pt asked that we refax the orders. She said they told her they don't have anything. [facility] Ph# [phone] Fax# [phone] Pt asked that we call to conf they receive the orders.

## 2015-10-17 NOTE — Telephone Encounter (Signed)
Confirmation received      KP

## 2015-10-25 ENCOUNTER — Telehealth: Payer: Self-pay | Admitting: Family Medicine

## 2015-10-25 NOTE — Telephone Encounter (Signed)
LM to schedule CPE (last 01/07/15) and to update info on flu shot

## 2015-10-26 ENCOUNTER — Telehealth: Payer: Self-pay | Admitting: Family Medicine

## 2015-10-26 LAB — HM MAMMOGRAPHY

## 2015-10-26 NOTE — Telephone Encounter (Signed)
Pt says that she has had her flu vac at the [facility] in October.

## 2015-11-09 ENCOUNTER — Telehealth: Payer: Self-pay | Admitting: *Deleted

## 2015-11-09 NOTE — Telephone Encounter (Signed)
Unable to reach patient at time of pre-visit call. Left message for patient to return call when available.  

## 2015-11-10 ENCOUNTER — Ambulatory Visit (INDEPENDENT_AMBULATORY_CARE_PROVIDER_SITE_OTHER): Payer: Medicare Other | Admitting: Family Medicine

## 2015-11-10 ENCOUNTER — Encounter: Payer: Self-pay | Admitting: Family Medicine

## 2015-11-10 VITALS — BP 126/62 | HR 71 | Temp 98.2°F | Ht 64.0 in | Wt 162.0 lb

## 2015-11-10 DIAGNOSIS — I10 Essential (primary) hypertension: Secondary | ICD-10-CM

## 2015-11-10 DIAGNOSIS — E039 Hypothyroidism, unspecified: Secondary | ICD-10-CM | POA: Diagnosis not present

## 2015-11-10 DIAGNOSIS — Z0000 Encounter for general adult medical examination without abnormal findings: Secondary | ICD-10-CM

## 2015-11-10 LAB — COMPREHENSIVE METABOLIC PANEL
ALBUMIN: 4.2 g/dL (ref 3.6–5.1)
ALK PHOS: 72 U/L (ref 33–130)
ALT: 10 U/L (ref 6–29)
AST: 19 U/L (ref 10–35)
BILIRUBIN TOTAL: 1.1 mg/dL (ref 0.2–1.2)
BUN: 15 mg/dL (ref 7–25)
CO2: 25 mmol/L (ref 20–31)
CREATININE: 0.84 mg/dL (ref 0.50–0.99)
Calcium: 9.7 mg/dL (ref 8.6–10.4)
Chloride: 104 mmol/L (ref 98–110)
Glucose, Bld: 88 mg/dL (ref 65–99)
Potassium: 4.4 mmol/L (ref 3.5–5.3)
SODIUM: 141 mmol/L (ref 135–146)
TOTAL PROTEIN: 6.7 g/dL (ref 6.1–8.1)

## 2015-11-10 LAB — POCT URINALYSIS DIPSTICK
BILIRUBIN UA: NEGATIVE
GLUCOSE UA: NEGATIVE
KETONES UA: NEGATIVE
Leukocytes, UA: NEGATIVE
NITRITE UA: NEGATIVE
Protein, UA: NEGATIVE
RBC UA: NEGATIVE
SPEC GRAV UA: 1.025
Urobilinogen, UA: NEGATIVE
pH, UA: 6

## 2015-11-10 LAB — CBC WITH DIFFERENTIAL/PLATELET
BASOS ABS: 0.1 10*3/uL (ref 0.0–0.1)
BASOS PCT: 1 % (ref 0–1)
EOS ABS: 0.4 10*3/uL (ref 0.0–0.7)
Eosinophils Relative: 5 % (ref 0–5)
HCT: 47.4 % — ABNORMAL HIGH (ref 36.0–46.0)
HEMOGLOBIN: 15.9 g/dL — AB (ref 12.0–15.0)
Lymphocytes Relative: 30 % (ref 12–46)
Lymphs Abs: 2.6 10*3/uL (ref 0.7–4.0)
MCH: 29.3 pg (ref 26.0–34.0)
MCHC: 33.5 g/dL (ref 30.0–36.0)
MCV: 87.5 fL (ref 78.0–100.0)
MPV: 10.1 fL (ref 8.6–12.4)
Monocytes Absolute: 0.6 10*3/uL (ref 0.1–1.0)
Monocytes Relative: 7 % (ref 3–12)
Neutro Abs: 4.9 10*3/uL (ref 1.7–7.7)
Neutrophils Relative %: 57 % (ref 43–77)
PLATELETS: 237 10*3/uL (ref 150–400)
RBC: 5.42 MIL/uL — ABNORMAL HIGH (ref 3.87–5.11)
RDW: 14.3 % (ref 11.5–15.5)
WBC: 8.6 10*3/uL (ref 4.0–10.5)

## 2015-11-10 LAB — LIPID PANEL
CHOL/HDL RATIO: 4.1 ratio (ref <=5.0)
Cholesterol: 212 mg/dL — ABNORMAL HIGH (ref 125–200)
HDL: 52 mg/dL (ref >=46)
LDL Cholesterol: 136 mg/dL — ABNORMAL HIGH (ref <130)
Triglycerides: 119 mg/dL (ref <150)
VLDL: 24 mg/dL (ref <30)

## 2015-11-10 LAB — TSH: TSH: 2.04 mIU/L

## 2015-11-10 NOTE — Progress Notes (Signed)
Subjective:   Cynthia May is a 65 y.o. female who presents for an Initial Medicare Annual Wellness Visit.  Review of Systems     Review of Systems  Constitutional: Negative for activity change, appetite change and fatigue.  HENT: Negative for hearing loss, congestion, tinnitus and ear discharge.   Eyes: Negative for visual disturbance (see optho q1y -- vision corrected to 20/20 with glasses).  Respiratory: Negative for cough, chest tightness and shortness of breath.   Cardiovascular: Negative for chest pain, palpitations and leg swelling.  Gastrointestinal: Negative for abdominal pain, diarrhea, constipation and abdominal distention.  Genitourinary: Negative for urgency, frequency, decreased urine volume and difficulty urinating.  Musculoskeletal: Negative for back pain, arthralgias and gait problem.  Skin: Negative for color change, pallor and rash.  Neurological: Negative for dizziness, light-headedness, numbness and headaches.  Hematological: Negative for adenopathy. Does not bruise/bleed easily.  Psychiatric/Behavioral: Negative for suicidal ideas, confusion, sleep disturbance, self-injury, dysphoric mood, decreased concentration and agitation.  Pt is able to read and write and can do all ADLs No risk for falling No abuse/ violence in home     Cardiac Risk Factors include: advanced age (>19mn, >>56women)     Objective:    Today's Vitals   11/10/15 1441  BP: 126/62  Pulse: 71  Temp: 98.2 F (36.8 C)  TempSrc: Oral  Height: _0  (1.626 m)  Weight: 162 lb (73.483 kg)  SpO2: 97%    Current Medications (verified) Outpatient Encounter Prescriptions as of 11/10/2015  Medication Sig  . alendronate (FOSAMAX) 70 MG tablet TAKE ONE TABLET BY MOUTH ONCE A WEEK  . ANUCORT-HC 25 MG suppository Place 1 suppository rectally as needed.  . cycloSPORINE (RESTASIS) 0.05 % ophthalmic emulsion 1 drop 2 (two) times daily.  . DULoxetine (CYMBALTA) 60 MG capsule Take 1  capsule by mouth daily.  .Marland Kitchenlevothyroxine (SYNTHROID, LEVOTHROID) 112 MCG tablet Take 1 tablet by mouth daily.  .Marland Kitchenlisinopril (PRINIVIL,ZESTRIL) 10 MG tablet Take 1 tablet (10 mg total) by mouth daily.  . meclizine (ANTIVERT) 25 MG tablet Take 1 tablet (25 mg total) by mouth 3 (three) times daily as needed for dizziness.  . traZODone (DESYREL) 50 MG tablet Take 1 tablet by mouth. 3 days a week  . [DISCONTINUED] clonazePAM (KLONOPIN) 0.5 MG tablet Take 1.5 mg by mouth daily.   No facility-administered encounter medications on file as of 11/10/2015.    Allergies (verified) Augmentin and Erythromycin   History: Past Medical History  Diagnosis Date  . Asthma   . Depression   . Environmental allergies   . HTN (hypertension)   . Thyroid disease   . Pancreatitis    Past Surgical History  Procedure Laterality Date  . Cholecystectomy    . Tonsillectomy and adenoidectomy     Family History  Problem Relation Age of Onset  . Arthritis Mother   . Colon cancer Brother   . Arthritis Sister   . Lung cancer Paternal Grandmother   . Prostate cancer Brother   . Heart disease Mother   . Heart disease Father   . Heart disease Brother   . Stroke Mother   . Depression Sister     5 silbling had depression from Thyroid disease   . Thyroid disease Mother    Social History   Occupational History  . Not on file.   Social History Main Topics  . Smoking status: Never Smoker   . Smokeless tobacco: Never Used  . Alcohol Use: No  . Drug  Use: No  . Sexual Activity: Not on file    Tobacco Counseling Counseling given: Not Answered   Activities of Daily Living In your present state of health, do you have any difficulty performing the following activities: 11/10/2015 11/10/2015  Hearing? N N  Vision? N N  Difficulty concentrating or making decisions? N Y  Walking or climbing stairs? - N  Dressing or bathing? N N  Doing errands, shopping? N N  Preparing Food and eating ? N -  Using the  Toilet? N -  In the past six months, have you accidently leaked urine? N -  Do you have problems with loss of bowel control? N -  Managing your Medications? N -  Managing your Finances? N -  Housekeeping or managing your Housekeeping? N -    Immunizations and Health Maintenance Immunization History  Administered Date(s) Administered  . Influenza-Unspecified 07/15/2014, 07/01/2015  . Pneumococcal Conjugate-13 01/19/2015  . Tdap 01/19/2015  . Zoster 07/11/2010   Health Maintenance Due  Topic Date Due  . Hepatitis C Screening  30-Nov-1950    Patient Care Team: Rosalita Chessman, DO as PCP - General (Family Medicine) Lendon Colonel, MD as Referring Physician (Psychiatry) Amalia Greenhouse, MD as Referring Physician (Endocrinology) Lang Snow, MD as Referring Physician (Gynecology) Karoline Caldwell, OD as Referring Physician (Optometry)  Indicate any recent Medical Services you may have received from other than [facility] providers in the past year (date may be approximate).     Assessment:   This is a routine wellness examination for Cynthia May.   Hearing/Vision screen Hearing Screening Comments: Whisper -- normal Vision Screening Comments: Per opth  Dietary issues and exercise activities discussed: Current Exercise Habits:: The patient does not participate in regular exercise at present  Goals    None     Depression Screen PHQ 2/9 Scores 11/10/2015 11/10/2015 01/05/2015  PHQ - 2 Score 0 0 0    Fall Risk Fall Risk  11/10/2015 11/10/2015 01/05/2015  Falls in the past year? No No No    Cognitive Function: MMSE - Mini Mental State Exam 11/10/2015  Orientation to time 5  Orientation to Place 5  Registration 3  Attention/ Calculation 5  Recall 3  Language- name 2 objects 2  Language- repeat 1  Language- follow 3 step command 3  Language- read & follow direction 1  Write a sentence 1  Copy design 1  Total score 30    Screening Tests Health Maintenance  Topic Date Due  . Hepatitis C  Screening  1951-09-16  . PNA vac Low Risk Adult (2 of 2 - PPSV23) 01/19/2016  . INFLUENZA VACCINE  04/30/2016  . MAMMOGRAM  10/25/2017  . COLONOSCOPY  07/16/2021  . TETANUS/TDAP  01/18/2025  . DEXA SCAN  Completed  . ZOSTAVAX  Completed      Plan:    see AVS  During the course of the visit, Destaney was educated and counseled about the following appropriate screening and preventive services:   Vaccines to include Pneumoccal, Influenza, Hepatitis B, Td, Zostavax, HCV  Electrocardiogram  Cardiovascular disease screening  Colorectal cancer screening  Bone density screening  Diabetes screening  Glaucoma screening  Mammography/PAP  Nutrition counseling  Smoking cessation counseling  Patient Instructions (the written plan) were given to the patient.   1. Routine history and physical examination of adult See above  2. Hypothyroidism, unspecified hypothyroidism type   - Comp Met (CMET) - CBC with Differential/Platelet - Lipid panel - POCT urinalysis dipstick - TSH -  Hepatitis C antibody  3. Essential hypertension   - Comp Met (CMET) - CBC with Differential/Platelet - Lipid panel - POCT urinalysis dipstick - TSH - Hepatitis C antibody   Garnet Koyanagi, DO   11/10/2015

## 2015-11-10 NOTE — Patient Instructions (Signed)
Take Calcium 1200 mg daily with 1000u vita D3 --- we will recheck the bone density in 2 years     Preventive Care for Adults, Female A healthy lifestyle and preventive care can promote health and wellness. Preventive health guidelines for women include the following key practices.  A routine yearly physical is a good way to check with your health care provider about your health and preventive screening. It is a chance to share any concerns and updates on your health and to receive a thorough exam.  Visit your dentist for a routine exam and preventive care every 6 months. Brush your teeth twice a day and floss once a day. Good oral hygiene prevents tooth decay and gum disease.  The frequency of eye exams is based on your age, health, family medical history, use of contact lenses, and other factors. Follow your health care provider's recommendations for frequency of eye exams.  Eat a healthy diet. Foods like vegetables, fruits, whole grains, low-fat dairy products, and lean protein foods contain the nutrients you need without too many calories. Decrease your intake of foods high in solid fats, added sugars, and salt. Eat the right amount of calories for you.Get information about a proper diet from your health care provider, if necessary.  Regular physical exercise is one of the most important things you can do for your health. Most adults should get at least 150 minutes of moderate-intensity exercise (any activity that increases your heart rate and causes you to sweat) each week. In addition, most adults need muscle-strengthening exercises on 2 or more days a week.  Maintain a healthy weight. The body mass index (BMI) is a screening tool to identify possible weight problems. It provides an estimate of body fat based on height and weight. Your health care provider can find your BMI and can help you achieve or maintain a healthy weight.For adults 20 years and older:  A BMI below 18.5 is  considered underweight.  A BMI of 18.5 to 24.9 is normal.  A BMI of 25 to 29.9 is considered overweight.  A BMI of 30 and above is considered obese.  Maintain normal blood lipids and cholesterol levels by exercising and minimizing your intake of saturated fat. Eat a balanced diet with plenty of fruit and vegetables. Blood tests for lipids and cholesterol should begin at age 37 and be repeated every 5 years. If your lipid or cholesterol levels are high, you are over 50, or you are at high risk for heart disease, you may need your cholesterol levels checked more frequently.Ongoing high lipid and cholesterol levels should be treated with medicines if diet and exercise are not working.  If you smoke, find out from your health care provider how to quit. If you do not use tobacco, do not start.  Lung cancer screening is recommended for adults aged 81-80 years who are at high risk for developing lung cancer because of a history of smoking. A yearly low-dose CT scan of the lungs is recommended for people who have at least a 30-pack-year history of smoking and are a current smoker or have quit within the past 15 years. A pack year of smoking is smoking an average of 1 pack of cigarettes a day for 1 year (for example: 1 pack a day for 30 years or 2 packs a day for 15 years). Yearly screening should continue until the smoker has stopped smoking for at least 15 years. Yearly screening should be stopped for people who develop a  health problem that would prevent them from having lung cancer treatment.  If you are pregnant, do not drink alcohol. If you are breastfeeding, be very cautious about drinking alcohol. If you are not pregnant and choose to drink alcohol, do not have more than 1 drink per day. One drink is considered to be 12 ounces (355 mL) of beer, 5 ounces (148 mL) of wine, or 1.5 ounces (44 mL) of liquor.  Avoid use of street drugs. Do not share needles with anyone. Ask for help if you need support or  instructions about stopping the use of drugs.  High blood pressure causes heart disease and increases the risk of stroke. Your blood pressure should be checked at least every 1 to 2 years. Ongoing high blood pressure should be treated with medicines if weight loss and exercise do not work.  If you are 16-7 years old, ask your health care provider if you should take aspirin to prevent strokes.  Diabetes screening is done by taking a blood sample to check your blood glucose level after you have not eaten for a certain period of time (fasting). If you are not overweight and you do not have risk factors for diabetes, you should be screened once every 3 years starting at age 15. If you are overweight or obese and you are 55-22 years of age, you should be screened for diabetes every year as part of your cardiovascular risk assessment.  Breast cancer screening is essential preventive care for women. You should practice "breast self-awareness." This means understanding the normal appearance and feel of your breasts and may include breast self-examination. Any changes detected, no matter how small, should be reported to a health care provider. Women in their 10s and 30s should have a clinical breast exam (CBE) by a health care provider as part of a regular health exam every 1 to 3 years. After age 76, women should have a CBE every year. Starting at age 89, women should consider having a mammogram (breast X-ray test) every year. Women who have a family history of breast cancer should talk to their health care provider about genetic screening. Women at a high risk of breast cancer should talk to their health care providers about having an MRI and a mammogram every year.  Breast cancer gene (BRCA)-related cancer risk assessment is recommended for women who have family members with BRCA-related cancers. BRCA-related cancers include breast, ovarian, tubal, and peritoneal cancers. Having family members with these  cancers may be associated with an increased risk for harmful changes (mutations) in the breast cancer genes BRCA1 and BRCA2. Results of the assessment will determine the need for genetic counseling and BRCA1 and BRCA2 testing.  Your health care provider may recommend that you be screened regularly for cancer of the pelvic organs (ovaries, uterus, and vagina). This screening involves a pelvic examination, including checking for microscopic changes to the surface of your cervix (Pap test). You may be encouraged to have this screening done every 3 years, beginning at age 41.  For women ages 23-65, health care providers may recommend pelvic exams and Pap testing every 3 years, or they may recommend the Pap and pelvic exam, combined with testing for human papilloma virus (HPV), every 5 years. Some types of HPV increase your risk of cervical cancer. Testing for HPV may also be done on women of any age with unclear Pap test results.  Other health care providers may not recommend any screening for nonpregnant women who are considered low  risk for pelvic cancer and who do not have symptoms. Ask your health care provider if a screening pelvic exam is right for you.  If you have had past treatment for cervical cancer or a condition that could lead to cancer, you need Pap tests and screening for cancer for at least 20 years after your treatment. If Pap tests have been discontinued, your risk factors (such as having a new sexual partner) need to be reassessed to determine if screening should resume. Some women have medical problems that increase the chance of getting cervical cancer. In these cases, your health care provider may recommend more frequent screening and Pap tests.  Colorectal cancer can be detected and often prevented. Most routine colorectal cancer screening begins at the age of 41 years and continues through age 69 years. However, your health care provider may recommend screening at an earlier age if you  have risk factors for colon cancer. On a yearly basis, your health care provider may provide home test kits to check for hidden blood in the stool. Use of a small camera at the end of a tube, to directly examine the colon (sigmoidoscopy or colonoscopy), can detect the earliest forms of colorectal cancer. Talk to your health care provider about this at age 47, when routine screening begins. Direct exam of the colon should be repeated every 5-10 years through age 88 years, unless early forms of precancerous polyps or small growths are found.  People who are at an increased risk for hepatitis B should be screened for this virus. You are considered at high risk for hepatitis B if:  You were born in a country where hepatitis B occurs often. Talk with your health care provider about which countries are considered high risk.  Your parents were born in a high-risk country and you have not received a shot to protect against hepatitis B (hepatitis B vaccine).  You have HIV or AIDS.  You use needles to inject street drugs.  You live with, or have sex with, someone who has hepatitis B.  You get hemodialysis treatment.  You take certain medicines for conditions like cancer, organ transplantation, and autoimmune conditions.  Hepatitis C blood testing is recommended for all people born from [birth year] through 1965 and any individual with known risks for hepatitis C.  Practice safe sex. Use condoms and avoid high-risk sexual practices to reduce the spread of sexually transmitted infections (STIs). STIs include gonorrhea, chlamydia, syphilis, trichomonas, herpes, HPV, and human immunodeficiency virus (HIV). Herpes, HIV, and HPV are viral illnesses that have no cure. They can result in disability, cancer, and death.  You should be screened for sexually transmitted illnesses (STIs) including gonorrhea and chlamydia if:  You are sexually active and are younger than 24 years.  You are older than 24 years and your  health care provider tells you that you are at risk for this type of infection.  Your sexual activity has changed since you were last screened and you are at an increased risk for chlamydia or gonorrhea. Ask your health care provider if you are at risk.  If you are at risk of being infected with HIV, it is recommended that you take a prescription medicine daily to prevent HIV infection. This is called preexposure prophylaxis (PrEP). You are considered at risk if:  You are sexually active and do not regularly use condoms or know the HIV status of your partner(s).  You take drugs by injection.  You are sexually active with a partner  who has HIV.  Talk with your health care provider about whether you are at high risk of being infected with HIV. If you choose to begin PrEP, you should first be tested for HIV. You should then be tested every 3 months for as long as you are taking PrEP.  Osteoporosis is a disease in which the bones lose minerals and strength with aging. This can result in serious bone fractures or breaks. The risk of osteoporosis can be identified using a bone density scan. Women ages 51 years and over and women at risk for fractures or osteoporosis should discuss screening with their health care providers. Ask your health care provider whether you should take a calcium supplement or vitamin D to reduce the rate of osteoporosis.  Menopause can be associated with physical symptoms and risks. Hormone replacement therapy is available to decrease symptoms and risks. You should talk to your health care provider about whether hormone replacement therapy is right for you.  Use sunscreen. Apply sunscreen liberally and repeatedly throughout the day. You should seek shade when your shadow is shorter than you. Protect yourself by wearing long sleeves, pants, a wide-brimmed hat, and sunglasses year round, whenever you are outdoors.  Once a month, do a whole body skin exam, using a mirror to look  at the skin on your back. Tell your health care provider of new moles, moles that have irregular borders, moles that are larger than a pencil eraser, or moles that have changed in shape or color.  Stay current with required vaccines (immunizations).  Influenza vaccine. All adults should be immunized every year.  Tetanus, diphtheria, and acellular pertussis (Td, Tdap) vaccine. Pregnant women should receive 1 dose of Tdap vaccine during each pregnancy. The dose should be obtained regardless of the length of time since the last dose. Immunization is preferred during the 27th-36th week of gestation. An adult who has not previously received Tdap or who does not know her vaccine status should receive 1 dose of Tdap. This initial dose should be followed by tetanus and diphtheria toxoids (Td) booster doses every 10 years. Adults with an unknown or incomplete history of completing a 3-dose immunization series with Td-containing vaccines should begin or complete a primary immunization series including a Tdap dose. Adults should receive a Td booster every 10 years.  Varicella vaccine. An adult without evidence of immunity to varicella should receive 2 doses or a second dose if she has previously received 1 dose. Pregnant females who do not have evidence of immunity should receive the first dose after pregnancy. This first dose should be obtained before leaving the health care facility. The second dose should be obtained 4-8 weeks after the first dose.  Human papillomavirus (HPV) vaccine. Females aged 13-26 years who have not received the vaccine previously should obtain the 3-dose series. The vaccine is not recommended for use in pregnant females. However, pregnancy testing is not needed before receiving a dose. If a female is found to be pregnant after receiving a dose, no treatment is needed. In that case, the remaining doses should be delayed until after the pregnancy. Immunization is recommended for any person  with an immunocompromised condition through the age of 57 years if she did not get any or all doses earlier. During the 3-dose series, the second dose should be obtained 4-8 weeks after the first dose. The third dose should be obtained 24 weeks after the first dose and 16 weeks after the second dose.  Zoster vaccine. One dose  is recommended for adults aged 57 years or older unless certain conditions are present.  Measles, mumps, and rubella (MMR) vaccine. Adults born before [birth year] generally are considered immune to measles and mumps. Adults born in [birth year] or later should have 1 or more doses of MMR vaccine unless there is a contraindication to the vaccine or there is laboratory evidence of immunity to each of the three diseases. A routine second dose of MMR vaccine should be obtained at least 28 days after the first dose for students attending postsecondary schools, health care workers, or international travelers. People who received inactivated measles vaccine or an unknown type of measles vaccine during 1963-1967 should receive 2 doses of MMR vaccine. People who received inactivated mumps vaccine or an unknown type of mumps vaccine before 1979 and are at high risk for mumps infection should consider immunization with 2 doses of MMR vaccine. For females of childbearing age, rubella immunity should be determined. If there is no evidence of immunity, females who are not pregnant should be vaccinated. If there is no evidence of immunity, females who are pregnant should delay immunization until after pregnancy. Unvaccinated health care workers born before [birth year] who lack laboratory evidence of measles, mumps, or rubella immunity or laboratory confirmation of disease should consider measles and mumps immunization with 2 doses of MMR vaccine or rubella immunization with 1 dose of MMR vaccine.  Pneumococcal 13-valent conjugate (PCV13) vaccine. When indicated, a person who is uncertain of his immunization history and has  no record of immunization should receive the PCV13 vaccine. All adults 68 years of age and older should receive this vaccine. An adult aged 67 years or older who has certain medical conditions and has not been previously immunized should receive 1 dose of PCV13 vaccine. This PCV13 should be followed with a dose of pneumococcal polysaccharide (PPSV23) vaccine. Adults who are at high risk for pneumococcal disease should obtain the PPSV23 vaccine at least 8 weeks after the dose of PCV13 vaccine. Adults older than 67 years of age who have normal immune system function should obtain the PPSV23 vaccine dose at least 1 year after the dose of PCV13 vaccine.  Pneumococcal polysaccharide (PPSV23) vaccine. When PCV13 is also indicated, PCV13 should be obtained first. All adults aged 9 years and older should be immunized. An adult younger than age 68 years who has certain medical conditions should be immunized. Any person who resides in a nursing home or long-term care facility should be immunized. An adult smoker should be immunized. People with an immunocompromised condition and certain other conditions should receive both PCV13 and PPSV23 vaccines. People with human immunodeficiency virus (HIV) infection should be immunized as soon as possible after diagnosis. Immunization during chemotherapy or radiation therapy should be avoided. Routine use of PPSV23 vaccine is not recommended for American Indians, [place]atives, or people younger than 65 years unless there are medical conditions that require PPSV23 vaccine. When indicated, people who have unknown immunization and have no record of immunization should receive PPSV23 vaccine. One-time revaccination 5 years after the first dose of PPSV23 is recommended for people aged 19-64 years who have chronic kidney failure, nephrotic syndrome, asplenia, or immunocompromised conditions. People who received 1-2 doses of PPSV23 before age 44 years should receive another dose of PPSV23  vaccine at age 2 years or later if at least 5 years have passed since the previous dose. Doses of PPSV23 are not needed for people immunized with PPSV23 at or after age 72 years.  Meningococcal vaccine.  Adults with asplenia or persistent complement component deficiencies should receive 2 doses of quadrivalent meningococcal conjugate (MenACWY-D) vaccine. The doses should be obtained at least 2 months apart. Microbiologists working with certain meningococcal bacteria, [place] recruits, people at risk during an outbreak, and people who travel to or live in countries with a high rate of meningitis should be immunized. A first-year college student up through age 51 years who is living in a residence hall should receive a dose if she did not receive a dose on or after her 16th birthday. Adults who have certain high-risk conditions should receive one or more doses of vaccine.  Hepatitis A vaccine. Adults who wish to be protected from this disease, have certain high-risk conditions, work with hepatitis A-infected animals, work in hepatitis A research labs, or travel to or work in countries with a high rate of hepatitis A should be immunized. Adults who were previously unvaccinated and who anticipate close contact with an international adoptee during the first 60 days after arrival in the [place] from a country with a high rate of hepatitis A should be immunized.  Hepatitis B vaccine. Adults who wish to be protected from this disease, have certain high-risk conditions, may be exposed to blood or other infectious body fluids, are household contacts or sex partners of hepatitis B positive people, are clients or workers in certain care facilities, or travel to or work in countries with a high rate of hepatitis B should be immunized.  Haemophilus influenzae type b (Hib) vaccine. A previously unvaccinated person with asplenia or sickle cell disease or having a scheduled splenectomy should receive 1 dose of Hib  vaccine. Regardless of previous immunization, a recipient of a hematopoietic stem cell transplant should receive a 3-dose series 6-12 months after her successful transplant. Hib vaccine is not recommended for adults with HIV infection. Preventive Services / Frequency Ages 39 to 3 years  Blood pressure check.** / Every 3-5 years.  Lipid and cholesterol check.** / Every 5 years beginning at age 46.  Clinical breast exam.** / Every 3 years for women in their 64s and 2s.  BRCA-related cancer risk assessment.** / For women who have family members with a BRCA-related cancer (breast, ovarian, tubal, or peritoneal cancers).  Pap test.** / Every 2 years from ages 72 through 21. Every 3 years starting at age 67 through age 73 or 54 with a history of 3 consecutive normal Pap tests.  HPV screening.** / Every 3 years from ages 68 through ages 34 to 61 with a history of 3 consecutive normal Pap tests.  Hepatitis C blood test.** / For any individual with known risks for hepatitis C.  Skin self-exam. / Monthly.  Influenza vaccine. / Every year.  Tetanus, diphtheria, and acellular pertussis (Tdap, Td) vaccine.** / Consult your health care provider. Pregnant women should receive 1 dose of Tdap vaccine during each pregnancy. 1 dose of Td every 10 years.  Varicella vaccine.** / Consult your health care provider. Pregnant females who do not have evidence of immunity should receive the first dose after pregnancy.  HPV vaccine. / 3 doses over 6 months, if 28 and younger. The vaccine is not recommended for use in pregnant females. However, pregnancy testing is not needed before receiving a dose.  Measles, mumps, rubella (MMR) vaccine.** / You need at least 1 dose of MMR if you were born in 1957 or later. You may also need a 2nd dose. For females of childbearing age, rubella immunity should be determined. If there  is no evidence of immunity, females who are not pregnant should be vaccinated. If there is no  evidence of immunity, females who are pregnant should delay immunization until after pregnancy.  Pneumococcal 13-valent conjugate (PCV13) vaccine.** / Consult your health care provider.  Pneumococcal polysaccharide (PPSV23) vaccine.** / 1 to 2 doses if you smoke cigarettes or if you have certain conditions.  Meningococcal vaccine.** / 1 dose if you are age 84 to 55 years and a Market researcher living in a residence hall, or have one of several medical conditions, you need to get vaccinated against meningococcal disease. You may also need additional booster doses.  Hepatitis A vaccine.** / Consult your health care provider.  Hepatitis B vaccine.** / Consult your health care provider.  Haemophilus influenzae type b (Hib) vaccine.** / Consult your health care provider. Ages 42 to 55 years  Blood pressure check.** / Every year.  Lipid and cholesterol check.** / Every 5 years beginning at age 28 years.  Lung cancer screening. / Every year if you are aged 15-80 years and have a 30-pack-year history of smoking and currently smoke or have quit within the past 15 years. Yearly screening is stopped once you have quit smoking for at least 15 years or develop a health problem that would prevent you from having lung cancer treatment.  Clinical breast exam.** / Every year after age 24 years.  BRCA-related cancer risk assessment.** / For women who have family members with a BRCA-related cancer (breast, ovarian, tubal, or peritoneal cancers).  Mammogram.** / Every year beginning at age 23 years and continuing for as long as you are in good health. Consult with your health care provider.  Pap test.** / Every 3 years starting at age 84 years through age 92 or 51 years with a history of 3 consecutive normal Pap tests.  HPV screening.** / Every 3 years from ages 55 years through ages 73 to 29 years with a history of 3 consecutive normal Pap tests.  Fecal occult blood test (FOBT) of stool. /  Every year beginning at age 59 years and continuing until age 61 years. You may not need to do this test if you get a colonoscopy every 10 years.  Flexible sigmoidoscopy or colonoscopy.** / Every 5 years for a flexible sigmoidoscopy or every 10 years for a colonoscopy beginning at age 73 years and continuing until age 46 years.  Hepatitis C blood test.** / For all people born from [birth year] through 1967 and any individual with known risks for hepatitis C.  Skin self-exam. / Monthly.  Influenza vaccine. / Every year.  Tetanus, diphtheria, and acellular pertussis (Tdap/Td) vaccine.** / Consult your health care provider. Pregnant women should receive 1 dose of Tdap vaccine during each pregnancy. 1 dose of Td every 10 years.  Varicella vaccine.** / Consult your health care provider. Pregnant females who do not have evidence of immunity should receive the first dose after pregnancy.  Zoster vaccine.** / 1 dose for adults aged 59 years or older.  Measles, mumps, rubella (MMR) vaccine.** / You need at least 1 dose of MMR if you were born in 1957 or later. You may also need a second dose. For females of childbearing age, rubella immunity should be determined. If there is no evidence of immunity, females who are not pregnant should be vaccinated. If there is no evidence of immunity, females who are pregnant should delay immunization until after pregnancy.  Pneumococcal 13-valent conjugate (PCV13) vaccine.** / Consult your health care provider.  Pneumococcal polysaccharide (PPSV23) vaccine.** / 1 to 2 doses if you smoke cigarettes or if you have certain conditions.  Meningococcal vaccine.** / Consult your health care provider.  Hepatitis A vaccine.** / Consult your health care provider.  Hepatitis B vaccine.** / Consult your health care provider.  Haemophilus influenzae type b (Hib) vaccine.** / Consult your health care provider. Ages 80 years and over  Blood pressure check.** / Every year.  Lipid  and cholesterol check.** / Every 5 years beginning at age 63 years.  Lung cancer screening. / Every year if you are aged 25-80 years and have a 30-pack-year history of smoking and currently smoke or have quit within the past 15 years. Yearly screening is stopped once you have quit smoking for at least 15 years or develop a health problem that would prevent you from having lung cancer treatment.  Clinical breast exam.** / Every year after age 71 years.  BRCA-related cancer risk assessment.** / For women who have family members with a BRCA-related cancer (breast, ovarian, tubal, or peritoneal cancers).  Mammogram.** / Every year beginning at age 20 years and continuing for as long as you are in good health. Consult with your health care provider.  Pap test.** / Every 3 years starting at age 10 years through age 83 or 53 years with 3 consecutive normal Pap tests. Testing can be stopped between 65 and 70 years with 3 consecutive normal Pap tests and no abnormal Pap or HPV tests in the past 10 years.  HPV screening.** / Every 3 years from ages 69 years through ages 74 or 95 years with a history of 3 consecutive normal Pap tests. Testing can be stopped between 65 and 70 years with 3 consecutive normal Pap tests and no abnormal Pap or HPV tests in the past 10 years.  Fecal occult blood test (FOBT) of stool. / Every year beginning at age 16 years and continuing until age 43 years. You may not need to do this test if you get a colonoscopy every 10 years.  Flexible sigmoidoscopy or colonoscopy.** / Every 5 years for a flexible sigmoidoscopy or every 10 years for a colonoscopy beginning at age 37 years and continuing until age 74 years.  Hepatitis C blood test.** / For all people born from [birth year] through 1967 and any individual with known risks for hepatitis C.  Osteoporosis screening.** / A one-time screening for women ages 57 years and over and women at risk for fractures or osteoporosis.  Skin  self-exam. / Monthly.  Influenza vaccine. / Every year.  Tetanus, diphtheria, and acellular pertussis (Tdap/Td) vaccine.** / 1 dose of Td every 10 years.  Varicella vaccine.** / Consult your health care provider.  Zoster vaccine.** / 1 dose for adults aged 81 years or older.  Pneumococcal 13-valent conjugate (PCV13) vaccine.** / Consult your health care provider.  Pneumococcal polysaccharide (PPSV23) vaccine.** / 1 dose for all adults aged 67 years and older.  Meningococcal vaccine.** / Consult your health care provider.  Hepatitis A vaccine.** / Consult your health care provider.  Hepatitis B vaccine.** / Consult your health care provider.  Haemophilus influenzae type b (Hib) vaccine.** / Consult your health care provider. ** Family history and personal history of risk and conditions may change your health care provider's recommendations.   This information is not intended to replace advice given to you by your health care provider. Make sure you discuss any questions you have with your health care provider.   Document Released: 11/12/2001 Document Revised:  10/07/2014 Document Reviewed: 02/11/2011 Elsevier Interactive Patient Education [employer].

## 2015-11-11 LAB — HEPATITIS C ANTIBODY: HCV AB: NEGATIVE

## 2015-11-13 ENCOUNTER — Encounter: Payer: Self-pay | Admitting: Family Medicine

## 2015-11-21 ENCOUNTER — Ambulatory Visit (HOSPITAL_BASED_OUTPATIENT_CLINIC_OR_DEPARTMENT_OTHER)
Admission: RE | Admit: 2015-11-21 | Discharge: 2015-11-21 | Disposition: A | Discharge status: Home / Self Care | Payer: Medicare Other | Source: Ambulatory Visit | Attending: Family Medicine | Admitting: Family Medicine

## 2015-11-21 ENCOUNTER — Ambulatory Visit (INDEPENDENT_AMBULATORY_CARE_PROVIDER_SITE_OTHER): Payer: Medicare Other | Admitting: Family Medicine

## 2015-11-21 VITALS — BP 144/90 | HR 118 | Temp 100.8°F | Wt 162.0 lb

## 2015-11-21 DIAGNOSIS — J09X2 Influenza due to identified novel influenza A virus with other respiratory manifestations: Secondary | ICD-10-CM | POA: Diagnosis not present

## 2015-11-21 DIAGNOSIS — J45909 Unspecified asthma, uncomplicated: Secondary | ICD-10-CM | POA: Insufficient documentation

## 2015-11-21 DIAGNOSIS — Z87891 Personal history of nicotine dependence: Secondary | ICD-10-CM | POA: Insufficient documentation

## 2015-11-21 DIAGNOSIS — R509 Fever, unspecified: Secondary | ICD-10-CM | POA: Insufficient documentation

## 2015-11-21 DIAGNOSIS — R05 Cough: Secondary | ICD-10-CM | POA: Diagnosis not present

## 2015-11-21 LAB — POCT INFLUENZA A/B
INFLUENZA B, POC: NEGATIVE
Influenza A, POC: POSITIVE — AB

## 2015-11-21 MED ORDER — PROMETHAZINE-DM 6.25-15 MG/5ML PO SYRP: 5.0000 mL | ORAL_SOLUTION | Freq: Four times a day (QID) | ORAL | Status: DC | PRN

## 2015-11-21 MED ORDER — OSELTAMIVIR PHOSPHATE 75 MG PO CAPS: 75.0000 mg | ORAL_CAPSULE | Freq: Two times a day (BID) | ORAL | Status: DC

## 2015-11-21 MED FILL — PROMETHAZINE-DM SYRUP: 6.25-15 | 6 days supply | Qty: 118 | Fill #0

## 2015-11-21 MED FILL — OSELTAMIVIR PHOS 75 MG CAP: 75 | 5 days supply | Qty: 10 | Fill #0

## 2015-11-21 NOTE — Progress Notes (Signed)
Patient ID: Cynthia May, female    DOB: 1951/08/10  Age: 65 y.o. MRN: [id number]    Subjective:  Subjective HPI Cynthia May presents for fever, congestion, body aches x 3 days.  No other complaints.  No otc meds.   Review of Systems  Constitutional: Positive for fever, chills and fatigue.  HENT: Positive for congestion, postnasal drip, rhinorrhea and sinus pressure.   Respiratory: Positive for cough. Negative for chest tightness, shortness of breath and wheezing.   Cardiovascular: Negative for chest pain, palpitations and leg swelling.  Musculoskeletal: Positive for myalgias. Negative for neck pain.  Allergic/Immunologic: Negative for environmental allergies.    History Past Medical History  Diagnosis Date  . Asthma   . Depression   . Environmental allergies   . HTN (hypertension)   . Thyroid disease   . Pancreatitis     She has past surgical history that includes Cholecystectomy and Tonsillectomy and adenoidectomy.   Her family history includes Arthritis in her mother and sister; Colon cancer in her brother; Depression in her sister; Heart disease in her brother, father, and mother; Lung cancer in her paternal grandmother; Prostate cancer in her brother; Stroke in her mother; Thyroid disease in her mother.She reports that she has never smoked. She has never used smokeless tobacco. She reports that she does not drink alcohol or use illicit drugs.  Current Outpatient Prescriptions on File Prior to Visit  Medication Sig Dispense Refill  . alendronate (FOSAMAX) 70 MG tablet TAKE ONE TABLET BY MOUTH ONCE A WEEK 4 tablet 11  . ANUCORT-HC 25 MG suppository Place 1 suppository rectally as needed.  11  . cycloSPORINE (RESTASIS) 0.05 % ophthalmic emulsion 1 drop 2 (two) times daily.    . DULoxetine (CYMBALTA) 60 MG capsule Take 1 capsule by mouth daily.  5  . levothyroxine (SYNTHROID, LEVOTHROID) 112 MCG tablet Take 1 tablet by mouth daily.  11  . lisinopril  (PRINIVIL,ZESTRIL) 10 MG tablet Take 1 tablet (10 mg total) by mouth daily. 90 tablet 1  . meclizine (ANTIVERT) 25 MG tablet Take 1 tablet (25 mg total) by mouth 3 (three) times daily as needed for dizziness. 30 tablet 0  . traZODone (DESYREL) 50 MG tablet Take 1 tablet by mouth. 3 days a week     No current facility-administered medications on file prior to visit.     Objective:  Objective Physical Exam  Constitutional: She is oriented to person, place, and time. She appears well-developed and well-nourished.  HENT:  Right Ear: External ear normal.  Left Ear: External ear normal.  + PND + errythema  Eyes: Conjunctivae are normal. Right eye exhibits no discharge. Left eye exhibits no discharge.  Cardiovascular: Normal rate, regular rhythm and normal heart sounds.   No murmur heard. Pulmonary/Chest: Effort normal and breath sounds normal. No respiratory distress. She has no wheezes. She has no rales. She exhibits no tenderness.  Musculoskeletal: She exhibits no edema.  Lymphadenopathy:    She has no cervical adenopathy.  Neurological: She is alert and oriented to person, place, and time.  Nursing note and vitals reviewed.  BP 144/90 mmHg  Pulse 118  Temp(Src) 100.8 F (38.2 C) (Oral)  Wt 162 lb (73.483 kg)  SpO2 96% Wt Readings from Last 3 Encounters:  11/21/15 162 lb (73.483 kg)  11/10/15 162 lb (73.483 kg)  07/12/15 160 lb (72.576 kg)     Lab Results  Component Value Date   WBC 8.6 11/10/2015   HGB 15.9* 11/10/2015   HCT 47.4*  11/10/2015   PLT 237 11/10/2015   GLUCOSE 88 11/10/2015   CHOL 212* 11/10/2015   TRIG 119 11/10/2015   HDL 52 11/10/2015   LDLCALC 136* 11/10/2015   ALT 10 11/10/2015   AST 19 11/10/2015   NA 141 11/10/2015   K 4.4 11/10/2015   CL 104 11/10/2015   CREATININE 0.84 11/10/2015   BUN 15 11/10/2015   CO2 25 11/10/2015   TSH 2.04 11/10/2015    Dg Ankle Complete Left  04/25/2015  CLINICAL DATA:  Left ankle pain for 3 weeks. EXAM: LEFT  ANKLE COMPLETE - 3+ VIEW COMPARISON:  None. FINDINGS: There is no evidence of fracture, dislocation, or joint effusion. There is no evidence of arthropathy. A small-moderate calcaneal spur is present. Soft tissues are unremarkable. IMPRESSION: No evidence of acute abnormality. Calcaneal spur. Electronically Signed   By: Margarette Canada M.D.   On: 04/25/2015 12:47     Assessment & Plan:  Plan I am having Ms. Ganci start on oseltamivir and promethazine-dextromethorphan. I am also having her maintain her DULoxetine, ANUCORT-HC, levothyroxine, cycloSPORINE, meclizine, lisinopril, alendronate, and traZODone.  Meds ordered this encounter  Medications  . oseltamivir (TAMIFLU) 75 MG capsule    Sig: Take 1 capsule (75 mg total) by mouth 2 (two) times daily.    Dispense:  10 capsule    Refill:  0  . promethazine-dextromethorphan (PROMETHAZINE-DM) 6.25-15 MG/5ML syrup    Sig: Take 5 mLs by mouth 4 (four) times daily as needed.    Dispense:  118 mL    Refill:  0    Problem List Items Addressed This Visit    None    Visit Diagnoses    Influenza due to identified novel influenza A virus with other respiratory manifestations    -  Primary    Relevant Medications    oseltamivir (TAMIFLU) 75 MG capsule    promethazine-dextromethorphan (PROMETHAZINE-DM) 6.25-15 MG/5ML syrup    Other Relevant Orders    DG Chest 2 View (Completed)    Fever, unspecified        Relevant Orders    POCT Influenza A/B (Completed)    DG Chest 2 View (Completed)       Follow-up: Return if symptoms worsen or fail to improve.  Garnet Koyanagi, DO

## 2015-11-21 NOTE — Patient Instructions (Signed)

## 2015-11-21 NOTE — Progress Notes (Signed)
Pre visit review using our clinic review tool, if applicable. No additional management support is needed unless otherwise documented below in the visit note. 

## 2015-11-22 ENCOUNTER — Encounter: Payer: Self-pay | Admitting: Family Medicine

## 2015-11-25 ENCOUNTER — Other Ambulatory Visit: Payer: Self-pay | Admitting: Family Medicine

## 2016-01-17 ENCOUNTER — Telehealth: Payer: Self-pay

## 2016-01-17 NOTE — Telephone Encounter (Signed)
Results A1c is 5.8 and LDL is 129 Dr.Lowne encourages patient to watch her diet and exercise. Watch her simple sugars and starches and recheck in 3 mos.     KP

## 2016-01-24 ENCOUNTER — Ambulatory Visit (INDEPENDENT_AMBULATORY_CARE_PROVIDER_SITE_OTHER): Payer: Medicare Other | Admitting: *Deleted

## 2016-01-24 DIAGNOSIS — Z23 Encounter for immunization: Secondary | ICD-10-CM | POA: Diagnosis not present

## 2016-01-24 NOTE — Progress Notes (Signed)
Pre visit review using our clinic review tool, if applicable. No additional management support is needed unless otherwise documented below in the visit note.  Pt tolerated injection well. No S/S of a reaction upon leaving the clinic.   Willim Turnage J Raymona Boss, RN  

## 2016-02-05 ENCOUNTER — Telehealth: Payer: Self-pay | Admitting: Family Medicine

## 2016-02-05 DIAGNOSIS — R9089 Other abnormal findings on diagnostic imaging of central nervous system: Secondary | ICD-10-CM

## 2016-02-05 NOTE — Telephone Encounter (Signed)
Spoke with patient and she stated she needs an MRI per her results, The result is in your red folder, Please review and advised. The patient is concerned because it was faxed on 01/26/16 and she had not heard from [place].   KP

## 2016-02-05 NOTE — Telephone Encounter (Signed)
Caller name: Self  Can be reached: [phone]   Reason for call: Request call back in reference to MRI needed

## 2016-02-06 NOTE — Telephone Encounter (Signed)
The MRI order has been placed and the results have been given to [facility] for the Authorization and to send to radiology.      KP

## 2016-02-07 ENCOUNTER — Other Ambulatory Visit: Payer: Self-pay

## 2016-02-07 DIAGNOSIS — Z01818 Encounter for other preprocedural examination: Secondary | ICD-10-CM

## 2016-02-07 NOTE — Telephone Encounter (Signed)
I gave you the report and the BMP order is in.   [place]

## 2016-02-07 NOTE — Telephone Encounter (Signed)
Patient will need lab work prior to MRI, Imaging is needing copy of prior imaging report

## 2016-02-09 ENCOUNTER — Other Ambulatory Visit (INDEPENDENT_AMBULATORY_CARE_PROVIDER_SITE_OTHER): Payer: Medicare Other

## 2016-02-09 DIAGNOSIS — Z01818 Encounter for other preprocedural examination: Secondary | ICD-10-CM | POA: Diagnosis not present

## 2016-02-09 LAB — BASIC METABOLIC PANEL
BUN: 15 mg/dL (ref 7–25)
CALCIUM: 9.6 mg/dL (ref 8.6–10.4)
CHLORIDE: 104 mmol/L (ref 98–110)
CO2: 31 mmol/L (ref 20–31)
Creat: 0.99 mg/dL (ref 0.50–0.99)
GLUCOSE: 104 mg/dL — AB (ref 65–99)
POTASSIUM: 4.4 mmol/L (ref 3.5–5.3)
SODIUM: 141 mmol/L (ref 135–146)

## 2016-02-10 ENCOUNTER — Ambulatory Visit (HOSPITAL_BASED_OUTPATIENT_CLINIC_OR_DEPARTMENT_OTHER)
Admission: RE | Admit: 2016-02-10 | Discharge: 2016-02-10 | Disposition: A | Discharge status: Home / Self Care | Payer: Medicare Other | Source: Ambulatory Visit | Attending: Family Medicine | Admitting: Family Medicine

## 2016-02-10 DIAGNOSIS — R930 Abnormal findings on diagnostic imaging of skull and head, not elsewhere classified: Secondary | ICD-10-CM | POA: Diagnosis present

## 2016-02-10 DIAGNOSIS — G939 Disorder of brain, unspecified: Secondary | ICD-10-CM | POA: Diagnosis not present

## 2016-02-10 DIAGNOSIS — R9089 Other abnormal findings on diagnostic imaging of central nervous system: Secondary | ICD-10-CM

## 2016-02-10 DIAGNOSIS — J329 Chronic sinusitis, unspecified: Secondary | ICD-10-CM | POA: Insufficient documentation

## 2016-02-10 MED ORDER — GADOBENATE DIMEGLUMINE 529 MG/ML IV SOLN
15.0000 mL | Freq: Once | INTRAVENOUS | Status: AC | PRN
  Administered 2016-02-10: 15 mL via INTRAVENOUS

## 2016-02-19 ENCOUNTER — Other Ambulatory Visit: Payer: Self-pay

## 2016-02-19 DIAGNOSIS — I6782 Cerebral ischemia: Secondary | ICD-10-CM

## 2016-02-21 ENCOUNTER — Encounter: Payer: Self-pay | Admitting: *Deleted

## 2016-02-28 ENCOUNTER — Ambulatory Visit (INDEPENDENT_AMBULATORY_CARE_PROVIDER_SITE_OTHER): Payer: Medicare Other | Admitting: Neurology

## 2016-02-28 ENCOUNTER — Encounter: Payer: Self-pay | Admitting: Neurology

## 2016-02-28 VITALS — BP 153/91 | HR 77 | Ht 64.0 in | Wt 158.5 lb

## 2016-02-28 DIAGNOSIS — R930 Abnormal findings on diagnostic imaging of skull and head, not elsewhere classified: Secondary | ICD-10-CM | POA: Diagnosis not present

## 2016-02-28 DIAGNOSIS — R9089 Other abnormal findings on diagnostic imaging of central nervous system: Secondary | ICD-10-CM

## 2016-02-28 NOTE — Progress Notes (Signed)
PATIENT: Cynthia May DOB: December 04, 1950  Chief Complaint  Patient presents with  . Memory Loss    MMSE 30/30 - 21 animals.  She is here with her daughter, Cynthia May.  Reports long term memory loss and word finding difficulty.  She would like to review her recent abnormal MRI.     HISTORICAL  Cynthia May is a 65 year old right-handed female, accompanied by her daughter Cynthia May, seen in refer by  her primary care physician Dr. Cheri Rous for evaluation of abnormal MRI of the brain, concern for memory loss in May 30 first 2017  She had a history of hyperthyroid in the past , was treated with radioactive iodine, eventually become hypothyroidism, she also suffered depression, on thyroid supplement and also Cymbalta 60 mg, trazodone 50 mg every night for her depression.  Her mother and older sister suffered Alzheimer's dementia in their with eighties, she was enrolled in the screening test for [facility] dementia trial, was told that she has abnormal MRI scans, she was referred back to her primary care physician Dr. Cheri Rous, had a repeat MRI with without contrast  We have personally reviewed MRI of the brain with and without contrast in May 2017: Chronic left median temporal lesions, no contrast enhancement, mild supratentorium small vessel disease, no significant atrophy  We also reviewed laboratory evaluation, normal CBC, CMP, with exception of mild elevated glucose 104, A1c 5.8, elevated LDL 129, normal TSH, B12, ESR.  She had Master's degree, is a retired Radio producer at age 23, she lived alone since 2007, she sold her house moved to an apartment in 2014, she enjoys to read, socialize with her friend, swimming, exercise regularly, she drives without getting lost, she used to enjoy knitting, now she has difficulty following the patterns.  She reported a fall injury in 2002, she fell off the ladder about 5 feet high, landed on her occipital region, there was no loss of consciousness, but she  had transient confusion, she could not recall her daughter's number, there was no focal signs, she was not evaluated. There was another passing out episode in 2015, after prolonged tubing in hot summer day, she felt exhausted, fell down landed on the rock, with transient loss of consciousness.  REVIEW OF SYSTEMS: Full 14 system review of systems performed and notable only for Weight loss, fatigue, feeling hot, increased thirst, memory loss, headache, depression, not enough sleep, decreased energy, insomnia, restless legs  ALLERGIES: Allergies  Allergen Reactions  . Augmentin [Amoxicillin-Pot Clavulanate] Other (See Comments)    Flu like symptoms  . Erythromycin Other (See Comments)    Flu like symptoms  . Other     Molds, trees, animal fur, grass.    HOME MEDICATIONS: Current Outpatient Prescriptions  Medication Sig Dispense Refill  . alendronate (FOSAMAX) 70 MG tablet TAKE ONE TABLET BY MOUTH ONCE A WEEK 4 tablet 11  . ANUCORT-HC 25 MG suppository Place 1 suppository rectally as needed.  11  . cycloSPORINE (RESTASIS) 0.05 % ophthalmic emulsion 1 drop 2 (two) times daily.    . DULoxetine (CYMBALTA) 60 MG capsule Take 1 capsule by mouth daily.  5  . levothyroxine (SYNTHROID, LEVOTHROID) 112 MCG tablet Take 1 tablet by mouth daily.  11  . lisinopril (PRINIVIL,ZESTRIL) 10 MG tablet TAKE ONE TABLET BY MOUTH ONCE DAILY 90 tablet 3  . meclizine (ANTIVERT) 25 MG tablet Take 1 tablet (25 mg total) by mouth 3 (three) times daily as needed for dizziness. 30 tablet 0  . traZODone (DESYREL) 50 MG tablet  Take 1 tablet by mouth. 3 days a week     No current facility-administered medications for this visit.    PAST MEDICAL HISTORY: Past Medical History  Diagnosis Date  . Asthma   . Depression   . Environmental allergies   . HTN (hypertension)   . Thyroid disease   . Pancreatitis   . Memory loss     PAST SURGICAL HISTORY: Past Surgical History  Procedure Laterality Date  .  Cholecystectomy    . Tonsillectomy and adenoidectomy      FAMILY HISTORY: Family History  Problem Relation Age of Onset  . Arthritis Mother   . Colon cancer Brother   . Arthritis Sister   . Lung cancer Paternal Grandmother   . Prostate cancer Brother   . Heart disease Mother   . Heart disease Father   . Heart disease Brother   . Stroke Mother   . Depression Sister     5 silbling had depression from Thyroid disease   . Thyroid disease Mother   . Lung cancer Brother   . Other Sister     Creutzfeldt-Jacob Disease  . Heart attack Brother     SOCIAL HISTORY:  Social History   Social History  . Marital Status: Widowed    Spouse Name: N/A  . Number of Children: 3  . Years of Education: Masters   Occupational History  . Retired    Social History Main Topics  . Smoking status: Former Research scientist (life sciences)  . Smokeless tobacco: Never Used     Comment: Quit 1990  . Alcohol Use: No  . Drug Use: No  . Sexual Activity: Not on file   Other Topics Concern  . Not on file   Social History Narrative   Exercise--  Pt is active during day-- no organized exercise.   Lives at home alone.   Right-handed.   1-2 diet sodas per day.     PHYSICAL EXAM   Filed Vitals:   02/28/16 0855  BP: 153/91  Pulse: 77  Height: 5' 4"  (1.626 m)  Weight: 158 lb 8 oz (71.895 kg)    Not recorded      Body mass index is 27.19 kg/(m^2).  PHYSICAL EXAMNIATION:  Gen: NAD, conversant, well nourised, obese, well groomed                     Cardiovascular: Regular rate rhythm, no peripheral edema, warm, nontender. Eyes: Conjunctivae clear without exudates or hemorrhage Neck: Supple, no carotid bruise. Pulmonary: Clear to auscultation bilaterally   NEUROLOGICAL EXAM:  MENTAL STATUS: Speech:    Speech is normal; fluent and spontaneous with normal comprehension.  Cognition:Mini-Mental Status Examination 30/30, animal naming 21     Orientation to time, place and person     Normal recent and remote  memory     Normal Attention span and concentration     Normal Language, naming, repeating,spontaneous speech     Fund of knowledge   CRANIAL NERVES: CN II: Visual fields are full to confrontation. Fundoscopic exam is normal with sharp discs and no vascular changes. Pupils are round equal and briskly reactive to light. CN III, IV, VI: extraocular movement are normal. No ptosis. CN V: Facial sensation is intact to pinprick in all 3 divisions bilaterally. Corneal responses are intact.  CN VII: Face is symmetric with normal eye closure and smile. CN VIII: Hearing is normal to rubbing fingers CN IX, X: Palate elevates symmetrically. Phonation is normal. CN XI: Head turning  and shoulder shrug are intact CN XII: Tongue is midline with normal movements and no atrophy.  MOTOR: There is no pronator drift of out-stretched arms. Muscle bulk and tone are normal. Muscle strength is normal.  REFLEXES: Reflexes are 2+ and symmetric at the biceps, triceps, knees, and ankles. Plantar responses are flexor.  SENSORY: Intact to light touch, pinprick, positional sensation and vibratory sensation are intact in fingers and toes.  COORDINATION: Rapid alternating movements and fine finger movements are intact. There is no dysmetria on finger-to-nose and heel-knee-shin.    GAIT/STANCE: Posture is normal. Gait is steady with normal steps, base, arm swing, and turning. Heel and toe walking are normal. Tandem gait is normal.  Romberg is absent.   DIAGNOSTIC DATA (LABS, IMAGING, TESTING) - I reviewed patient records, labs, notes, testing and imaging myself where available.   ASSESSMENT AND PLAN  Mandolin Falwell is a 65 y.o. female   Abnormal MRI of the brain  Chronic lesion at left median parietal lobe  Differentiation diagnosis including trauma, versus stroke,  Ultrasound of carotid artery  Echocardiogram  Baby aspirin daily  We will repeat MRI in one year   Marcial Pacas, M.D. Ph.D.  [facility] [address], [place], [place] [address] Ph: [phone] Fax: [phone]  CC: [name], DO

## 2016-03-13 ENCOUNTER — Ambulatory Visit (INDEPENDENT_AMBULATORY_CARE_PROVIDER_SITE_OTHER): Payer: Medicare Other

## 2016-03-13 DIAGNOSIS — R930 Abnormal findings on diagnostic imaging of skull and head, not elsewhere classified: Secondary | ICD-10-CM

## 2016-03-13 DIAGNOSIS — R9089 Other abnormal findings on diagnostic imaging of central nervous system: Secondary | ICD-10-CM

## 2016-05-22 ENCOUNTER — Ambulatory Visit (HOSPITAL_BASED_OUTPATIENT_CLINIC_OR_DEPARTMENT_OTHER)
Admission: RE | Admit: 2016-05-22 | Discharge: 2016-05-22 | Disposition: A | Discharge status: Home / Self Care | Payer: Medicare Other | Source: Ambulatory Visit | Attending: Neurology | Admitting: Neurology

## 2016-05-22 ENCOUNTER — Other Ambulatory Visit: Payer: Self-pay | Admitting: Family Medicine

## 2016-05-22 DIAGNOSIS — I351 Nonrheumatic aortic (valve) insufficiency: Secondary | ICD-10-CM | POA: Diagnosis not present

## 2016-05-22 DIAGNOSIS — I10 Essential (primary) hypertension: Secondary | ICD-10-CM | POA: Diagnosis not present

## 2016-05-22 DIAGNOSIS — R930 Abnormal findings on diagnostic imaging of skull and head, not elsewhere classified: Secondary | ICD-10-CM

## 2016-05-22 DIAGNOSIS — I639 Cerebral infarction, unspecified: Secondary | ICD-10-CM | POA: Diagnosis present

## 2016-05-22 DIAGNOSIS — R9089 Other abnormal findings on diagnostic imaging of central nervous system: Secondary | ICD-10-CM

## 2016-05-22 NOTE — Progress Notes (Signed)
Echocardiogram 2D Echocardiogram has been performed.  Cynthia May 05/22/2016, 10:56 AM

## 2016-05-23 ENCOUNTER — Telehealth: Payer: Self-pay | Admitting: Neurology

## 2016-05-23 NOTE — Telephone Encounter (Signed)
Please call patient echocardiogram showed no significant abnormality. 

## 2016-05-23 NOTE — Telephone Encounter (Signed)
Left results on her voicemail (ok per DPR).  Provided our number to call back with any questions.

## 2016-07-04 ENCOUNTER — Ambulatory Visit (INDEPENDENT_AMBULATORY_CARE_PROVIDER_SITE_OTHER): Payer: Medicare Other | Admitting: Behavioral Health

## 2016-07-04 DIAGNOSIS — Z23 Encounter for immunization: Secondary | ICD-10-CM | POA: Diagnosis not present

## 2016-08-01 ENCOUNTER — Encounter: Payer: Self-pay | Admitting: Family Medicine

## 2016-08-01 ENCOUNTER — Ambulatory Visit (INDEPENDENT_AMBULATORY_CARE_PROVIDER_SITE_OTHER): Payer: Medicare Other | Admitting: Family Medicine

## 2016-08-01 VITALS — BP 122/80 | HR 88 | Temp 98.1°F | Resp 16 | Ht 64.0 in | Wt 161.0 lb

## 2016-08-01 DIAGNOSIS — H349 Unspecified retinal vascular occlusion: Secondary | ICD-10-CM

## 2016-08-01 DIAGNOSIS — D229 Melanocytic nevi, unspecified: Secondary | ICD-10-CM | POA: Diagnosis not present

## 2016-08-01 DIAGNOSIS — H348192 Central retinal vein occlusion, unspecified eye, stable: Secondary | ICD-10-CM

## 2016-08-01 DIAGNOSIS — I10 Essential (primary) hypertension: Secondary | ICD-10-CM | POA: Diagnosis not present

## 2016-08-01 LAB — LIPID PANEL
CHOLESTEROL: 203 mg/dL — AB (ref 0–200)
HDL: 56.4 mg/dL (ref >39.00)
LDL Cholesterol: 122 mg/dL — ABNORMAL HIGH (ref 0–99)
NonHDL: 146.69
TRIGLYCERIDES: 124 mg/dL (ref 0.0–149.0)
Total CHOL/HDL Ratio: 4
VLDL: 24.8 mg/dL (ref 0.0–40.0)

## 2016-08-01 LAB — COMPREHENSIVE METABOLIC PANEL
ALT: 10 U/L (ref 0–35)
AST: 16 U/L (ref 0–37)
Albumin: 4.2 g/dL (ref 3.5–5.2)
Alkaline Phosphatase: 63 U/L (ref 39–117)
BUN: 16 mg/dL (ref 6–23)
CHLORIDE: 105 meq/L (ref 96–112)
CO2: 30 mEq/L (ref 19–32)
Calcium: 10 mg/dL (ref 8.4–10.5)
Creatinine, Ser: 0.87 mg/dL (ref 0.40–1.20)
GFR: 69.04 mL/min (ref >60.00)
GLUCOSE: 95 mg/dL (ref 70–99)
POTASSIUM: 4.5 meq/L (ref 3.5–5.1)
SODIUM: 141 meq/L (ref 135–145)
Total Bilirubin: 1.1 mg/dL (ref 0.2–1.2)
Total Protein: 6.8 g/dL (ref 6.0–8.3)

## 2016-08-01 MED ORDER — LISINOPRIL 10 MG PO TABS: 10.0000 mg | ORAL_TABLET | Freq: Every day | ORAL | 3 refills | Status: DC

## 2016-08-01 NOTE — Patient Instructions (Signed)
Moles Moles are usually harmless growths on the skin. They are accumulations of color (pigment) cells in the skin that:   Can be various colors, from light brown to black.  Can appear anywhere on the body.  May remain flat or become raised.  May contain hairs.  May remain smooth or develop wrinkling. Most moles are not cancerous (benign). However, some moles may develop changes and become cancerous. It is important to check your moles every month. If you check your moles regularly, you will be able to notice any changes that may occur.  CAUSES  Moles occur when skin cells grow together in clusters instead of spreading out in the skin as they normally do. The reason for this clustering is unknown. DIAGNOSIS  Your caregiver will perform a skin examination to diagnose your mole.  TREATMENT  Moles usually do not require treatment. If a mole becomes worrisome, your caregiver may choose to take a sample of the mole or remove it entirely, and then send it to a lab for examination.  HOME CARE INSTRUCTIONS  Check your mole(s) monthly for changes that may indicate skin cancer. These changes can include:  A change in size.  A change in color. Note that moles tend to darken during pregnancy or when taking birth control pills (oral contraception).  A change in shape.  A change in the border of the mole.  Wear sunscreen (with an SPF of at least 33) when you spend long periods of time outside. Reapply the sunscreen every 2-3 hours.  Schedule annual appointments with your skin doctor (dermatologist) if you have a large number of moles. SEEK MEDICAL CARE IF:  Your mole changes size, especially if it becomes larger than a pencil eraser.  Your mole changes in color or develops more than one color.  Your mole becomes itchy or bleeds.  Your mole, or the skin near the mole, becomes painful, sore, red, or swollen.  Your mole becomes scaly, sheds skin, or oozes fluid.  Your mole develops  irregular borders.  Your mole becomes flat or develops raised areas.  Your mole becomes hard or soft.   This information is not intended to replace advice given to you by your health care provider. Make sure you discuss any questions you have with your health care provider.   Document Released: 06/11/2001 Document Revised: 06/10/2012 Document Reviewed: 03/30/2012 Elsevier Interactive Patient Education [employer].

## 2016-08-01 NOTE — Progress Notes (Signed)
Pre visit review using our clinic review tool, if applicable. No additional management support is needed unless otherwise documented below in the visit note. 

## 2016-08-01 NOTE — Progress Notes (Signed)
Patient ID: Cynthia May, female    DOB: 07/04/1951  Age: 65 y.o. MRN: [id number]    Subjective:  Subjective  HPI Cynthia May presents for suspicious mole on her back -- its been there forever -- she is not sure if it has changed---  Someone in her water aerobics class sent her in .     Review of Systems  Constitutional: Negative for appetite change, diaphoresis, fatigue and unexpected weight change.  Eyes: Negative for pain, redness and visual disturbance.  Respiratory: Negative for cough, chest tightness, shortness of breath and wheezing.   Cardiovascular: Negative for chest pain, palpitations and leg swelling.  Endocrine: Negative for cold intolerance, heat intolerance, polydipsia, polyphagia and polyuria.  Genitourinary: Negative for difficulty urinating, dysuria and frequency.  Skin:       Suspicious mole on back  Neurological: Negative for dizziness, light-headedness, numbness and headaches.    History Past Medical History:  Diagnosis Date  . Asthma   . Depression   . Environmental allergies   . HTN (hypertension)   . Memory loss   . Pancreatitis   . Thyroid disease     She has a past surgical history that includes Cholecystectomy and Tonsillectomy and adenoidectomy.   Her family history includes Arthritis in her mother and sister; Colon cancer in her brother; Depression in her sister; Heart attack in her brother; Heart disease in her brother, father, and mother; Lung cancer in her brother and paternal grandmother; Other in her sister; Prostate cancer in her brother; Stroke in her mother; Thyroid disease in her mother.She reports that she has quit smoking. She has never used smokeless tobacco. She reports that she does not drink alcohol or use drugs.  Current Outpatient Prescriptions on File Prior to Visit  Medication Sig Dispense Refill  . alendronate (FOSAMAX) 70 MG tablet TAKE ONE TABLET BY MOUTH ONCE A WEEK 12 tablet 3  . ANUCORT-HC 25 MG suppository  Place 1 suppository rectally as needed.  11  . aspirin 81 MG tablet Take 81 mg by mouth daily.    . cycloSPORINE (RESTASIS) 0.05 % ophthalmic emulsion 1 drop 2 (two) times daily.    . DULoxetine (CYMBALTA) 60 MG capsule Take 1 capsule by mouth daily.  5  . levothyroxine (SYNTHROID, LEVOTHROID) 112 MCG tablet Take 1 tablet by mouth daily.  11  . meclizine (ANTIVERT) 25 MG tablet Take 1 tablet (25 mg total) by mouth 3 (three) times daily as needed for dizziness. 30 tablet 0  . traZODone (DESYREL) 50 MG tablet Take 1 tablet by mouth. 3 days a week     No current facility-administered medications on file prior to visit.      Objective:  Objective  Physical Exam  Constitutional: She is oriented to person, place, and time. She appears well-developed and well-nourished.  HENT:  Head: Normocephalic and atraumatic.  Eyes: Conjunctivae and EOM are normal.  Neck: Normal range of motion. Neck supple. No JVD present. Carotid bruit is not present. No thyromegaly present.  Cardiovascular: Normal rate, regular rhythm and normal heart sounds.   No murmur heard. Pulmonary/Chest: Effort normal and breath sounds normal. No respiratory distress. She has no wheezes. She has no rales. She exhibits no tenderness.  Musculoskeletal: She exhibits no edema.  Neurological: She is alert and oriented to person, place, and time.  Skin:     Psychiatric: She has a normal mood and affect. Her behavior is normal. Judgment and thought content normal.  Nursing note and vitals reviewed.  BP  122/80 (BP Location: Left Arm, Patient Position: Sitting, Cuff Size: Large)   Pulse 88   Temp 98.1 F (36.7 C) (Oral)   Resp 16   Ht 5\' 4"  (1.626 m)   Wt 161 lb (73 kg)   SpO2 95%   BMI 27.64 kg/m  Wt Readings from Last 3 Encounters:  08/01/16 161 lb (73 kg)  02/28/16 158 lb 8 oz (71.9 kg)  11/21/15 162 lb (73.5 kg)     Lab Results  Component Value Date   WBC 8.6 11/10/2015   HGB 15.9 (H) 11/10/2015   HCT 47.4 (H)  11/10/2015   PLT 237 11/10/2015   GLUCOSE 95 08/01/2016   CHOL 203 (H) 08/01/2016   TRIG 124.0 08/01/2016   HDL 56.40 08/01/2016   LDLCALC 122 (H) 08/01/2016   ALT 10 08/01/2016   AST 16 08/01/2016   NA 141 08/01/2016   K 4.5 08/01/2016   CL 105 08/01/2016   CREATININE 0.87 08/01/2016   BUN 16 08/01/2016   CO2 30 08/01/2016   TSH 2.04 11/10/2015    No results found.   Assessment & Plan:  Plan  I have changed Ms. Crescenzo's lisinopril. I am also having her maintain her DULoxetine, ANUCORT-HC, levothyroxine, cycloSPORINE, meclizine, traZODone, aspirin, and alendronate.  Meds ordered this encounter  Medications  . lisinopril (PRINIVIL,ZESTRIL) 10 MG tablet    Sig: Take 1 tablet (10 mg total) by mouth daily.    Dispense:  90 tablet    Refill:  3    Problem List Items Addressed This Visit      Unprioritized   Retinal vein occlusion   Relevant Medications   lisinopril (PRINIVIL,ZESTRIL) 10 MG tablet    Other Visit Diagnoses    Suspicious nevus    -  Primary   Relevant Orders   Ambulatory referral to Dermatology   Essential hypertension       Relevant Medications   lisinopril (PRINIVIL,ZESTRIL) 10 MG tablet   Other Relevant Orders   Comprehensive metabolic panel (Completed)   Lipid panel (Completed)      Follow-up: Return in about 6 months (around 01/29/2017) for annual exam, fasting.  [name], DO

## 2016-08-02 ENCOUNTER — Encounter: Payer: Self-pay | Admitting: Family Medicine

## 2016-08-26 ENCOUNTER — Telehealth: Payer: Self-pay | Admitting: *Deleted

## 2016-08-26 NOTE — Telephone Encounter (Signed)
[facility] note received via fax  Call:   Date:08/25/16 Time: 1308   Caller: Self Return number: [phone]  Nurse: Cheri Kearns, RN  Chief Complaint: Dizziness  Reason for call: Caller states having dizziness this AM. HX vertigo. Thinks is same problem.  Related visit to physician within the last 2 weeks: No  Guideline: Dizziness-Vertigo; SEVERE dizziness (vertigo)(e.g., unable to walk without assistance)  Disposition: Go to ED Now (or PCP triage)  **Per chart review, pt did not go to ED. Called pt and left message to return call.**

## 2016-08-27 NOTE — Telephone Encounter (Signed)
Called patient and left message to return call

## 2016-08-27 NOTE — Telephone Encounter (Signed)
Patient returning your call best # [phone] patient will be home until 3pm

## 2016-08-27 NOTE — Telephone Encounter (Signed)
Called pt, she states she is feeling better, not currently experiencing dizziness. States 'I knew it was vertigo. I've had it many times." She took OTC meclizine x2 and symptoms resolved. No further needs identified at this time.

## 2016-08-28 ENCOUNTER — Encounter: Payer: Self-pay | Admitting: Neurology

## 2016-08-28 ENCOUNTER — Ambulatory Visit (INDEPENDENT_AMBULATORY_CARE_PROVIDER_SITE_OTHER): Payer: Medicare Other | Admitting: Neurology

## 2016-08-28 VITALS — BP 157/91 | HR 62 | Ht 64.0 in | Wt 159.5 lb

## 2016-08-28 DIAGNOSIS — R9089 Other abnormal findings on diagnostic imaging of central nervous system: Secondary | ICD-10-CM | POA: Diagnosis not present

## 2016-08-28 DIAGNOSIS — H349 Unspecified retinal vascular occlusion: Secondary | ICD-10-CM | POA: Diagnosis not present

## 2016-08-28 DIAGNOSIS — H348192 Central retinal vein occlusion, unspecified eye, stable: Secondary | ICD-10-CM

## 2016-08-28 NOTE — Progress Notes (Signed)
PATIENT: Cynthia May DOB: 09-20-1951  Chief Complaint  Patient presents with  . Memory Loss    MMSE 30/30 - 13 animals.  She is forgetful and continues to have significant problems with long term memory.     HISTORICAL  Cynthia May is a 65 year old right-handed female, accompanied by her daughter Clementeen Graham, seen in refer by  her primary care physician Dr. Cheri Rous for evaluation of abnormal MRI of the brain, concern for memory loss in May 31st 2017  She had a history of hyperthyroid in the past , was treated with radioactive iodine, eventually become hypothyroidism, she also suffered depression, on thyroid supplement and also Cymbalta 60 mg, trazodone 50 mg every night for her depression.  Her mother and older sister suffered Alzheimer's dementia in their with eighties, she was enrolled in the screening test for [facility] dementia trial, was told that she has abnormal MRI scans, she was referred back to her primary care physician Dr. Cheri Rous, had a repeat MRI with without contrast in May 2017.  We have personally reviewed MRI of the brain with and without contrast in May 2017: Chronic left median temporal lesions, no contrast enhancement, mild supratentorium small vessel disease, no significant atrophy  We also reviewed laboratory evaluation, normal CBC, CMP, with exception of mild elevated glucose 104, A1c 5.8, elevated LDL 129, normal TSH, B12, ESR.  She had Master's degree, is a retired Radio producer at age 66, she lived alone since 2007, she sold her house moved to an apartment in 2014, she enjoys to read, socialize with her friend, swimming, exercise regularly, she drives without getting lost, she used to enjoy knitting, now she has difficulty following the patterns.  She reported a fall injury in 2002, she fell off the ladder about 5 feet high, landed on her occipital region, there was no loss of consciousness, but she had transient confusion, she could not recall her  daughter's number, there was no focal signs, she was not evaluated. There was another passing out episode in 2015, after prolonged tubing in hot summer day, she felt exhausted, fell down landed on the rock, with transient loss of consciousness.  UPDATE Nov 29th 2017: She still has mild memory loss, she still drives without getting lost, she has trouble managing her finance.  She has plantar fasciitis, mild gait abnormality due to feet pain.  She has left retinal vein occlusion, her blood pressure is under good control.  Echocardiogram in August 2017, ultrasound of carotid artery in July 2017 was essentially normal,  I reviewed laboratory evaluations, elevated cholesterol 203, LDL 122, normal CMP, CBC.  REVIEW OF SYSTEMS: Full 14 system review of systems performed and notable only for fatigue, excessive sweating, eye discharge, heat intolerance, blurry vision, restless leg, environmental allergy, frequent wakening, depression, speech difficulty, headaches  ALLERGIES: Allergies  Allergen Reactions  . Augmentin [Amoxicillin-Pot Clavulanate] Other (See Comments)    Flu like symptoms  . Erythromycin Other (See Comments)    Flu like symptoms  . Other     Molds, trees, animal fur, grass.    HOME MEDICATIONS: Current Outpatient Prescriptions  Medication Sig Dispense Refill  . alendronate (FOSAMAX) 70 MG tablet TAKE ONE TABLET BY MOUTH ONCE A WEEK 12 tablet 3  . ANUCORT-HC 25 MG suppository Place 1 suppository rectally as needed.  11  . aspirin 81 MG tablet Take 81 mg by mouth daily.    . cycloSPORINE (RESTASIS) 0.05 % ophthalmic emulsion 1 drop 2 (two) times daily.    Marland Kitchen  DULoxetine (CYMBALTA) 60 MG capsule Take 1 capsule by mouth daily.  5  . levothyroxine (SYNTHROID, LEVOTHROID) 112 MCG tablet Take 1 tablet by mouth daily.  11  . lisinopril (PRINIVIL,ZESTRIL) 10 MG tablet Take 1 tablet (10 mg total) by mouth daily. 90 tablet 3  . meclizine (ANTIVERT) 25 MG tablet Take 1 tablet (25 mg total)  by mouth 3 (three) times daily as needed for dizziness. 30 tablet 0  . traZODone (DESYREL) 50 MG tablet Take 1 at night as needed for sleep     No current facility-administered medications for this visit.     PAST MEDICAL HISTORY: Past Medical History:  Diagnosis Date  . Asthma   . Depression   . Environmental allergies   . HTN (hypertension)   . Memory loss   . Pancreatitis   . Retinal vessel occlusion   . Thyroid disease     PAST SURGICAL HISTORY: Past Surgical History:  Procedure Laterality Date  . CHOLECYSTECTOMY    . TONSILLECTOMY AND ADENOIDECTOMY      FAMILY HISTORY: Family History  Problem Relation Age of Onset  . Arthritis Mother   . Colon cancer Brother   . Arthritis Sister   . Lung cancer Paternal Grandmother   . Prostate cancer Brother   . Heart disease Mother   . Heart disease Father   . Heart disease Brother   . Stroke Mother   . Depression Sister     5 silbling had depression from Thyroid disease   . Thyroid disease Mother   . Lung cancer Brother   . Other Sister     Creutzfeldt-Jacob Disease  . Heart attack Brother     SOCIAL HISTORY:  Social History   Social History  . Marital status: Widowed    Spouse name: N/A  . Number of children: 3  . Years of education: Masters   Occupational History  . Retired    Social History Main Topics  . Smoking status: Former Research scientist (life sciences)  . Smokeless tobacco: Never Used     Comment: Quit 1990  . Alcohol use No  . Drug use: No  . Sexual activity: Not on file   Other Topics Concern  . Not on file   Social History Narrative   Exercise--  Pt is active during day-- no organized exercise.   Lives at home alone.   Right-handed.   1-2 diet sodas per day.     PHYSICAL EXAM   Vitals:   08/28/16 1132  BP: (!) 157/91  Pulse: 62  Weight: 159 lb 8 oz (72.3 kg)  Height: 5' 4" (1.626 m)    Not recorded      Body mass index is 27.38 kg/m.  PHYSICAL EXAMNIATION:  Gen: NAD, conversant, well  nourised, obese, well groomed                     Cardiovascular: Regular rate rhythm, no peripheral edema, warm, nontender. Eyes: Conjunctivae clear without exudates or hemorrhage Neck: Supple, no carotid bruise. Pulmonary: Clear to auscultation bilaterally   NEUROLOGICAL EXAM:  MENTAL STATUS: Speech:    Speech is normal; fluent and spontaneous with normal comprehension.  Cognition:Mini-Mental Status Examination 29/30, animal naming13     Orientation to time, place and person     Recent and remote memory: she missed 1/3 recall     Normal Attention span and concentration     Normal Language, naming, repeating,spontaneous speech     Fund of knowledge   CRANIAL  NERVES: CN II: Visual fields are full to confrontation. Fundoscopic exam is normal with sharp discs and no vascular changes. Pupils are round equal and briskly reactive to light. CN III, IV, VI: extraocular movement are normal. No ptosis. CN V: Facial sensation is intact to pinprick in all 3 divisions bilaterally. Corneal responses are intact.  CN VII: Face is symmetric with normal eye closure and smile. CN VIII: Hearing is normal to rubbing fingers CN IX, X: Palate elevates symmetrically. Phonation is normal. CN XI: Head turning and shoulder shrug are intact CN XII: Tongue is midline with normal movements and no atrophy.  MOTOR: There is no pronator drift of out-stretched arms. Muscle bulk and tone are normal. Muscle strength is normal.  REFLEXES: Reflexes are 2+ and symmetric at the biceps, triceps, knees, and ankles. Plantar responses are flexor.  SENSORY: Intact to light touch, pinprick, positional sensation and vibratory sensation are intact in fingers and toes.  COORDINATION: Rapid alternating movements and fine finger movements are intact. There is no dysmetria on finger-to-nose and heel-knee-shin.    GAIT/STANCE: Posture is normal. Gait is steady with normal steps, base, arm swing, and turning. Heel and toe  walking are normal. Tandem gait is normal.  Romberg is absent.   DIAGNOSTIC DATA (LABS, IMAGING, TESTING) - I reviewed patient records, labs, notes, testing and imaging myself where available.   ASSESSMENT AND PLAN  Gavin Faivre is a 65 y.o. female   Abnormal MRI of the brain  Chronic lesion at left median parietal lobe  Differentiation diagnosis including old trauma, versus stroke,  Ultrasound of carotid artery: was normal in July 2017  Echocardiogram was normal in August 2017  Baby aspirin daily  We will repeat MRI in May 2017, and follow up afterwards.  Mild cognitive impairment  Mini-Mental Status Examination 29/30  No treatable etiology found,   Marcial Pacas, M.D. Ph.D.  [facility] [address] [place], [place] [address] Ph: [phone] Fax: [phone]  CC: [name], DO

## 2016-10-03 ENCOUNTER — Telehealth: Payer: Self-pay | Admitting: Neurology

## 2016-10-03 NOTE — Telephone Encounter (Signed)
Pt called needing MRI, doppler, and any testing results Dr. Krista Blue has ordered faxed to [phone] ATTN: [facility]).

## 2016-10-09 ENCOUNTER — Telehealth: Payer: Self-pay | Admitting: Family Medicine

## 2016-10-09 NOTE — Telephone Encounter (Signed)
01/05/15 [facility], INITIAL VISIT [[id number]] LVM advising patient to call and schedule medicare wellness appointment.

## 2016-10-15 ENCOUNTER — Telehealth: Payer: Self-pay | Admitting: Family Medicine

## 2016-10-15 NOTE — Telephone Encounter (Signed)
Should take a break if its been 5 years or more

## 2016-10-15 NOTE — Telephone Encounter (Signed)
Pt says that she had a home nurse visit w/[employer]. after going over her medication list she was told that she shouldn't take FOSAMAX for a long period of time. Pt says that she's been taking medication for years and was told that she should discuss with provider.   Please advise.   CB: [phone]

## 2016-10-18 NOTE — Telephone Encounter (Signed)
Patient notified and it has been about that long.  She is down for blood work soon and will talk with Dr. Etter Sjogren about when to restart medication.

## 2016-11-18 ENCOUNTER — Other Ambulatory Visit: Payer: Self-pay | Admitting: Family Medicine

## 2016-11-28 LAB — HM MAMMOGRAPHY

## 2016-12-05 ENCOUNTER — Encounter: Payer: Self-pay | Admitting: *Deleted

## 2016-12-11 ENCOUNTER — Other Ambulatory Visit (INDEPENDENT_AMBULATORY_CARE_PROVIDER_SITE_OTHER): Payer: Medicare Other

## 2016-12-11 DIAGNOSIS — I10 Essential (primary) hypertension: Secondary | ICD-10-CM

## 2016-12-11 LAB — COMPREHENSIVE METABOLIC PANEL
ALT: 10 U/L (ref 0–35)
AST: 17 U/L (ref 0–37)
Albumin: 4.2 g/dL (ref 3.5–5.2)
Alkaline Phosphatase: 61 U/L (ref 39–117)
BILIRUBIN TOTAL: 1 mg/dL (ref 0.2–1.2)
BUN: 17 mg/dL (ref 6–23)
CALCIUM: 10.1 mg/dL (ref 8.4–10.5)
CO2: 31 meq/L (ref 19–32)
CREATININE: 0.9 mg/dL (ref 0.40–1.20)
Chloride: 105 mEq/L (ref 96–112)
GFR: 66.32 mL/min (ref >60.00)
GLUCOSE: 68 mg/dL — AB (ref 70–99)
Potassium: 3.6 mEq/L (ref 3.5–5.1)
Sodium: 143 mEq/L (ref 135–145)
Total Protein: 6.8 g/dL (ref 6.0–8.3)

## 2016-12-11 LAB — LIPID PANEL
CHOL/HDL RATIO: 4
Cholesterol: 209 mg/dL — ABNORMAL HIGH (ref 0–200)
HDL: 51.1 mg/dL (ref >39.00)
LDL Cholesterol: 134 mg/dL — ABNORMAL HIGH (ref 0–99)
NONHDL: 157.79
TRIGLYCERIDES: 121 mg/dL (ref 0.0–149.0)
VLDL: 24.2 mg/dL (ref 0.0–40.0)

## 2016-12-16 ENCOUNTER — Other Ambulatory Visit: Payer: Self-pay | Admitting: Family Medicine

## 2016-12-16 DIAGNOSIS — E785 Hyperlipidemia, unspecified: Secondary | ICD-10-CM

## 2016-12-16 MED ORDER — ATORVASTATIN CALCIUM 10 MG PO TABS: 10.0000 mg | ORAL_TABLET | Freq: Every day | ORAL | 3 refills | Status: DC

## 2016-12-19 ENCOUNTER — Ambulatory Visit (INDEPENDENT_AMBULATORY_CARE_PROVIDER_SITE_OTHER): Payer: Medicare Other | Admitting: Family Medicine

## 2016-12-19 ENCOUNTER — Encounter: Payer: Self-pay | Admitting: Family Medicine

## 2016-12-19 VITALS — BP 118/80 | HR 100 | Temp 98.2°F | Resp 16 | Ht 64.0 in | Wt 155.0 lb

## 2016-12-19 DIAGNOSIS — J020 Streptococcal pharyngitis: Secondary | ICD-10-CM

## 2016-12-19 DIAGNOSIS — J029 Acute pharyngitis, unspecified: Secondary | ICD-10-CM | POA: Diagnosis not present

## 2016-12-19 LAB — POCT RAPID STREP A (OFFICE): RAPID STREP A SCREEN: POSITIVE — AB

## 2016-12-19 MED ORDER — PENICILLIN G BENZATHINE & PROC 900000-300000 UNIT/2ML IM SUSP
1.2000 10*6.[IU] | Freq: Once | INTRAMUSCULAR | Status: AC
  Administered 2016-12-19: 1.2 10*6.[IU] via INTRAMUSCULAR

## 2016-12-19 NOTE — Progress Notes (Signed)
Pre visit review using our clinic review tool, if applicable. No additional management support is needed unless otherwise documented below in the visit note. 

## 2016-12-19 NOTE — Progress Notes (Signed)
Subjective:  I acted as a Education administrator for Dr. Royden Purl, LPN    Patient ID: Cynthia May, female    DOB: Dec 04, 1950, 66 y.o.   MRN: [id number]  Chief Complaint  Patient presents with  . Sore Throat    Sore Throat   This is a new problem. The current episode started in the past 7 days. The problem has been gradually worsening. There has been no fever. Pertinent negatives include no congestion, coughing, headaches or vomiting.    Patient is in today for sore throat, fatigue, headache, lost of appetite and earache for about 2 days. Patient report it feels like her head is about to explode.  Patient Care Team: [name], DO as PCP - General (Family Medicine) Lendon Colonel, MD as Referring Physician (Psychiatry) Amalia Greenhouse, MD as Referring Physician (Endocrinology) Lang Snow, MD as Referring Physician (Gynecology) Karoline Caldwell, OD as Referring Physician (Optometry)   Past Medical History:  Diagnosis Date  . Asthma   . Depression   . Environmental allergies   . HTN (hypertension)   . Memory loss   . Pancreatitis   . Retinal vessel occlusion   . Thyroid disease     Past Surgical History:  Procedure Laterality Date  . CHOLECYSTECTOMY    . TONSILLECTOMY AND ADENOIDECTOMY      Family History  Problem Relation Age of Onset  . Arthritis Mother   . Colon cancer Brother   . Arthritis Sister   . Lung cancer Paternal Grandmother   . Prostate cancer Brother   . Heart disease Mother   . Heart disease Father   . Heart disease Brother   . Stroke Mother   . Depression Sister     5 silbling had depression from Thyroid disease   . Thyroid disease Mother   . Lung cancer Brother   . Other Sister     Creutzfeldt-Jacob Disease  . Heart attack Brother     Social History   Social History  . Marital status: Widowed    Spouse name: N/A  . Number of children: 3  . Years of education: Masters   Occupational History  . Retired    Social History Main  Topics  . Smoking status: Former Research scientist (life sciences)  . Smokeless tobacco: Never Used     Comment: Quit 1990  . Alcohol use No  . Drug use: No  . Sexual activity: Not on file   Other Topics Concern  . Not on file   Social History Narrative   Exercise--  Pt is active during day-- no organized exercise.   Lives at home alone.   Right-handed.   1-2 diet sodas per day.    Outpatient Medications Prior to Visit  Medication Sig Dispense Refill  . ANUCORT-HC 25 MG suppository Place 1 suppository rectally as needed.  11  . aspirin 81 MG tablet Take 81 mg by mouth daily.    Marland Kitchen atorvastatin (LIPITOR) 10 MG tablet Take 1 tablet (10 mg total) by mouth daily. 30 tablet 3  . cycloSPORINE (RESTASIS) 0.05 % ophthalmic emulsion 1 drop 2 (two) times daily.    . DULoxetine (CYMBALTA) 60 MG capsule Take 1 capsule by mouth daily.  5  . levothyroxine (SYNTHROID, LEVOTHROID) 112 MCG tablet Take 1 tablet by mouth daily.  11  . lisinopril (PRINIVIL,ZESTRIL) 10 MG tablet Take 1 tablet (10 mg total) by mouth daily. 90 tablet 3  . lisinopril (PRINIVIL,ZESTRIL) 10 MG tablet TAKE ONE TABLET BY MOUTH ONCE  DAILY 90 tablet 3  . meclizine (ANTIVERT) 25 MG tablet Take 1 tablet (25 mg total) by mouth 3 (three) times daily as needed for dizziness. 30 tablet 0  . traZODone (DESYREL) 50 MG tablet Take 1 at night as needed for sleep    . alendronate (FOSAMAX) 70 MG tablet TAKE ONE TABLET BY MOUTH ONCE A WEEK (Patient not taking: Reported on 12/19/2016) 12 tablet 3   No facility-administered medications prior to visit.     Allergies  Allergen Reactions  . Augmentin [Amoxicillin-Pot Clavulanate] Other (See Comments)    Flu like symptoms  . Erythromycin Other (See Comments)    Flu like symptoms  . Other     Molds, trees, animal fur, grass.    Review of Systems  Constitutional: Negative for chills, fever, malaise/fatigue and weight loss.  HENT: Positive for sore throat. Negative for congestion.   Eyes: Negative for blurred  vision.  Respiratory: Negative for cough.   Cardiovascular: Negative for chest pain and palpitations.  Gastrointestinal: Negative for vomiting.  Musculoskeletal: Negative for back pain.  Skin: Negative for rash.  Neurological: Negative for loss of consciousness and headaches.       Objective:    Physical Exam  Constitutional: She is oriented to person, place, and time. She appears well-developed and well-nourished. No distress.  HENT:  Head: Normocephalic and atraumatic.  Right Ear: External ear normal.  Left Ear: External ear normal.  Mouth/Throat: Oropharyngeal exudate and posterior oropharyngeal erythema present. No posterior oropharyngeal edema or tonsillar abscesses.  + PND + errythema  Eyes: Conjunctivae are normal. Pupils are equal, round, and reactive to light. Right eye exhibits no discharge. Left eye exhibits no discharge.  Neck: Normal range of motion. No thyromegaly present.  Cardiovascular: Normal rate, regular rhythm and normal heart sounds.   No murmur heard. Pulmonary/Chest: Effort normal and breath sounds normal. No respiratory distress. She has no wheezes. She has no rales. She exhibits no tenderness.  Abdominal: Soft. Bowel sounds are normal. There is no tenderness.  Musculoskeletal: Normal range of motion. She exhibits no edema or deformity.  Lymphadenopathy:    She has cervical adenopathy.  Neurological: She is alert and oriented to person, place, and time.  Skin: Skin is warm and dry. She is not diaphoretic.  Psychiatric: She has a normal mood and affect.  Nursing note and vitals reviewed.   BP 118/80 (BP Location: Left Arm, Patient Position: Sitting, Cuff Size: Normal)   Pulse 100   Temp 98.2 F (36.8 C) (Oral)   Resp 16   Ht 5\' 4"  (1.626 m)   Wt 155 lb (70.3 kg)   SpO2 97%   BMI 26.61 kg/m  Wt Readings from Last 3 Encounters:  12/19/16 155 lb (70.3 kg)  08/28/16 159 lb 8 oz (72.3 kg)  08/01/16 161 lb (73 kg)   BP Readings from Last 3  Encounters:  12/19/16 118/80  08/28/16 (!) 157/91  08/01/16 122/80     Immunization History  Administered Date(s) Administered  . Influenza, High Dose Seasonal PF 07/04/2016  . Influenza-Unspecified 07/15/2014, 07/01/2015  . Pneumococcal Conjugate-13 01/19/2015  . Pneumococcal Polysaccharide-23 01/24/2016  . Tdap 01/19/2015  . Zoster 07/11/2010    Health Maintenance  Topic Date Due  . MAMMOGRAM  11/29/2018  . COLONOSCOPY  07/16/2021  . TETANUS/TDAP  01/18/2025  . INFLUENZA VACCINE  Completed  . DEXA SCAN  Completed  . Hepatitis C Screening  Completed  . PNA vac Low Risk Adult  Completed  Lab Results  Component Value Date   WBC 8.6 11/10/2015   HGB 15.9 (H) 11/10/2015   HCT 47.4 (H) 11/10/2015   PLT 237 11/10/2015   GLUCOSE 68 (L) 12/11/2016   CHOL 209 (H) 12/11/2016   TRIG 121.0 12/11/2016   HDL 51.10 12/11/2016   LDLCALC 134 (H) 12/11/2016   ALT 10 12/11/2016   AST 17 12/11/2016   NA 143 12/11/2016   K 3.6 12/11/2016   CL 105 12/11/2016   CREATININE 0.90 12/11/2016   BUN 17 12/11/2016   CO2 31 12/11/2016   TSH 2.04 11/10/2015    Lab Results  Component Value Date   TSH 2.04 11/10/2015   Lab Results  Component Value Date   WBC 8.6 11/10/2015   HGB 15.9 (H) 11/10/2015   HCT 47.4 (H) 11/10/2015   MCV 87.5 11/10/2015   PLT 237 11/10/2015   Lab Results  Component Value Date   NA 143 12/11/2016   K 3.6 12/11/2016   CO2 31 12/11/2016   GLUCOSE 68 (L) 12/11/2016   BUN 17 12/11/2016   CREATININE 0.90 12/11/2016   BILITOT 1.0 12/11/2016   ALKPHOS 61 12/11/2016   AST 17 12/11/2016   ALT 10 12/11/2016   PROT 6.8 12/11/2016   ALBUMIN 4.2 12/11/2016   CALCIUM 10.1 12/11/2016   GFR 66.32 12/11/2016   Lab Results  Component Value Date   CHOL 209 (H) 12/11/2016   Lab Results  Component Value Date   HDL 51.10 12/11/2016   Lab Results  Component Value Date   LDLCALC 134 (H) 12/11/2016   Lab Results  Component Value Date   TRIG 121.0 12/11/2016    Lab Results  Component Value Date   CHOLHDL 4 12/11/2016   No results found for: HGBA1C       Assessment & Plan:   Problem List Items Addressed This Visit    None    Visit Diagnoses    Strep throat    -  Primary   Relevant Medications   penicillin g benzathine-penicillin g procaine (BICILLIN-CR) injection 900000-300000 units (Completed)   Sore throat       Relevant Orders   POCT rapid strep A (Completed)    advil/ tylenol for pain Drink plenty of fluids rto prn   I have discontinued Ms. Capes's alendronate. I am also having her maintain her DULoxetine, ANUCORT-HC, levothyroxine, cycloSPORINE, meclizine, aspirin, lisinopril, traZODone, lisinopril, and atorvastatin. We administered penicillin G benzathine-penicillin G procaine.  Meds ordered this encounter  Medications  . penicillin g benzathine-penicillin g procaine (BICILLIN-CR) injection 900000-300000 units    Order Specific Question:   Antibiotic Indication:    Answer:   Pharyngitis    CMA served as scribe during this visit. History, Physical and Plan performed by medical provider. Documentation and orders reviewed and attested to.  [name], DO   Patient ID: Ceira Hoeschen, female   DOB: 08/19/1951, 66 y.o.   MRN: [id number]

## 2016-12-19 NOTE — Patient Instructions (Addendum)

## 2017-01-24 ENCOUNTER — Ambulatory Visit (INDEPENDENT_AMBULATORY_CARE_PROVIDER_SITE_OTHER): Payer: Medicare Other | Admitting: *Deleted

## 2017-01-24 ENCOUNTER — Encounter (INDEPENDENT_AMBULATORY_CARE_PROVIDER_SITE_OTHER): Payer: Self-pay

## 2017-01-24 ENCOUNTER — Encounter: Payer: Self-pay | Admitting: *Deleted

## 2017-01-24 VITALS — BP 122/94 | HR 77 | Ht 63.0 in | Wt 154.4 lb

## 2017-01-24 DIAGNOSIS — Z0000 Encounter for general adult medical examination without abnormal findings: Secondary | ICD-10-CM | POA: Diagnosis not present

## 2017-01-24 DIAGNOSIS — R9089 Other abnormal findings on diagnostic imaging of central nervous system: Secondary | ICD-10-CM | POA: Diagnosis not present

## 2017-01-24 NOTE — Progress Notes (Signed)
Subjective:   Cynthia May is a 66 y.o. female who presents for Medicare Annual (Subsequent) preventive examination.  She follows w/ [facility] for abnormal brain MRI. She is also enrolled in Alzheimer's research study through [facility]. She would like to be referred to a neurologist with [facility].  Review of Systems:  No ROS.  Medicare Wellness Visit.  Cardiac Risk Factors include: advanced age (>24men, >25 women);dyslipidemia;hypertension  Sleep patterns: has difficulty falling asleep, gets up 1 times nightly to void and sleeps 6 hours nightly. Does not nap during the day. Home Safety/Smoke Alarms: Feels safe in home. Smoke alarms in place.  Living environment; residence and Firearm Safety: Lives alone. apartment, no firearms. Seat Belt Safety/Bike Helmet: Wears seat belt.   Counseling:   Eye Exam- Follows w/ Dr. Iona Hansen. Dental- Dr. Delphina Cahill twice yearly. Dr. Docia Barrier twice yearly.  Female:   Pap- Aged out.        Mammo- last 11/28/16. BI-RADS CATEGORY1: Negative.        Dexa scan- last 10/26/15. Osteopenia.         CCS- last 07/17/11. Patient reported, no report on file.     Objective:     Vitals: BP (!) 122/94   Pulse 77   Ht 5\' 3"  (1.6 m)   Wt 154 lb 6.4 oz (70 kg)   SpO2 98%   BMI 27.35 kg/m   Body mass index is 27.35 kg/m.  Wt Readings from Last 3 Encounters:  01/24/17 154 lb 6.4 oz (70 kg)  12/19/16 155 lb (70.3 kg)  08/28/16 159 lb 8 oz (72.3 kg)    Tobacco History  Smoking Status  . Former Smoker  Smokeless Tobacco  . Never Used    Comment: Quit 1990     Counseling given: Not Answered   Past Medical History:  Diagnosis Date  . Asthma   . Depression   . Environmental allergies   . HTN (hypertension)   . Memory loss   . Pancreatitis   . Retinal vessel occlusion   . Thyroid disease    Past Surgical History:  Procedure Laterality Date  . CHOLECYSTECTOMY    . TONSILLECTOMY AND ADENOIDECTOMY     Family History  Problem  Relation Age of Onset  . Arthritis Mother   . Heart disease Mother   . Stroke Mother   . Thyroid disease Mother   . Alzheimer's disease Mother   . Heart disease Father   . Colon cancer Brother   . Depression Sister   . Alzheimer's disease Sister   . Lung cancer Paternal Grandmother   . Prostate cancer Brother   . Heart disease Brother   . Depression Sister     5 silbling had depression from Thyroid disease   . Arthritis Sister   . Lung cancer Brother   . Other Sister     Creutzfeldt-Jacob Disease  . Heart attack Brother    History  Sexual Activity  . Sexual activity: No    Outpatient Encounter Prescriptions as of 01/24/2017  Medication Sig  . aspirin 81 MG tablet Take 81 mg by mouth daily.  Marland Kitchen atorvastatin (LIPITOR) 10 MG tablet Take 1 tablet (10 mg total) by mouth daily.  . cycloSPORINE (RESTASIS) 0.05 % ophthalmic emulsion 1 drop 2 (two) times daily.  . DULoxetine (CYMBALTA) 60 MG capsule Take 1 capsule by mouth daily.  Marland Kitchen levothyroxine (SYNTHROID, LEVOTHROID) 112 MCG tablet Take 1 tablet by mouth daily.  Marland Kitchen lisinopril (PRINIVIL,ZESTRIL) 10 MG tablet Take 1 tablet (  10 mg total) by mouth daily.  Marland Kitchen lisinopril (PRINIVIL,ZESTRIL) 10 MG tablet TAKE ONE TABLET BY MOUTH ONCE DAILY  . traZODone (DESYREL) 50 MG tablet Take 1 nightly for sleep  . [DISCONTINUED] ANUCORT-HC 25 MG suppository Place 1 suppository rectally as needed.  . [DISCONTINUED] meclizine (ANTIVERT) 25 MG tablet Take 1 tablet (25 mg total) by mouth 3 (three) times daily as needed for dizziness. (Patient not taking: Reported on 01/24/2017)   No facility-administered encounter medications on file as of 01/24/2017.     Activities of Daily Living In your present state of health, do you have any difficulty performing the following activities: 01/24/2017  Hearing? N  Vision? N  Difficulty concentrating or making decisions? N  Walking or climbing stairs? N  Dressing or bathing? N  Doing errands, shopping? N  Preparing  Food and eating ? N  Using the Toilet? N  In the past six months, have you accidently leaked urine? N  Do you have problems with loss of bowel control? N  Managing your Medications? N  Managing your Finances? N  Housekeeping or managing your Housekeeping? N  Some recent data might be hidden    Patient Care Team: [name], DO as PCP - General (Family Medicine) Lendon Colonel, MD as Referring Physician (Psychiatry) Amalia Greenhouse, MD as Referring Physician (Endocrinology) Marcial Pacas, MD as Consulting Physician (Neurology) Desma Maxim, MD as Consulting Physician (Ophthalmology)    Assessment:    Physical assessment deferred to PCP.  Exercise Activities and Dietary recommendations Current Exercise Habits: Structured exercise class, Type of exercise: Other - see comments;walking (water aerobics), Intensity: Moderate  Diet (meal preparation, eat out, water intake, caffeinated beverages, dairy products, fruits and vegetables): in general, a "healthy" diet  , well balanced, on average, 2-3 meals per day. Usually eats a light dinner. Drinks water throughout the day. Drinks 1 can of diet soda daily.  Goals    . Weight (lb) < 140 lb (63.5 kg) (pt-stated)      Fall Risk Fall Risk  01/24/2017 11/10/2015 11/10/2015 01/05/2015  Falls in the past year? No No No No   Depression Screen PHQ 2/9 Scores 01/24/2017 11/10/2015 11/10/2015 01/05/2015  PHQ - 2 Score 0 0 0 0     Cognitive Function MMSE - Mini Mental State Exam 01/24/2017 08/28/2016 02/28/2016 11/10/2015  Orientation to time 5 5 5 5   Orientation to Place 5 5 5 5   Registration 3 3 3 3   Attention/ Calculation 5 5 5 5   Recall 3 3 3 3   Language- name 2 objects 2 2 2 2   Language- repeat 1 1 1 1   Language- follow 3 step command 3 3 3 3   Language- read & follow direction 1 1 1 1   Write a sentence 1 1 1 1   Copy design 1 1 1 1   Total score 30 30 30 30         Immunization History  Administered Date(s) Administered  . Influenza,  High Dose Seasonal PF 07/04/2016  . Influenza-Unspecified 07/15/2014, 07/01/2015  . Pneumococcal Conjugate-13 01/19/2015  . Pneumococcal Polysaccharide-23 01/24/2016  . Tdap 01/19/2015  . Zoster 07/11/2010   Screening Tests Health Maintenance  Topic Date Due  . INFLUENZA VACCINE  04/30/2017  . MAMMOGRAM  11/29/2018  . COLONOSCOPY  07/16/2021  . TETANUS/TDAP  01/18/2025  . DEXA SCAN  Completed  . Hepatitis C Screening  Completed  . PNA vac Low Risk Adult  Completed      Plan:  Create and bring a copy of your advance directives to your next office visit.  Continue to eat heart healthy diet (full of fruits, vegetables, whole grains, lean protein, water--limit salt, fat, and sugar intake) and increase physical activity as tolerated.  Schedule a lab appoint mid-June to recheck cholesterol.  Patient to check home records for previous colonoscopy information. She is unable to remember location or provider that did last [facility].  She is interested in [facility] and would like to think about it and discuss w/ PCP at next appointment.  Neurology referral placed for [facility] per Dr. Carollee Herter.  I have personally reviewed and noted the following in the patient's chart:   . Medical and social history . Use of alcohol, tobacco or illicit drugs  . Current medications and supplements . Functional ability and status . Nutritional status . Physical activity . Advanced directives . List of other physicians . Vitals . Screenings to include cognitive, depression, and falls . Referrals and appointments  In addition, I have reviewed and discussed with patient certain preventive protocols, quality metrics, and best practice recommendations. A written personalized care plan for preventive services as well as general preventive health recommendations were provided to patient.     Dorrene German, RN  01/24/2017

## 2017-01-24 NOTE — Progress Notes (Signed)
Pre visit review using our clinic review tool, if applicable. No additional management support is needed unless otherwise documented below in the visit note. 

## 2017-01-24 NOTE — Patient Instructions (Addendum)
Cynthia May , Thank you for taking time to come for your Medicare Wellness Visit. I appreciate your ongoing commitment to your health goals. Please review the following plan we discussed and let me know if I can assist you in the future.   Create and bring a copy of your advance directives to your next office visit.  Continue to eat heart healthy diet (full of fruits, vegetables, whole grains, lean protein, water--limit salt, fat, and sugar intake) and increase physical activity as tolerated.  Check your records at home and see if you can find where you had your last colonoscopy.  Schedule a lab appoint mid-June to recheck your cholesterol.  These are the goals we discussed: Goals    . Weight (lb) < 140 lb (63.5 kg) (pt-stated)       This is a list of the screening recommended for you and due dates:  Health Maintenance  Topic Date Due  . Flu Shot  04/30/2017  . Mammogram  11/29/2018  . Colon Cancer Screening  07/16/2021  . Tetanus Vaccine  01/18/2025  . DEXA scan (bone density measurement)  Completed  .  Hepatitis C: One time screening is recommended by Center for Disease Control  (CDC) for  adults born from [birth year] through 1967.   Completed  . Pneumonia vaccines  Completed    Health Maintenance, Female Adopting a healthy lifestyle and getting preventive care can go a long way to promote health and wellness. Talk with your health care provider about what schedule of regular examinations is right for you. This is a good chance for you to check in with your provider about disease prevention and staying healthy. In between checkups, there are plenty of things you can do on your own. Experts have done a lot of research about which lifestyle changes and preventive measures are most likely to keep you healthy. Ask your health care provider for more information. Weight and diet Eat a healthy diet  Be sure to include plenty of vegetables, fruits, low-fat dairy products, and lean  protein.  Do not eat a lot of foods high in solid fats, added sugars, or salt.  Get regular exercise. This is one of the most important things you can do for your health.  Most adults should exercise for at least 150 minutes each week. The exercise should increase your heart rate and make you sweat (moderate-intensity exercise).  Most adults should also do strengthening exercises at least twice a week. This is in addition to the moderate-intensity exercise. Maintain a healthy weight  Body mass index (BMI) is a measurement that can be used to identify possible weight problems. It estimates body fat based on height and weight. Your health care provider can help determine your BMI and help you achieve or maintain a healthy weight.  For females 43 years of age and older:  A BMI below 18.5 is considered underweight.  A BMI of 18.5 to 24.9 is normal.  A BMI of 25 to 29.9 is considered overweight.  A BMI of 30 and above is considered obese. Watch levels of cholesterol and blood lipids  You should start having your blood tested for lipids and cholesterol at 68 years of age, then have this test every 5 years.  You may need to have your cholesterol levels checked more often if:  Your lipid or cholesterol levels are high.  You are older than 68 years of age.  You are at high risk for heart disease. Cancer screening Lung Cancer  Lung cancer screening is recommended for adults 62-50 years old who are at high risk for lung cancer because of a history of smoking.  A yearly low-dose CT scan of the lungs is recommended for people who:  Currently smoke.  Have quit within the past 15 years.  Have at least a 30-pack-year history of smoking. A pack year is smoking an average of one pack of cigarettes a day for 1 year.  Yearly screening should continue until it has been 15 years since you quit.  Yearly screening should stop if you develop a health problem that would prevent you from having  lung cancer treatment. Breast Cancer  Practice breast self-awareness. This means understanding how your breasts normally appear and feel.  It also means doing regular breast self-exams. Let your health care provider know about any changes, no matter how small.  If you are in your 20s or 30s, you should have a clinical breast exam (CBE) by a health care provider every 1-3 years as part of a regular health exam.  If you are 70 or older, have a CBE every year. Also consider having a breast X-ray (mammogram) every year.  If you have a family history of breast cancer, talk to your health care provider about genetic screening.  If you are at high risk for breast cancer, talk to your health care provider about having an MRI and a mammogram every year.  Breast cancer gene (BRCA) assessment is recommended for women who have family members with BRCA-related cancers. BRCA-related cancers include:  Breast.  Ovarian.  Tubal.  Peritoneal cancers.  Results of the assessment will determine the need for genetic counseling and BRCA1 and BRCA2 testing. Cervical Cancer  Your health care provider may recommend that you be screened regularly for cancer of the pelvic organs (ovaries, uterus, and vagina). This screening involves a pelvic examination, including checking for microscopic changes to the surface of your cervix (Pap test). You may be encouraged to have this screening done every 3 years, beginning at age 7.  For women ages 57-65, health care providers may recommend pelvic exams and Pap testing every 3 years, or they may recommend the Pap and pelvic exam, combined with testing for human papilloma virus (HPV), every 5 years. Some types of HPV increase your risk of cervical cancer. Testing for HPV may also be done on women of any age with unclear Pap test results.  Other health care providers may not recommend any screening for nonpregnant women who are considered low risk for pelvic cancer and who do  not have symptoms. Ask your health care provider if a screening pelvic exam is right for you.  If you have had past treatment for cervical cancer or a condition that could lead to cancer, you need Pap tests and screening for cancer for at least 20 years after your treatment. If Pap tests have been discontinued, your risk factors (such as having a new sexual partner) need to be reassessed to determine if screening should resume. Some women have medical problems that increase the chance of getting cervical cancer. In these cases, your health care provider may recommend more frequent screening and Pap tests. Colorectal Cancer  This type of cancer can be detected and often prevented.  Routine colorectal cancer screening usually begins at 68 years of age and continues through 68 years of age.  Your health care provider may recommend screening at an earlier age if you have risk factors for colon cancer.  Your health care  provider may also recommend using home test kits to check for hidden blood in the stool.  A small camera at the end of a tube can be used to examine your colon directly (sigmoidoscopy or colonoscopy). This is done to check for the earliest forms of colorectal cancer.  Routine screening usually begins at age 71.  Direct examination of the colon should be repeated every 5-10 years through 68 years of age. However, you may need to be screened more often if early forms of precancerous polyps or small growths are found. Skin Cancer  Check your skin from head to toe regularly.  Tell your health care provider about any new moles or changes in moles, especially if there is a change in a mole's shape or color.  Also tell your health care provider if you have a mole that is larger than the size of a pencil eraser.  Always use sunscreen. Apply sunscreen liberally and repeatedly throughout the day.  Protect yourself by wearing long sleeves, pants, a wide-brimmed hat, and sunglasses  whenever you are outside. Heart disease, diabetes, and high blood pressure  High blood pressure causes heart disease and increases the risk of stroke. High blood pressure is more likely to develop in:  People who have blood pressure in the high end of the normal range (130-139/85-89 mm Hg).  People who are overweight or obese.  People who are African American.  If you are 50-73 years of age, have your blood pressure checked every 3-5 years. If you are 20 years of age or older, have your blood pressure checked every year. You should have your blood pressure measured twice-once when you are at a hospital or clinic, and once when you are not at a hospital or clinic. Record the average of the two measurements. To check your blood pressure when you are not at a hospital or clinic, you can use:  An automated blood pressure machine at a pharmacy.  A home blood pressure monitor.  If you are between 67 years and 60 years old, ask your health care provider if you should take aspirin to prevent strokes.  Have regular diabetes screenings. This involves taking a blood sample to check your fasting blood sugar level.  If you are at a normal weight and have a low risk for diabetes, have this test once every three years after 68 years of age.  If you are overweight and have a high risk for diabetes, consider being tested at a younger age or more often. Preventing infection Hepatitis B  If you have a higher risk for hepatitis B, you should be screened for this virus. You are considered at high risk for hepatitis B if:  You were born in a country where hepatitis B is common. Ask your health care provider which countries are considered high risk.  Your parents were born in a high-risk country, and you have not been immunized against hepatitis B (hepatitis B vaccine).  You have HIV or AIDS.  You use needles to inject street drugs.  You live with someone who has hepatitis B.  You have had sex with  someone who has hepatitis B.  You get hemodialysis treatment.  You take certain medicines for conditions, including cancer, organ transplantation, and autoimmune conditions. Hepatitis C  Blood testing is recommended for:  Everyone born from [birth year] through 1967.  Anyone with known risk factors for hepatitis C. Sexually transmitted infections (STIs)  You should be screened for sexually transmitted infections (STIs) including gonorrhea  and chlamydia if:  You are sexually active and are younger than 68 years of age.  You are older than 68 years of age and your health care provider tells you that you are at risk for this type of infection.  Your sexual activity has changed since you were last screened and you are at an increased risk for chlamydia or gonorrhea. Ask your health care provider if you are at risk.  If you do not have HIV, but are at risk, it may be recommended that you take a prescription medicine daily to prevent HIV infection. This is called pre-exposure prophylaxis (PrEP). You are considered at risk if:  You are sexually active and do not regularly use condoms or know the HIV status of your partner(s).  You take drugs by injection.  You are sexually active with a partner who has HIV. Talk with your health care provider about whether you are at high risk of being infected with HIV. If you choose to begin PrEP, you should first be tested for HIV. You should then be tested every 3 months for as long as you are taking PrEP. Pregnancy  If you are premenopausal and you may become pregnant, ask your health care provider about preconception counseling.  If you may become pregnant, take 400 to 800 micrograms (mcg) of folic acid every day.  If you want to prevent pregnancy, talk to your health care provider about birth control (contraception). Osteoporosis and menopause  Osteoporosis is a disease in which the bones lose minerals and strength with aging. This can result in  serious bone fractures. Your risk for osteoporosis can be identified using a bone density scan.  If you are 48 years of age or older, or if you are at risk for osteoporosis and fractures, ask your health care provider if you should be screened.  Ask your health care provider whether you should take a calcium or vitamin D supplement to lower your risk for osteoporosis.  Menopause may have certain physical symptoms and risks.  Hormone replacement therapy may reduce some of these symptoms and risks. Talk to your health care provider about whether hormone replacement therapy is right for you. Follow these instructions at home:  Schedule regular health, dental, and eye exams.  Stay current with your immunizations.  Do not use any tobacco products including cigarettes, chewing tobacco, or electronic cigarettes.  If you are pregnant, do not drink alcohol.  If you are breastfeeding, limit how much and how often you drink alcohol.  Limit alcohol intake to no more than 1 drink per day for nonpregnant women. One drink equals 12 ounces of beer, 5 ounces of wine, or 1 ounces of hard liquor.  Do not use street drugs.  Do not share needles.  Ask your health care provider for help if you need support or information about quitting drugs.  Tell your health care provider if you often feel depressed.  Tell your health care provider if you have ever been abused or do not feel safe at home. This information is not intended to replace advice given to you by your health care provider. Make sure you discuss any questions you have with your health care provider. Document Released: 04/01/2011 Document Revised: 02/22/2016 Document Reviewed: 06/20/2015 Elsevier Interactive Patient Education  2017 [employer].

## 2017-02-20 ENCOUNTER — Telehealth: Payer: Self-pay | Admitting: Family Medicine

## 2017-02-20 NOTE — Telephone Encounter (Signed)
Please give me the ov notes do not put it in the folder-- it takes too long before I see it

## 2017-02-20 NOTE — Telephone Encounter (Signed)
Caller name: Karna Christmas with [facility] Can be reached: [phone] Fax#: [phone]  Reason for call: Terri states pt was seen and Dr. Etter Sjogren will need to order cardiac echo and doppler/us cartoid - she is faxing their OV notes now to our office at fax # [phone] - pt has a BRAO branch retinal artery occlusion left eye - saw Dr. Iona Hansen - after testing please fax results of both tests to Dr. Iona Hansen

## 2017-02-21 ENCOUNTER — Other Ambulatory Visit: Payer: Self-pay | Admitting: Family Medicine

## 2017-02-21 ENCOUNTER — Encounter: Payer: Self-pay | Admitting: Family Medicine

## 2017-02-21 DIAGNOSIS — I10 Essential (primary) hypertension: Secondary | ICD-10-CM

## 2017-02-21 DIAGNOSIS — H349 Unspecified retinal vascular occlusion: Secondary | ICD-10-CM

## 2017-02-21 NOTE — Telephone Encounter (Signed)
Will be on the lookout for them

## 2017-02-25 ENCOUNTER — Other Ambulatory Visit (INDEPENDENT_AMBULATORY_CARE_PROVIDER_SITE_OTHER): Payer: Medicare Other

## 2017-02-25 DIAGNOSIS — E785 Hyperlipidemia, unspecified: Secondary | ICD-10-CM | POA: Diagnosis not present

## 2017-02-25 LAB — LIPID PANEL
CHOLESTEROL: 141 mg/dL (ref 0–200)
HDL: 51.3 mg/dL (ref >39.00)
LDL CALC: 52 mg/dL (ref 0–99)
NonHDL: 89.55
TRIGLYCERIDES: 187 mg/dL — AB (ref 0.0–149.0)
Total CHOL/HDL Ratio: 3
VLDL: 37.4 mg/dL (ref 0.0–40.0)

## 2017-02-25 LAB — COMPREHENSIVE METABOLIC PANEL
ALBUMIN: 4.3 g/dL (ref 3.5–5.2)
ALK PHOS: 68 U/L (ref 39–117)
ALT: 9 U/L (ref 0–35)
AST: 17 U/L (ref 0–37)
BILIRUBIN TOTAL: 1.1 mg/dL (ref 0.2–1.2)
BUN: 13 mg/dL (ref 6–23)
CALCIUM: 10 mg/dL (ref 8.4–10.5)
CO2: 31 mEq/L (ref 19–32)
CREATININE: 1.06 mg/dL (ref 0.40–1.20)
Chloride: 105 mEq/L (ref 96–112)
GFR: 54.87 mL/min — ABNORMAL LOW (ref >60.00)
Glucose, Bld: 93 mg/dL (ref 70–99)
Potassium: 4.3 mEq/L (ref 3.5–5.1)
Sodium: 141 mEq/L (ref 135–145)
TOTAL PROTEIN: 7 g/dL (ref 6.0–8.3)

## 2017-02-26 ENCOUNTER — Other Ambulatory Visit: Payer: Self-pay | Admitting: Family Medicine

## 2017-02-26 DIAGNOSIS — E785 Hyperlipidemia, unspecified: Secondary | ICD-10-CM

## 2017-03-05 ENCOUNTER — Ambulatory Visit (HOSPITAL_BASED_OUTPATIENT_CLINIC_OR_DEPARTMENT_OTHER)
Admission: RE | Admit: 2017-03-05 | Discharge: 2017-03-05 | Disposition: A | Discharge status: Home / Self Care | Payer: Medicare Other | Source: Ambulatory Visit | Attending: Family Medicine | Admitting: Family Medicine

## 2017-03-05 ENCOUNTER — Ambulatory Visit (HOSPITAL_BASED_OUTPATIENT_CLINIC_OR_DEPARTMENT_OTHER): Payer: Medicare Other

## 2017-03-05 DIAGNOSIS — I10 Essential (primary) hypertension: Secondary | ICD-10-CM | POA: Insufficient documentation

## 2017-03-05 DIAGNOSIS — H349 Unspecified retinal vascular occlusion: Secondary | ICD-10-CM | POA: Diagnosis present

## 2017-03-05 NOTE — Progress Notes (Signed)
  Echocardiogram 2D Echocardiogram has been performed.  Cynthia May 03/05/2017, 3:44 PM

## 2017-03-06 ENCOUNTER — Encounter: Payer: Self-pay | Admitting: Family Medicine

## 2017-03-10 ENCOUNTER — Telehealth: Payer: Self-pay | Admitting: *Deleted

## 2017-03-10 NOTE — Telephone Encounter (Signed)
Received Patient's Health Questionnaire from [facility] to be reviewed by PCP and approved [or denied], forwarded to provider/SLS 06/11

## 2017-04-13 ENCOUNTER — Other Ambulatory Visit: Payer: Self-pay | Admitting: Family Medicine

## 2017-04-21 IMAGING — DX DG CHEST 2V
2 series · 2 of 2 positions shown · non-contrast
Comparison: PA and lateral chest x-ray March 17, 2015

CLINICAL DATA: Three days of fever and cough, history of asthma,
community-acquired pneumonia, former smoker.

EXAM:
CHEST  2 VIEW

[chest pa]
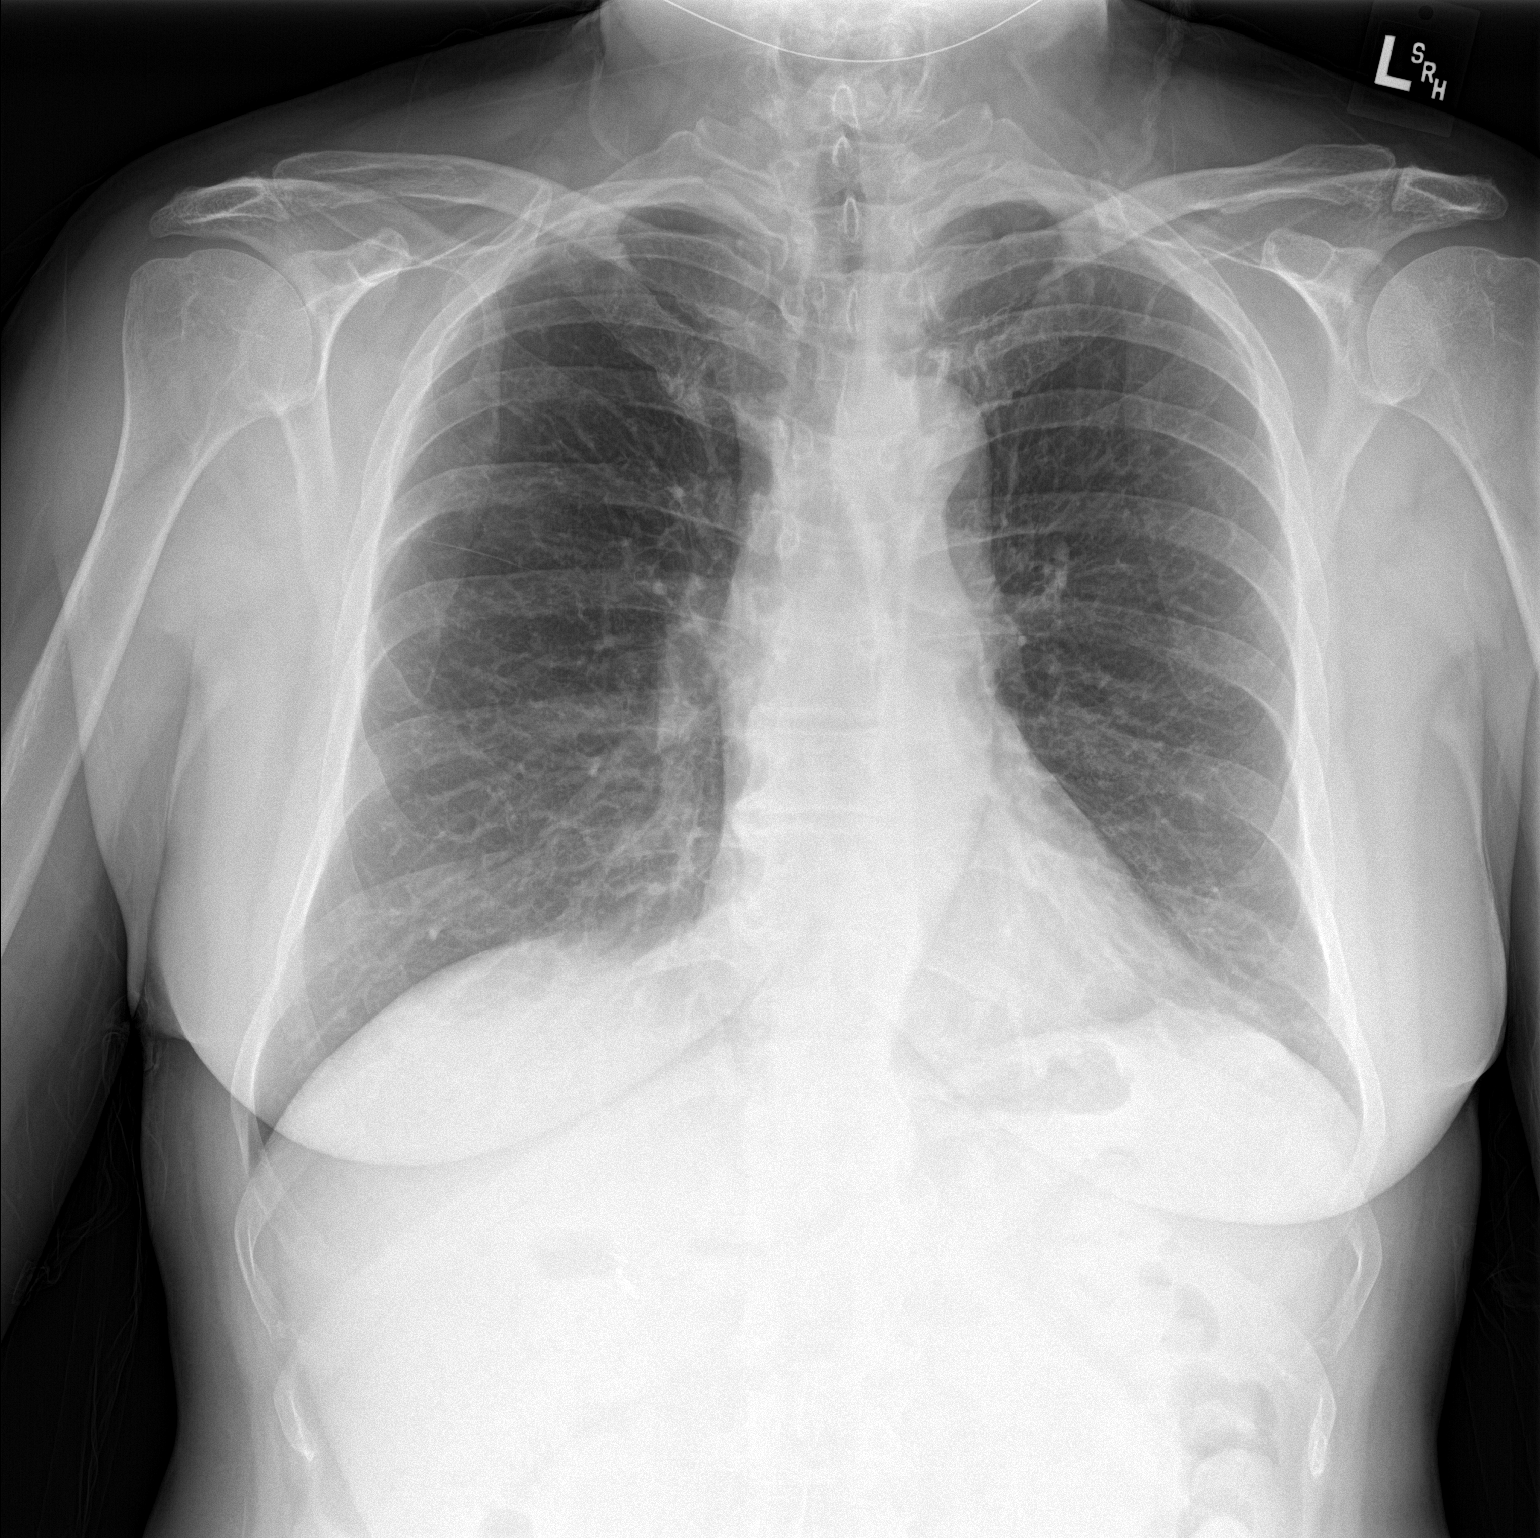

[chest lat]
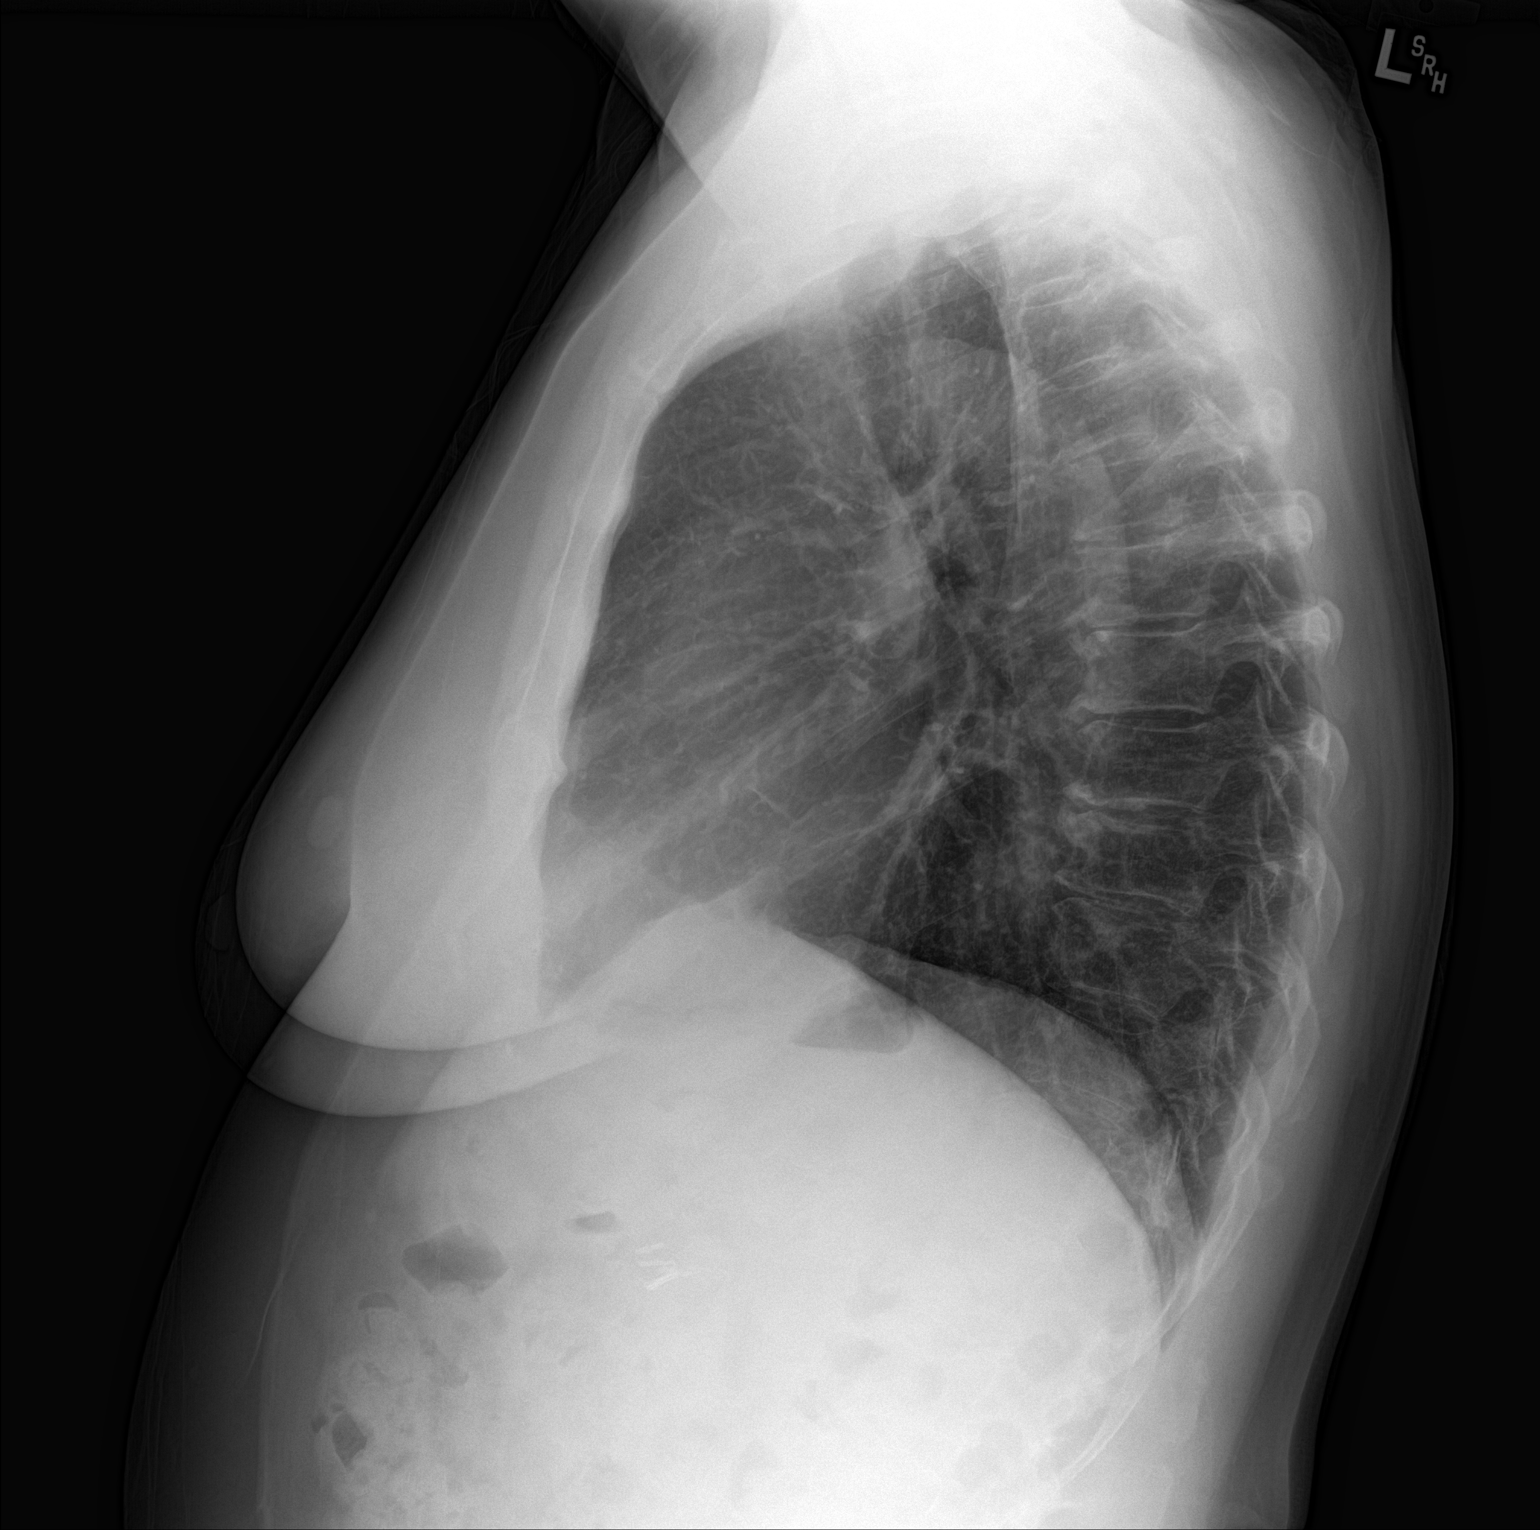

[2 of 2 positions shown; findings below may reference images not displayed]

FINDINGS: The lungs are adequately inflated. There is no focal infiltrate. The
interstitial markings are coarse bilaterally but are stable. The
heart is normal in size. The pulmonary vascularity is not engorged.
The mediastinum is normal in width. The trachea is midline. There is
mild multilevel degenerative disc disease of the thoracic spine.
IMPRESSION: Chronic bronchitic changes. There is no evidence of pneumonia, CHF,
nor other acute cardiopulmonary abnormality.

## 2017-05-30 ENCOUNTER — Other Ambulatory Visit (INDEPENDENT_AMBULATORY_CARE_PROVIDER_SITE_OTHER): Payer: Medicare Other

## 2017-05-30 DIAGNOSIS — E785 Hyperlipidemia, unspecified: Secondary | ICD-10-CM

## 2017-05-30 LAB — COMPREHENSIVE METABOLIC PANEL
ALBUMIN: 4.1 g/dL (ref 3.5–5.2)
ALT: 14 U/L (ref 0–35)
AST: 20 U/L (ref 0–37)
Alkaline Phosphatase: 69 U/L (ref 39–117)
BUN: 14 mg/dL (ref 6–23)
CHLORIDE: 104 meq/L (ref 96–112)
CO2: 32 mEq/L (ref 19–32)
CREATININE: 0.99 mg/dL (ref 0.40–1.20)
Calcium: 10 mg/dL (ref 8.4–10.5)
GFR: 59.33 mL/min — ABNORMAL LOW (ref >60.00)
Glucose, Bld: 81 mg/dL (ref 70–99)
Potassium: 5.1 mEq/L (ref 3.5–5.1)
SODIUM: 140 meq/L (ref 135–145)
TOTAL PROTEIN: 6.5 g/dL (ref 6.0–8.3)
Total Bilirubin: 1.2 mg/dL (ref 0.2–1.2)

## 2017-05-30 LAB — LIPID PANEL
CHOLESTEROL: 135 mg/dL (ref 0–200)
HDL: 49.7 mg/dL (ref >39.00)
LDL Cholesterol: 71 mg/dL (ref 0–99)
NONHDL: 85.73
Total CHOL/HDL Ratio: 3
Triglycerides: 76 mg/dL (ref 0.0–149.0)
VLDL: 15.2 mg/dL (ref 0.0–40.0)

## 2017-06-03 ENCOUNTER — Other Ambulatory Visit: Payer: Self-pay | Admitting: Family Medicine

## 2017-06-03 DIAGNOSIS — E785 Hyperlipidemia, unspecified: Secondary | ICD-10-CM

## 2017-06-13 ENCOUNTER — Telehealth: Payer: Self-pay | Admitting: Family Medicine

## 2017-06-13 NOTE — Telephone Encounter (Signed)
Called pt 06/13/17, lvm for pt to call office to r/s AWV appt from 10:30am to  11 am with [facility] on 01/27/18. SF

## 2017-07-11 IMAGING — MR MR HEAD WO/W CM
8 of 10 series · 34 of 48 positions shown · IV contrast (15ML MULTIHANCE)
Comparison: Report from [facility] which is not dated. Images
not available.

CLINICAL DATA: Abnormal MRI [facility] Can[facility]

EXAM:
MRI HEAD WITHOUT AND WITH CONTRAST
TECHNIQUE: Multiplanar, multiecho pulse sequences of the brain and surrounding
structures were obtained without and with intravenous contrast.
CONTRAST:  15mL MULTIHANCE GADOBENATE DIMEGLUMINE 529 MG/ML IV SOLN

[Series 2: T1 · sagittal · 5.0mm · 0.45mm/px · 3 of 23 slices shown]
[im 1/23]
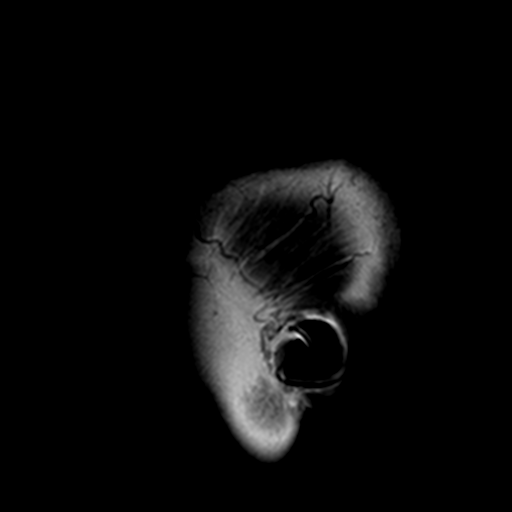
[im 12/23]
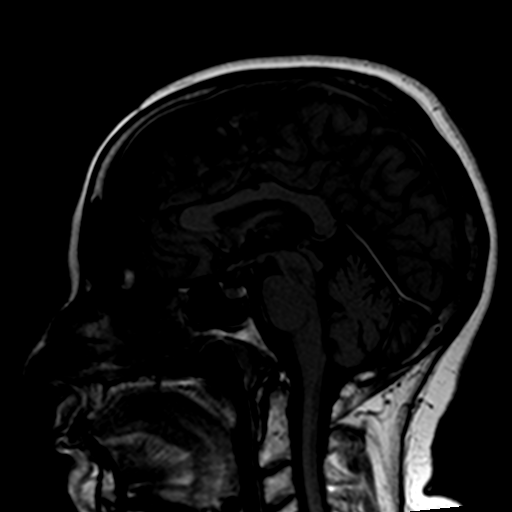
[im 23/23]
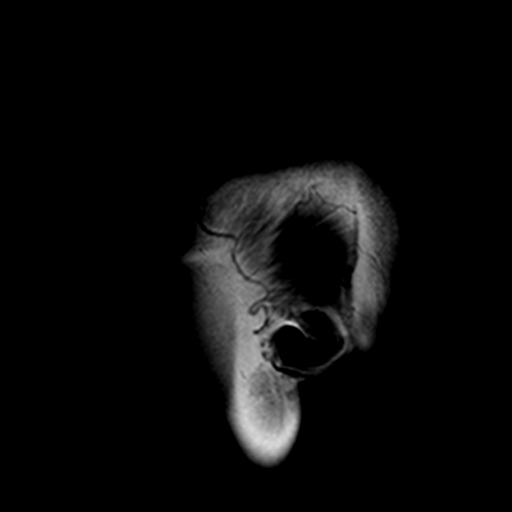

[Series 3: DWI · axial · 3.0mm · 2.19mm/px · z∈[-55,+91]mm · 11 of 90 slices shown (1 of 2)]
[im 1/90]
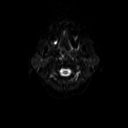
[im 9/90]
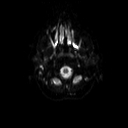
[im 18/90]
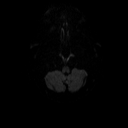
[im 27/90]
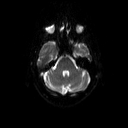
[im 36/90]
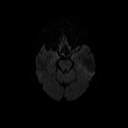
[im 45/90]
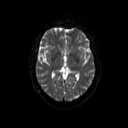
[im 54/90]
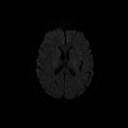
[im 63/90]
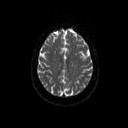
[im 72/90]
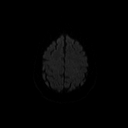
[im 81/90]
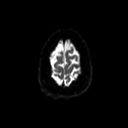
[im 90/90]
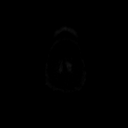

[Series 4: DWI · axial · 3.0mm · 2.19mm/px · z∈[-55,+91]mm · 5 of 45 slices shown (2 of 2)]
[im 1/45]
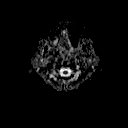
[im 12/45]
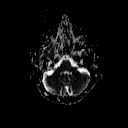
[im 23/45]
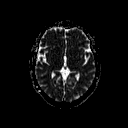
[im 34/45]
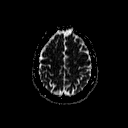
[im 45/45]
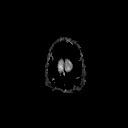

[Series 5: T2 · axial · 5.0mm · 0.45mm/px · z∈[-59,+95]mm · 3 of 23 slices shown (1 of 2)]
[im 1/23]
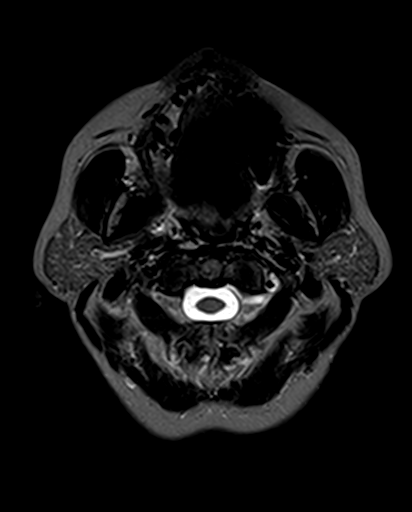
[im 12/23]
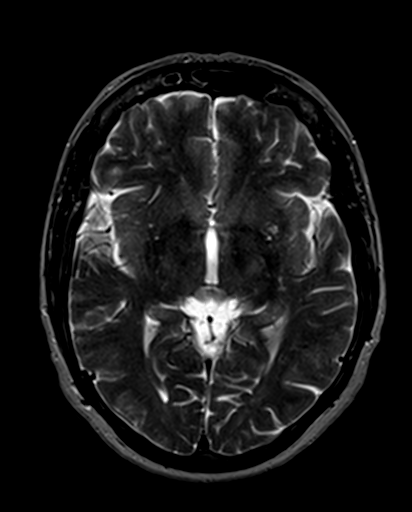
[im 23/23]
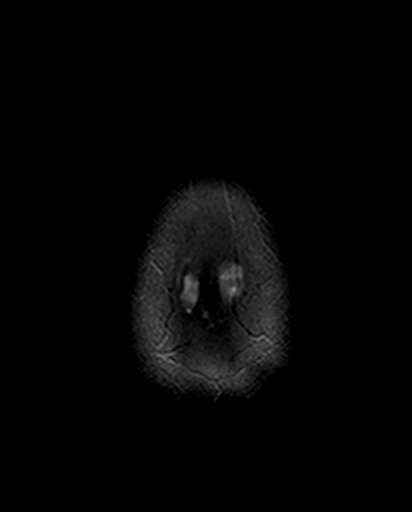

[Series 6: T2 · axial · 5.0mm · 0.45mm/px · z∈[-59,+95]mm · 3 of 23 slices shown (2 of 2)]
[im 1/23]
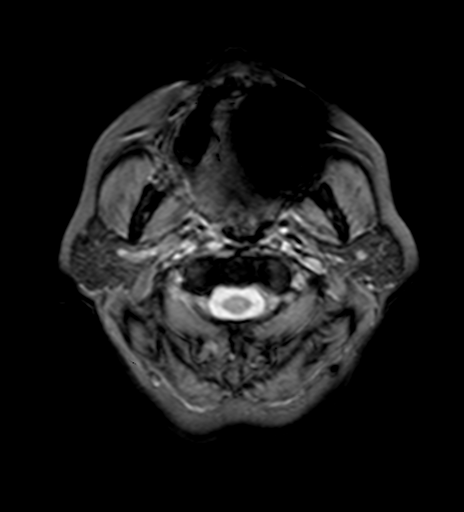
[im 12/23]
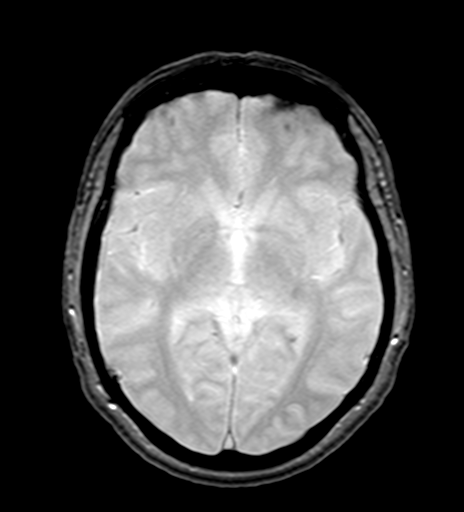
[im 23/23]
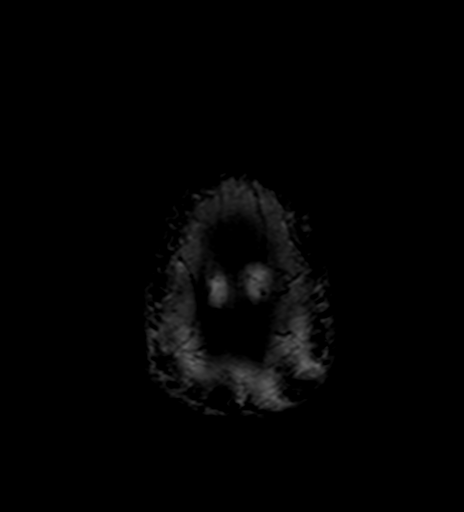

[Series 7: FLAIR · axial · 5.0mm · 0.45mm/px · z∈[-59,+95]mm · 3 of 23 slices shown]
[im 1/23]
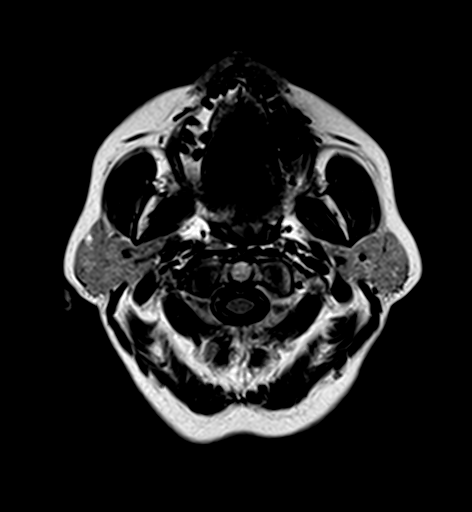
[im 12/23]
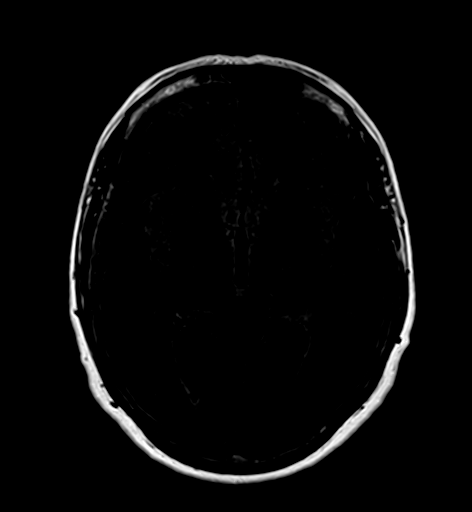
[im 23/23]
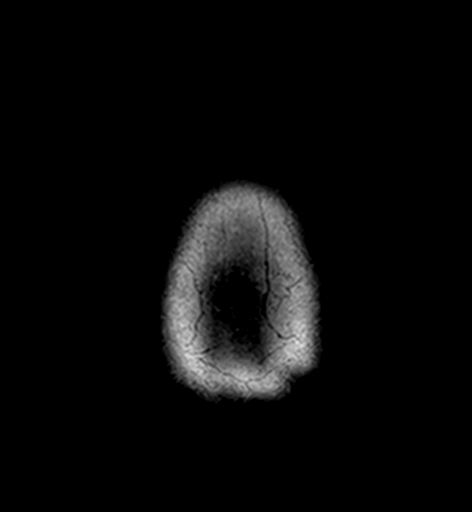

[Series 9: T2 post-contrast · coronal · 5.0mm · 0.45mm/px · 3 of 28 slices shown]
[im 1/28]
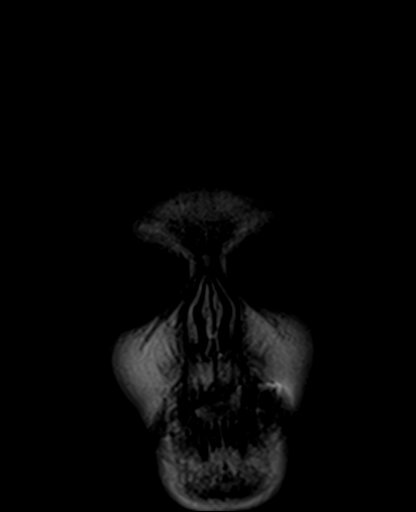
[im 14/28]
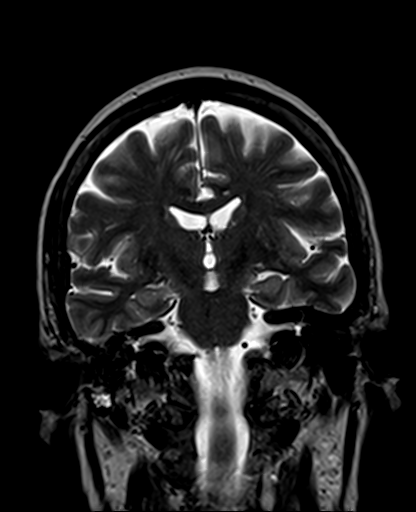
[im 28/28]
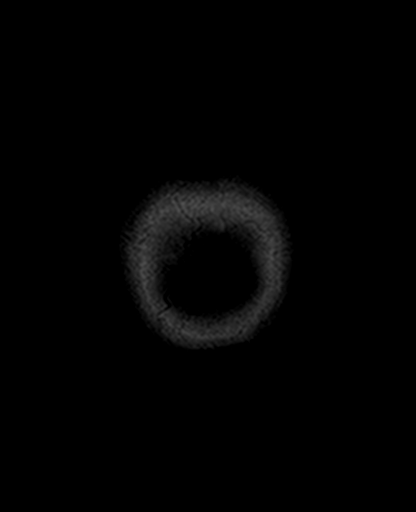

[Series 11: T1 post-contrast · coronal · 5.0mm · 0.45mm/px · 3 of 28 slices shown]
[im 1/28]
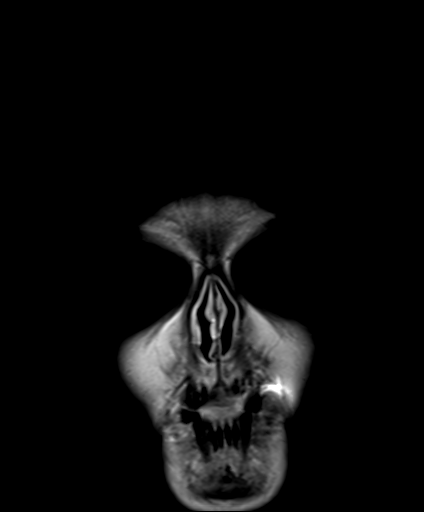
[im 14/28]
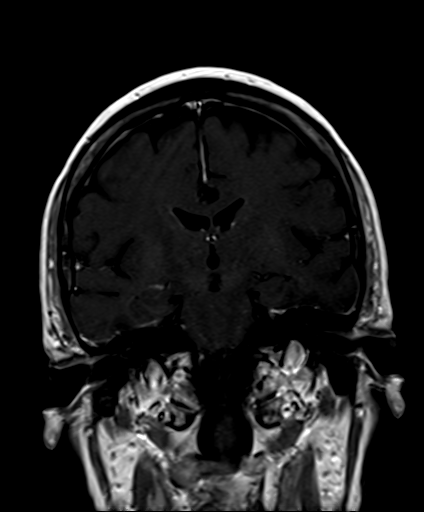
[im 28/28]
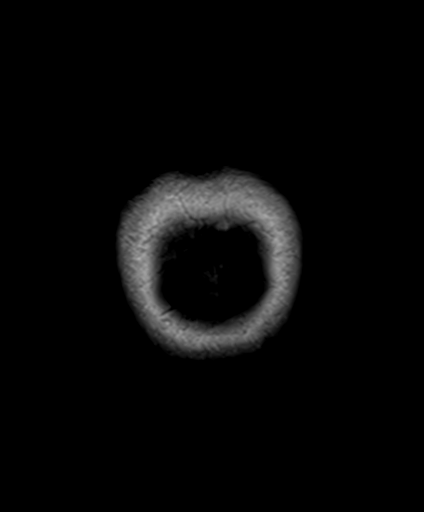

[34 of 48 positions shown; findings below may reference images not displayed]

FINDINGS: Hyperintensity left medial parietal lobe corresponds to the prior
report. This involves cortex and white matter and does not enhance.
No mass effect or edema. On coronal T2 imaging, there appears to be
some volume loss associated with this process. No associated
hemorrhage

Small hyperintensity left parietal subcortical white matter below
the above-mentioned lesion. Remainder of the white matter normal.
Brainstem normal. Basal ganglia normal

Ventricle size normal.  Cerebral volume normal.

Negative for acute infarct.

Negative for hemorrhage or mass.

Postcontrast imaging demonstrates normal enhancement. No enhancing
mass or vascular malformation. Venous enhancement normal.

Pituitary normal in size.  Normal orbit

Moderate mucosal edema in the paranasal sinuses bilaterally. Right
mastoid sinus effusion.
IMPRESSION: Nonenhancing lesion left medial parietal lobe. This involves gray
and white matter. On coronal T2 imaging there appears to be some
cortical volume loss suggestive of a chronic insult such as
ischemia. Mass lesion not considered likely. Consider follow-up MRI
with contrast in 6 months to assure stability.

Chronic sinusitis with right mastoid sinus effusion.

## 2017-08-07 ENCOUNTER — Other Ambulatory Visit: Payer: Self-pay | Admitting: Family Medicine

## 2017-09-19 ENCOUNTER — Other Ambulatory Visit: Payer: Self-pay | Admitting: *Deleted

## 2017-09-19 MED ORDER — ATORVASTATIN CALCIUM 10 MG PO TABS: 10.0000 mg | ORAL_TABLET | Freq: Every day | ORAL | 0 refills | Status: DC

## 2017-10-24 LAB — BASIC METABOLIC PANEL
BUN: 21 (ref 4–21)
CREATININE: 0.7 (ref 0.5–1.1)
Glucose: 119
Potassium: 5 (ref 3.4–5.3)
Sodium: 145 (ref 137–147)

## 2017-10-24 LAB — CBC AND DIFFERENTIAL
HCT: 46 (ref 36–46)
Platelets: 235 (ref 150–399)
WBC: 7.7

## 2017-10-24 LAB — LIPID PANEL
CHOLESTEROL: 161 (ref 0–200)
HDL: 63 (ref 35–70)
LDL Cholesterol: 78
Triglycerides: 102 (ref 40–160)

## 2017-10-24 LAB — VITAMIN B12: VITAMIN B 12: 550

## 2017-10-24 LAB — PROTIME-INR: PROTIME: 10.2 (ref 10.0–13.8)

## 2017-10-24 LAB — TSH: TSH: 1.3 (ref 0.41–5.90)

## 2017-10-24 LAB — HEMOGLOBIN A1C: HEMOGLOBIN A1C: 5.3

## 2017-11-16 ENCOUNTER — Other Ambulatory Visit: Payer: Self-pay | Admitting: Family Medicine

## 2018-01-22 NOTE — Progress Notes (Addendum)
Subjective:   Cynthia May is a 67 y.o. female who presents for Medicare Annual (Subsequent) preventive examination.  Review of Systems: No ROS.  Medicare Wellness Visit. Additional risk factors are reflected in the social history.  Cardiac Risk Factors include: advanced age (>13men, >37 women);hypertension Sleep patterns: Takes Trazodone. Sleeps 8 hrs feels rested.   Home Safety/Smoke Alarms: Feels safe in home. Smoke alarms in place.  Living environment; residence and Firearm Safety: Lives in first floor maintenance.     Female:   Pap- per pt       Mammo- per pt 11/28/17      Dexa scan- ordered       CCS- pt reports last 2013 with [facility].     Objective:     Vitals: BP 120/72 (BP Location: Left Arm, Patient Position: Sitting, Cuff Size: Normal)   Pulse 72   Ht 5\' 3"  (1.6 m)   Wt 136 lb 11.2 oz (62 kg)   SpO2 98%   BMI 24.22 kg/m   Body mass index is 24.22 kg/m.  Advanced Directives 01/27/2018 01/24/2017 11/10/2015 10/15/2014  Does Patient Have a Medical Advance Directive? No No No No  Does patient want to make changes to medical advance directive? - - Yes - information given -  Copy of [place] in Chart? - - No - copy requested -  Would patient like information on creating a medical advance directive? No - Patient declined No - Patient declined Yes - Scientist, clinical (histocompatibility and immunogenetics) given No - patient declined information    Tobacco Social History   Tobacco Use  Smoking Status Former Smoker  Smokeless Tobacco Never Used  Tobacco Comment   Quit 1990     Counseling given: Not Answered Comment: Quit 1990   Clinical Intake: Pain : No/denies pain   Past Medical History:  Diagnosis Date  . Asthma   . Depression   . Environmental allergies   . HTN (hypertension)   . Memory loss   . Pancreatitis   . Retinal vessel occlusion   . Thyroid disease    Past Surgical History:  Procedure Laterality Date  . CHOLECYSTECTOMY    . EYE SURGERY  Bilateral    Cataract sx. Dr.Beavis.  . TONSILLECTOMY AND ADENOIDECTOMY     Family History  Problem Relation Age of Onset  . Arthritis Mother   . Heart disease Mother   . Stroke Mother   . Thyroid disease Mother   . Alzheimer's disease Mother   . Heart disease Father   . Colon cancer Brother   . Depression Sister   . Alzheimer's disease Sister   . Lung cancer Paternal Grandmother   . Prostate cancer Brother   . Heart disease Brother   . Depression Sister        5 silbling had depression from Thyroid disease   . Arthritis Sister   . Lung cancer Brother   . Other Sister        Creutzfeldt-Jacob Disease  . Heart attack Brother    Social History   Socioeconomic History  . Marital status: Widowed    Spouse name: Not on file  . Number of children: 3  . Years of education: Masters  . Highest education level: Not on file  Occupational History  . Occupation: Retired  Scientific laboratory technician  . Financial resource strain: Not on file  . Food insecurity:    Worry: Not on file    Inability: Not on file  . Transportation needs:  Medical: Not on file    Non-medical: Not on file  Tobacco Use  . Smoking status: Former Research scientist (life sciences)  . Smokeless tobacco: Never Used  . Tobacco comment: Quit 1990  Substance and Sexual Activity  . Alcohol use: No    Alcohol/week: 0.0 oz  . Drug use: No  . Sexual activity: Never  Lifestyle  . Physical activity:    Days per week: Not on file    Minutes per session: Not on file  . Stress: Not on file  Relationships  . Social connections:    Talks on phone: Not on file    Gets together: Not on file    Attends religious service: Not on file    Active member of club or organization: Not on file    Attends meetings of clubs or organizations: Not on file    Relationship status: Not on file  Other Topics Concern  . Not on file  Social History Narrative   Exercise--  Pt is active during day-- no organized exercise.   Lives at home alone.   Right-handed.    1-2 diet sodas per day.    Outpatient Encounter Medications as of 01/27/2018  Medication Sig  . atorvastatin (LIPITOR) 10 MG tablet Take 1 tablet (10 mg total) by mouth daily.  . cycloSPORINE (RESTASIS) 0.05 % ophthalmic emulsion 1 drop 2 (two) times daily.  . DULoxetine (CYMBALTA) 60 MG capsule Take 1 capsule by mouth daily.  Marland Kitchen levothyroxine (SYNTHROID, LEVOTHROID) 112 MCG tablet Take 1 tablet by mouth daily.  Marland Kitchen lisinopril (PRINIVIL,ZESTRIL) 10 MG tablet Take 1 tablet (10 mg total) by mouth daily.  . traZODone (DESYREL) 50 MG tablet Take 1 nightly for sleep  . aspirin 81 MG tablet Take 81 mg by mouth daily.  . [DISCONTINUED] lisinopril (PRINIVIL,ZESTRIL) 10 MG tablet TAKE 1 TABLET BY MOUTH ONCE DAILY   No facility-administered encounter medications on file as of 01/27/2018.     Activities of Daily Living In your present state of health, do you have any difficulty performing the following activities: 01/27/2018 01/27/2018  Hearing? N N  Vision? N N  Comment - Cataract sx.  Difficulty concentrating or making decisions? N -  Walking or climbing stairs? N -  Dressing or bathing? N -  Doing errands, shopping? N -  Preparing Food and eating ? N -  Using the Toilet? N -  In the past six months, have you accidently leaked urine? N -  Do you have problems with loss of bowel control? N -  Managing your Medications? N -  Managing your Finances? N -  Housekeeping or managing your Housekeeping? N -  Some recent data might be hidden    Patient Care Team: Carollee Herter, Alferd Apa, DO as PCP - General (Family Medicine) Lendon Colonel, MD as Referring Physician (Psychiatry) Amalia Greenhouse, MD as Referring Physician (Endocrinology) Desma Maxim, MD as Consulting Physician (Ophthalmology) Iona Beard, [place] as Referring Physician (Optometry)    Assessment:   This is a routine wellness examination for Toiya. Physical assessment deferred to PCP.   Exercise Activities and Dietary  recommendations Current Exercise Habits: Home exercise routine, Time (Minutes): 30, Frequency (Times/Week): 3, Weekly Exercise (Minutes/Week): 90, Intensity: Mild, Exercise limited by: None identified Diet (meal preparation, eat out, water intake, caffeinated beverages, dairy products, fruits and vegetables): in general, a "healthy" diet  , well balanced  Goals    . Weight (lb) < 140 lb (63.5 kg) (pt-stated)       Fall  Risk Fall Risk  01/27/2018 01/24/2017 11/10/2015 11/10/2015 01/05/2015  Falls in the past year? No No No No No   Depression Screen PHQ 2/9 Scores 01/27/2018 01/24/2017 11/10/2015 11/10/2015  PHQ - 2 Score 1 0 0 0     Cognitive Function MMSE - Mini Mental State Exam 01/24/2017 08/28/2016 02/28/2016 11/10/2015  Orientation to time 5 5 5 5   Orientation to Place 5 5 5 5   Registration 3 3 3 3   Attention/ Calculation 5 5 5 5   Recall 3 3 3 3   Language- name 2 objects 2 2 2 2   Language- repeat 1 1 1 1   Language- follow 3 step command 3 3 3 3   Language- read & follow direction 1 1 1 1   Write a sentence 1 1 1 1   Copy design 1 1 1 1   Total score 30 30 30 30         Immunization History  Administered Date(s) Administered  . Influenza, High Dose Seasonal PF 07/04/2016  . Influenza-Unspecified 07/15/2014, 07/01/2015, 07/02/2017  . Pneumococcal Conjugate-13 01/19/2015  . Pneumococcal Polysaccharide-23 01/24/2016  . Tdap 01/19/2015  . Zoster 07/11/2010  . Zoster Recombinat (Shingrix) 03/15/2017, 05/31/2017    Screening Tests Health Maintenance  Topic Date Due  . INFLUENZA VACCINE  04/30/2018  . MAMMOGRAM  11/29/2018  . COLONOSCOPY  07/16/2021  . TETANUS/TDAP  01/18/2025  . DEXA SCAN  Completed  . Hepatitis C Screening  Completed  . PNA vac Low Risk Adult  Completed      Plan:   Please schedule appointment with Dr.Lowne for yearly.  Follow up with me in 1 yr  Continue to eat heart healthy diet (full of fruits, vegetables, whole grains, lean protein, water--limit salt,  fat, and sugar intake) and increase physical activity as tolerated.  Continue doing brain stimulating activities (puzzles, reading, adult coloring books, staying active) to keep memory sharp.   Bring a copy of your living will and/or healthcare power of attorney to your next office visit.    I have personally reviewed and noted the following in the patient's chart:   . Medical and social history . Use of alcohol, tobacco or illicit drugs  . Current medications and supplements . Functional ability and status . Nutritional status . Physical activity . Advanced directives . List of other physicians . Hospitalizations, surgeries, and ER visits in previous 12 months . Vitals . Screenings to include cognitive, depression, and falls . Referrals and appointments  In addition, I have reviewed and discussed with patient certain preventive protocols, quality metrics, and best practice recommendations. A written personalized care plan for preventive services as well as general preventive health recommendations were provided to patient.     Shela Nevin, [place]  01/27/2018   Medical screening examination/treatment was performed by qualified clinical staff member and as supervising physician I was immediately available for consultation/collaboration. I have reviewed documentation and agree with assessment and plan.  Penni Homans, MD

## 2018-01-27 ENCOUNTER — Ambulatory Visit (INDEPENDENT_AMBULATORY_CARE_PROVIDER_SITE_OTHER): Payer: Medicare Other | Admitting: *Deleted

## 2018-01-27 ENCOUNTER — Encounter: Payer: Self-pay | Admitting: *Deleted

## 2018-01-27 ENCOUNTER — Ambulatory Visit: Payer: Medicare Other | Admitting: *Deleted

## 2018-01-27 VITALS — BP 120/72 | HR 72 | Ht 63.0 in | Wt 136.7 lb

## 2018-01-27 DIAGNOSIS — Z0000 Encounter for general adult medical examination without abnormal findings: Secondary | ICD-10-CM | POA: Diagnosis not present

## 2018-01-27 DIAGNOSIS — Z780 Asymptomatic menopausal state: Secondary | ICD-10-CM | POA: Diagnosis not present

## 2018-01-27 NOTE — Patient Instructions (Addendum)
Please schedule appointment with Dr.Lowne for yearly.  Follow up with me in 1 yr  Continue to eat heart healthy diet (full of fruits, vegetables, whole grains, lean protein, water--limit salt, fat, and sugar intake) and increase physical activity as tolerated.  Continue doing brain stimulating activities (puzzles, reading, adult coloring books, staying active) to keep memory sharp.   Bring a copy of your living will and/or healthcare power of attorney to your next office visit.  I have ordered your bone density scan. They will call you to schedule appointment.  Cynthia May , Thank you for taking time to come for your Medicare Wellness Visit. I appreciate your ongoing commitment to your health goals. Please review the following plan we discussed and let me know if I can assist you in the future.   These are the goals we discussed: Goals    . Weight (lb) < 140 lb (63.5 kg) (pt-stated)       This is a list of the screening recommended for you and due dates:  Health Maintenance  Topic Date Due  . Flu Shot  04/30/2018  . Mammogram  11/29/2018  . Colon Cancer Screening  07/16/2021  . Tetanus Vaccine  01/18/2025  . DEXA scan (bone density measurement)  Completed  .  Hepatitis C: One time screening is recommended by Center for Disease Control  (CDC) for  adults born from [birth year] through 1967.   Completed  . Pneumonia vaccines  Completed    Health Maintenance for Postmenopausal Women Menopause is a normal process in which your reproductive ability comes to an end. This process happens gradually over a span of months to years, usually between the ages of 74 and 38. Menopause is complete when you have missed 12 consecutive menstrual periods. It is important to talk with your health care provider about some of the most common conditions that affect postmenopausal women, such as heart disease, cancer, and bone loss (osteoporosis). Adopting a healthy lifestyle and getting preventive care can  help to promote your health and wellness. Those actions can also lower your chances of developing some of these common conditions. What should I know about menopause? During menopause, you may experience a number of symptoms, such as:  Moderate-to-severe hot flashes.  Night sweats.  Decrease in sex drive.  Mood swings.  Headaches.  Tiredness.  Irritability.  Memory problems.  Insomnia.  Choosing to treat or not to treat menopausal changes is an individual decision that you make with your health care provider. What should I know about hormone replacement therapy and supplements? Hormone therapy products are effective for treating symptoms that are associated with menopause, such as hot flashes and night sweats. Hormone replacement carries certain risks, especially as you become older. If you are thinking about using estrogen or estrogen with progestin treatments, discuss the benefits and risks with your health care provider. What should I know about heart disease and stroke? Heart disease, heart attack, and stroke become more likely as you age. This may be due, in part, to the hormonal changes that your body experiences during menopause. These can affect how your body processes dietary fats, triglycerides, and cholesterol. Heart attack and stroke are both medical emergencies. There are many things that you can do to help prevent heart disease and stroke:  Have your blood pressure checked at least every 1-2 years. High blood pressure causes heart disease and increases the risk of stroke.  If you are 20-8 years old, ask your health care provider if you  should take aspirin to prevent a heart attack or a stroke.  Do not use any tobacco products, including cigarettes, chewing tobacco, or electronic cigarettes. If you need help quitting, ask your health care provider.  It is important to eat a healthy diet and maintain a healthy weight. ? Be sure to include plenty of vegetables, fruits,  low-fat dairy products, and lean protein. ? Avoid eating foods that are high in solid fats, added sugars, or salt (sodium).  Get regular exercise. This is one of the most important things that you can do for your health. ? Try to exercise for at least 150 minutes each week. The type of exercise that you do should increase your heart rate and make you sweat. This is known as moderate-intensity exercise. ? Try to do strengthening exercises at least twice each week. Do these in addition to the moderate-intensity exercise.  Know your numbers.Ask your health care provider to check your cholesterol and your blood glucose. Continue to have your blood tested as directed by your health care provider.  What should I know about cancer screening? There are several types of cancer. Take the following steps to reduce your risk and to catch any cancer development as early as possible. Breast Cancer  Practice breast self-awareness. ? This means understanding how your breasts normally appear and feel. ? It also means doing regular breast self-exams. Let your health care provider know about any changes, no matter how small.  If you are 53 or older, have a clinician do a breast exam (clinical breast exam or CBE) every year. Depending on your age, family history, and medical history, it may be recommended that you also have a yearly breast X-ray (mammogram).  If you have a family history of breast cancer, talk with your health care provider about genetic screening.  If you are at high risk for breast cancer, talk with your health care provider about having an MRI and a mammogram every year.  Breast cancer (BRCA) gene test is recommended for women who have family members with BRCA-related cancers. Results of the assessment will determine the need for genetic counseling and BRCA1 and for BRCA2 testing. BRCA-related cancers include these types: ? Breast. This occurs in males or females. ? Ovarian. ? Tubal. This  may also be called fallopian tube cancer. ? Cancer of the abdominal or pelvic lining (peritoneal cancer). ? Prostate. ? Pancreatic.  Cervical, Uterine, and Ovarian Cancer Your health care provider may recommend that you be screened regularly for cancer of the pelvic organs. These include your ovaries, uterus, and vagina. This screening involves a pelvic exam, which includes checking for microscopic changes to the surface of your cervix (Pap test).  For women ages 21-65, health care providers may recommend a pelvic exam and a Pap test every three years. For women ages 70-65, they may recommend the Pap test and pelvic exam, combined with testing for human papilloma virus (HPV), every five years. Some types of HPV increase your risk of cervical cancer. Testing for HPV may also be done on women of any age who have unclear Pap test results.  Other health care providers may not recommend any screening for nonpregnant women who are considered low risk for pelvic cancer and have no symptoms. Ask your health care provider if a screening pelvic exam is right for you.  If you have had past treatment for cervical cancer or a condition that could lead to cancer, you need Pap tests and screening for cancer for at  least 20 years after your treatment. If Pap tests have been discontinued for you, your risk factors (such as having a new sexual partner) need to be reassessed to determine if you should start having screenings again. Some women have medical problems that increase the chance of getting cervical cancer. In these cases, your health care provider may recommend that you have screening and Pap tests more often.  If you have a family history of uterine cancer or ovarian cancer, talk with your health care provider about genetic screening.  If you have vaginal bleeding after reaching menopause, tell your health care provider.  There are currently no reliable tests available to screen for ovarian cancer.  Lung  Cancer Lung cancer screening is recommended for adults 28-59 years old who are at high risk for lung cancer because of a history of smoking. A yearly low-dose CT scan of the lungs is recommended if you:  Currently smoke.  Have a history of at least 30 pack-years of smoking and you currently smoke or have quit within the past 15 years. A pack-year is smoking an average of one pack of cigarettes per day for one year.  Yearly screening should:  Continue until it has been 15 years since you quit.  Stop if you develop a health problem that would prevent you from having lung cancer treatment.  Colorectal Cancer  This type of cancer can be detected and can often be prevented.  Routine colorectal cancer screening usually begins at age 60 and continues through age 31.  If you have risk factors for colon cancer, your health care provider may recommend that you be screened at an earlier age.  If you have a family history of colorectal cancer, talk with your health care provider about genetic screening.  Your health care provider may also recommend using home test kits to check for hidden blood in your stool.  A small camera at the end of a tube can be used to examine your colon directly (sigmoidoscopy or colonoscopy). This is done to check for the earliest forms of colorectal cancer.  Direct examination of the colon should be repeated every 5-10 years until age 18. However, if early forms of precancerous polyps or small growths are found or if you have a family history or genetic risk for colorectal cancer, you may need to be screened more often.  Skin Cancer  Check your skin from head to toe regularly.  Monitor any moles. Be sure to tell your health care provider: ? About any new moles or changes in moles, especially if there is a change in a mole's shape or color. ? If you have a mole that is larger than the size of a pencil eraser.  If any of your family members has a history of skin  cancer, especially at a young age, talk with your health care provider about genetic screening.  Always use sunscreen. Apply sunscreen liberally and repeatedly throughout the day.  Whenever you are outside, protect yourself by wearing long sleeves, pants, a wide-brimmed hat, and sunglasses.  What should I know about osteoporosis? Osteoporosis is a condition in which bone destruction happens more quickly than new bone creation. After menopause, you may be at an increased risk for osteoporosis. To help prevent osteoporosis or the bone fractures that can happen because of osteoporosis, the following is recommended:  If you are 43-68 years old, get at least 1,000 mg of calcium and at least 600 mg of vitamin D per day.  If  you are older than age 76 but younger than age 92, get at least 1,200 mg of calcium and at least 600 mg of vitamin D per day.  If you are older than age 65, get at least 1,200 mg of calcium and at least 800 mg of vitamin D per day.  Smoking and excessive alcohol intake increase the risk of osteoporosis. Eat foods that are rich in calcium and vitamin D, and do weight-bearing exercises several times each week as directed by your health care provider. What should I know about how menopause affects my mental health? Depression may occur at any age, but it is more common as you become older. Common symptoms of depression include:  Low or sad mood.  Changes in sleep patterns.  Changes in appetite or eating patterns.  Feeling an overall lack of motivation or enjoyment of activities that you previously enjoyed.  Frequent crying spells.  Talk with your health care provider if you think that you are experiencing depression. What should I know about immunizations? It is important that you get and maintain your immunizations. These include:  Tetanus, diphtheria, and pertussis (Tdap) booster vaccine.  Influenza every year before the flu season begins.  Pneumonia  vaccine.  Shingles vaccine.  Your health care provider may also recommend other immunizations. This information is not intended to replace advice given to you by your health care provider. Make sure you discuss any questions you have with your health care provider. Document Released: 11/08/2005 Document Revised: 04/05/2016 Document Reviewed: 06/20/2015 Elsevier Interactive Patient Education  2018 [employer].

## 2018-02-02 ENCOUNTER — Ambulatory Visit (HOSPITAL_BASED_OUTPATIENT_CLINIC_OR_DEPARTMENT_OTHER)
Admission: RE | Admit: 2018-02-02 | Discharge: 2018-02-02 | Disposition: A | Discharge status: Home / Self Care | Payer: Medicare Other | Source: Ambulatory Visit | Attending: Family Medicine | Admitting: Family Medicine

## 2018-02-02 DIAGNOSIS — Z780 Asymptomatic menopausal state: Secondary | ICD-10-CM | POA: Insufficient documentation

## 2018-02-02 DIAGNOSIS — M8588 Other specified disorders of bone density and structure, other site: Secondary | ICD-10-CM | POA: Diagnosis not present

## 2018-03-12 ENCOUNTER — Encounter: Payer: Self-pay | Admitting: Family Medicine

## 2018-03-12 ENCOUNTER — Ambulatory Visit (INDEPENDENT_AMBULATORY_CARE_PROVIDER_SITE_OTHER): Payer: Medicare Other | Admitting: Family Medicine

## 2018-03-12 VITALS — BP 115/68 | HR 81 | Temp 98.4°F | Resp 16 | Ht 63.0 in | Wt 137.0 lb

## 2018-03-12 DIAGNOSIS — Z0000 Encounter for general adult medical examination without abnormal findings: Secondary | ICD-10-CM | POA: Diagnosis not present

## 2018-03-12 DIAGNOSIS — R739 Hyperglycemia, unspecified: Secondary | ICD-10-CM | POA: Diagnosis not present

## 2018-03-12 DIAGNOSIS — E039 Hypothyroidism, unspecified: Secondary | ICD-10-CM | POA: Diagnosis not present

## 2018-03-12 DIAGNOSIS — I10 Essential (primary) hypertension: Secondary | ICD-10-CM

## 2018-03-12 DIAGNOSIS — E785 Hyperlipidemia, unspecified: Secondary | ICD-10-CM | POA: Diagnosis not present

## 2018-03-12 LAB — CBC WITH DIFFERENTIAL/PLATELET
BASOS PCT: 0.9 % (ref 0.0–3.0)
Basophils Absolute: 0.1 10*3/uL (ref 0.0–0.1)
Eosinophils Absolute: 0.3 10*3/uL (ref 0.0–0.7)
Eosinophils Relative: 4.4 % (ref 0.0–5.0)
HCT: 45.2 % (ref 36.0–46.0)
HEMOGLOBIN: 15.1 g/dL — AB (ref 12.0–15.0)
Lymphocytes Relative: 29.2 % (ref 12.0–46.0)
Lymphs Abs: 1.7 10*3/uL (ref 0.7–4.0)
MCHC: 33.5 g/dL (ref 30.0–36.0)
MCV: 89.2 fl (ref 78.0–100.0)
MONOS PCT: 6.3 % (ref 3.0–12.0)
Monocytes Absolute: 0.4 10*3/uL (ref 0.1–1.0)
Neutro Abs: 3.5 10*3/uL (ref 1.4–7.7)
Neutrophils Relative %: 59.2 % (ref 43.0–77.0)
Platelets: 209 10*3/uL (ref 150.0–400.0)
RBC: 5.06 Mil/uL (ref 3.87–5.11)
RDW: 14.3 % (ref 11.5–15.5)
WBC: 5.9 10*3/uL (ref 4.0–10.5)

## 2018-03-12 LAB — COMPREHENSIVE METABOLIC PANEL
ALBUMIN: 4.2 g/dL (ref 3.5–5.2)
ALT: 13 U/L (ref 0–35)
AST: 19 U/L (ref 0–37)
Alkaline Phosphatase: 59 U/L (ref 39–117)
BUN: 21 mg/dL (ref 6–23)
CALCIUM: 9.7 mg/dL (ref 8.4–10.5)
CHLORIDE: 104 meq/L (ref 96–112)
CO2: 32 mEq/L (ref 19–32)
CREATININE: 0.85 mg/dL (ref 0.40–1.20)
GFR: 70.58 mL/min (ref >60.00)
Glucose, Bld: 73 mg/dL (ref 70–99)
Potassium: 4.6 mEq/L (ref 3.5–5.1)
Sodium: 142 mEq/L (ref 135–145)
Total Bilirubin: 1.1 mg/dL (ref 0.2–1.2)
Total Protein: 6.4 g/dL (ref 6.0–8.3)

## 2018-03-12 LAB — LIPID PANEL
CHOLESTEROL: 145 mg/dL (ref 0–200)
HDL: 59.5 mg/dL (ref >39.00)
LDL CALC: 68 mg/dL (ref 0–99)
NonHDL: 85
TRIGLYCERIDES: 83 mg/dL (ref 0.0–149.0)
Total CHOL/HDL Ratio: 2
VLDL: 16.6 mg/dL (ref 0.0–40.0)

## 2018-03-12 LAB — HEMOGLOBIN A1C: HEMOGLOBIN A1C: 5.6 % (ref 4.6–6.5)

## 2018-03-12 LAB — TSH: TSH: 4.81 u[IU]/mL — ABNORMAL HIGH (ref 0.35–4.50)

## 2018-03-12 NOTE — Patient Instructions (Signed)
Preventive Care 65 Years and Older, Female Preventive care refers to lifestyle choices and visits with your health care provider that can promote health and wellness. What does preventive care include?  A yearly physical exam. This is also called an annual well check.  Dental exams once or twice a year.  Routine eye exams. Ask your health care provider how often you should have your eyes checked.  Personal lifestyle choices, including: ? Daily care of your teeth and gums. ? Regular physical activity. ? Eating a healthy diet. ? Avoiding tobacco and drug use. ? Limiting alcohol use. ? Practicing safe sex. ? Taking low-dose aspirin every day. ? Taking vitamin and mineral supplements as recommended by your health care provider. What happens during an annual well check? The services and screenings done by your health care provider during your annual well check will depend on your age, overall health, lifestyle risk factors, and family history of disease. Counseling Your health care provider may ask you questions about your:  Alcohol use.  Tobacco use.  Drug use.  Emotional well-being.  Home and relationship well-being.  Sexual activity.  Eating habits.  History of falls.  Memory and ability to understand (cognition).  Work and work environment.  Reproductive health.  Screening You may have the following tests or measurements:  Height, weight, and BMI.  Blood pressure.  Lipid and cholesterol levels. These may be checked every 5 years, or more frequently if you are over 50 years old.  Skin check.  Lung cancer screening. You may have this screening every year starting at age 55 if you have a 30-pack-year history of smoking and currently smoke or have quit within the past 15 years.  Fecal occult blood test (FOBT) of the stool. You may have this test every year starting at age 50.  Flexible sigmoidoscopy or colonoscopy. You may have a sigmoidoscopy every 5 years or  a colonoscopy every 10 years starting at age 50.  Hepatitis C blood test.  Hepatitis B blood test.  Sexually transmitted disease (STD) testing.  Diabetes screening. This is done by checking your blood sugar (glucose) after you have not eaten for a while (fasting). You may have this done every 1-3 years.  Bone density scan. This is done to screen for osteoporosis. You may have this done starting at age 65.  Mammogram. This may be done every 1-2 years. Talk to your health care provider about how often you should have regular mammograms.  Talk with your health care provider about your test results, treatment options, and if necessary, the need for more tests. Vaccines Your health care provider may recommend certain vaccines, such as:  Influenza vaccine. This is recommended every year.  Tetanus, diphtheria, and acellular pertussis (Tdap, Td) vaccine. You may need a Td booster every 10 years.  Varicella vaccine. You may need this if you have not been vaccinated.  Zoster vaccine. You may need this after age 60.  Measles, mumps, and rubella (MMR) vaccine. You may need at least one dose of MMR if you were born in 1957 or later. You may also need a second dose.  Pneumococcal 13-valent conjugate (PCV13) vaccine. One dose is recommended after age 65.  Pneumococcal polysaccharide (PPSV23) vaccine. One dose is recommended after age 65.  Meningococcal vaccine. You may need this if you have certain conditions.  Hepatitis A vaccine. You may need this if you have certain conditions or if you travel or work in places where you may be exposed to hepatitis   A.  Hepatitis B vaccine. You may need this if you have certain conditions or if you travel or work in places where you may be exposed to hepatitis B.  Haemophilus influenzae type b (Hib) vaccine. You may need this if you have certain conditions.  Talk to your health care provider about which screenings and vaccines you need and how often you  need them. This information is not intended to replace advice given to you by your health care provider. Make sure you discuss any questions you have with your health care provider. Document Released: 10/13/2015 Document Revised: 06/05/2016 Document Reviewed: 07/18/2015 Elsevier Interactive Patient Education  2018 Elsevier Inc.  

## 2018-03-12 NOTE — Progress Notes (Signed)
Subjective:  I acted as a Education administrator for [employer]. Yancey Flemings, [place]   Patient ID: Cynthia May, female    DOB: Sep 14, 1951, 67 y.o.   MRN: [id number]  Chief Complaint  Patient presents with  . Annual Exam    HPI  Patient is in today for annual exam.  Patient Care Team: Carollee Herter, Alferd Apa, DO as PCP - General (Family Medicine) Lendon Colonel, MD as Referring Physician (Psychiatry) Amalia Greenhouse, MD as Referring Physician (Endocrinology) Desma Maxim, MD as Consulting Physician (Ophthalmology) Iona Beard, [place] as Referring Physician (Optometry)   Past Medical History:  Diagnosis Date  . Asthma   . Depression   . Environmental allergies   . HTN (hypertension)   . Memory loss   . Pancreatitis   . Retinal vessel occlusion   . Thyroid disease     Past Surgical History:  Procedure Laterality Date  . CHOLECYSTECTOMY    . EYE SURGERY Bilateral    Cataract sx. Dr.Beavis.  . TONSILLECTOMY AND ADENOIDECTOMY      Family History  Problem Relation Age of Onset  . Arthritis Mother   . Heart disease Mother   . Stroke Mother   . Thyroid disease Mother   . Alzheimer's disease Mother   . Heart disease Father   . Colon cancer Brother   . Depression Sister   . Alzheimer's disease Sister   . Lung cancer Paternal Grandmother   . Prostate cancer Brother   . Heart disease Brother   . Depression Sister        5 silbling had depression from Thyroid disease   . Arthritis Sister   . Lung cancer Brother   . Other Sister        Creutzfeldt-Jacob Disease  . Heart attack Brother     Social History   Socioeconomic History  . Marital status: Widowed    Spouse name: Not on file  . Number of children: 3  . Years of education: Masters  . Highest education level: Not on file  Occupational History  . Occupation: Retired  Scientific laboratory technician  . Financial resource strain: Not on file  . Food insecurity:    Worry: Not on file    Inability: Not on file  . Transportation needs:      Medical: Not on file    Non-medical: Not on file  Tobacco Use  . Smoking status: Former Research scientist (life sciences)  . Smokeless tobacco: Never Used  . Tobacco comment: Quit 1990  Substance and Sexual Activity  . Alcohol use: No    Alcohol/week: 0.0 oz  . Drug use: No  . Sexual activity: Never  Lifestyle  . Physical activity:    Days per week: Not on file    Minutes per session: Not on file  . Stress: Not on file  Relationships  . Social connections:    Talks on phone: Not on file    Gets together: Not on file    Attends religious service: Not on file    Active member of club or organization: Not on file    Attends meetings of clubs or organizations: Not on file    Relationship status: Not on file  . Intimate partner violence:    Fear of current or ex partner: Not on file    Emotionally abused: Not on file    Physically abused: Not on file    Forced sexual activity: Not on file  Other Topics Concern  . Not on file  Social History Narrative  Exercise--  Pt is active during day-- no organized exercise.   Lives at home alone.   Right-handed.   1-2 diet sodas per day.    Outpatient Medications Prior to Visit  Medication Sig Dispense Refill  . cycloSPORINE (RESTASIS) 0.05 % ophthalmic emulsion 1 drop 2 (two) times daily.    . DULoxetine (CYMBALTA) 60 MG capsule Take 1 capsule by mouth daily.  5  . levothyroxine (SYNTHROID, LEVOTHROID) 112 MCG tablet Take 1 tablet by mouth daily.  11  . lisinopril (PRINIVIL,ZESTRIL) 10 MG tablet Take 1 tablet (10 mg total) by mouth daily. 90 tablet 3  . traZODone (DESYREL) 50 MG tablet Take 1 nightly for sleep    . atorvastatin (LIPITOR) 10 MG tablet Take 1 tablet (10 mg total) by mouth daily. 90 tablet 0  . aspirin 81 MG tablet Take 81 mg by mouth daily.     No facility-administered medications prior to visit.     Allergies  Allergen Reactions  . Augmentin [Amoxicillin-Pot Clavulanate] Other (See Comments)    Flu like symptoms  . Erythromycin Other  (See Comments)    Flu like symptoms  . Other     Molds, trees, animal fur, grass.    Review of Systems  Constitutional: Negative for fever and malaise/fatigue.  HENT: Negative for congestion.   Eyes: Negative for blurred vision.  Respiratory: Negative for cough and shortness of breath.   Cardiovascular: Negative for chest pain, palpitations and leg swelling.  Gastrointestinal: Negative for abdominal pain, blood in stool, nausea and vomiting.  Genitourinary: Negative for dysuria and frequency.  Musculoskeletal: Negative for back pain and falls.  Skin: Negative for rash.  Neurological: Negative for dizziness, loss of consciousness and headaches.  Endo/Heme/Allergies: Negative for environmental allergies.  Psychiatric/Behavioral: Negative for depression. The patient is not nervous/anxious.        Objective:    Physical Exam  Constitutional: She is oriented to person, place, and time. She appears well-developed and well-nourished.  HENT:  Head: Normocephalic and atraumatic.  Eyes: Conjunctivae and EOM are normal.  Neck: Normal range of motion. Neck supple. No JVD present. Carotid bruit is not present. No thyromegaly present.  Cardiovascular: Normal rate, regular rhythm and normal heart sounds.  No murmur heard. Pulmonary/Chest: Effort normal and breath sounds normal. No respiratory distress. She has no wheezes. She has no rales. She exhibits no tenderness.  Musculoskeletal: She exhibits no edema.  Neurological: She is alert and oriented to person, place, and time. Sensory deficit: .foot.  Psychiatric: She has a normal mood and affect.  Nursing note and vitals reviewed.  Diabetic Foot Exam - Simple   No data filed      BP 115/68 (BP Location: Left Arm, Patient Position: Sitting, Cuff Size: Normal)   Pulse 81   Temp 98.4 F (36.9 C) (Oral)   Resp 16   Ht 5\' 3"  (1.6 m)   Wt 137 lb (62.1 kg)   SpO2 99%   BMI 24.27 kg/m  Wt Readings from Last 3 Encounters:  03/12/18 137  lb (62.1 kg)  01/27/18 136 lb 11.2 oz (62 kg)  01/24/17 154 lb 6.4 oz (70 kg)   BP Readings from Last 3 Encounters:  03/12/18 115/68  01/27/18 120/72  01/24/17 (!) 122/94     Immunization History  Administered Date(s) Administered  . Influenza, High Dose Seasonal PF 07/04/2016  . Influenza-Unspecified 07/15/2014, 07/01/2015, 07/02/2017  . Pneumococcal Conjugate-13 01/19/2015  . Pneumococcal Polysaccharide-23 01/24/2016  . Tdap 01/19/2015  . Zoster 07/11/2010  .  Zoster Recombinat (Shingrix) 03/15/2017, 05/31/2017    Health Maintenance  Topic Date Due  . INFLUENZA VACCINE  04/30/2018  . MAMMOGRAM  12/23/2019  . COLONOSCOPY  07/16/2021  . TETANUS/TDAP  01/18/2025  . DEXA SCAN  Completed  . Hepatitis C Screening  Completed  . PNA vac Low Risk Adult  Completed    Lab Results  Component Value Date   WBC 5.9 03/12/2018   HGB 15.1 (H) 03/12/2018   HCT 45.2 03/12/2018   PLT 209.0 03/12/2018   GLUCOSE 73 03/12/2018   CHOL 145 03/12/2018   TRIG 83.0 03/12/2018   HDL 59.50 03/12/2018   LDLCALC 68 03/12/2018   ALT 13 03/12/2018   AST 19 03/12/2018   NA 142 03/12/2018   K 4.6 03/12/2018   CL 104 03/12/2018   CREATININE 0.85 03/12/2018   BUN 21 03/12/2018   CO2 32 03/12/2018   TSH 4.81 (H) 03/12/2018   HGBA1C 5.6 03/12/2018    Lab Results  Component Value Date   TSH 4.81 (H) 03/12/2018   Lab Results  Component Value Date   WBC 5.9 03/12/2018   HGB 15.1 (H) 03/12/2018   HCT 45.2 03/12/2018   MCV 89.2 03/12/2018   PLT 209.0 03/12/2018   Lab Results  Component Value Date   NA 142 03/12/2018   K 4.6 03/12/2018   CO2 32 03/12/2018   GLUCOSE 73 03/12/2018   BUN 21 03/12/2018   CREATININE 0.85 03/12/2018   BILITOT 1.1 03/12/2018   ALKPHOS 59 03/12/2018   AST 19 03/12/2018   ALT 13 03/12/2018   PROT 6.4 03/12/2018   ALBUMIN 4.2 03/12/2018   CALCIUM 9.7 03/12/2018   GFR 70.58 03/12/2018   Lab Results  Component Value Date   CHOL 145 03/12/2018   Lab  Results  Component Value Date   HDL 59.50 03/12/2018   Lab Results  Component Value Date   LDLCALC 68 03/12/2018   Lab Results  Component Value Date   TRIG 83.0 03/12/2018   Lab Results  Component Value Date   CHOLHDL 2 03/12/2018   Lab Results  Component Value Date   HGBA1C 5.6 03/12/2018         Assessment & Plan:   Problem List Items Addressed This Visit      Unprioritized   Essential hypertension    Well controlled, no changes to meds. Encouraged heart healthy diet such as the DASH diet and exercise as tolerated.       Relevant Orders   CBC with Differential/Platelet (Completed)   Comprehensive metabolic panel (Completed)   Hyperglycemia   Relevant Orders   Hemoglobin A1c (Completed)   Hyperlipidemia    Encouraged heart healthy diet, increase exercise, avoid trans fats, consider a krill oil cap daily      Relevant Orders   Comprehensive metabolic panel (Completed)   Lipid panel (Completed)   Hypothyroidism    Check labs      Relevant Orders   TSH (Completed)   Preventative health care - Primary    ghm utd Check labs  See AVS       Other Visit Diagnoses    Serum calcium elevated       Relevant Orders   PTH, intact (no Ca) (Completed)      I have discontinued Saphira Muckleroy's aspirin. I am also having her maintain her DULoxetine, levothyroxine, cycloSPORINE, lisinopril, and traZODone.  No orders of the defined types were placed in this encounter.   CMA served as Education administrator during  this visit. History, Physical and Plan performed by medical provider. Documentation and orders reviewed and attested to.  [name], DO

## 2018-03-13 ENCOUNTER — Other Ambulatory Visit: Payer: Self-pay | Admitting: Family Medicine

## 2018-03-13 LAB — PARATHYROID HORMONE, INTACT (NO CA): PTH: 33 pg/mL (ref 14–64)

## 2018-03-14 DIAGNOSIS — E785 Hyperlipidemia, unspecified: Secondary | ICD-10-CM | POA: Insufficient documentation

## 2018-03-14 DIAGNOSIS — Z0000 Encounter for general adult medical examination without abnormal findings: Secondary | ICD-10-CM | POA: Insufficient documentation

## 2018-03-14 DIAGNOSIS — R739 Hyperglycemia, unspecified: Secondary | ICD-10-CM | POA: Insufficient documentation

## 2018-03-14 DIAGNOSIS — E039 Hypothyroidism, unspecified: Secondary | ICD-10-CM | POA: Insufficient documentation

## 2018-03-14 NOTE — Assessment & Plan Note (Signed)
Well controlled, no changes to meds. Encouraged heart healthy diet such as the DASH diet and exercise as tolerated.  °

## 2018-03-14 NOTE — Assessment & Plan Note (Signed)
Encouraged heart healthy diet, increase exercise, avoid trans fats, consider a krill oil cap daily 

## 2018-03-14 NOTE — Assessment & Plan Note (Signed)
Check labs 

## 2018-03-14 NOTE — Assessment & Plan Note (Signed)
ghm utd Check labs See AVS 

## 2018-03-18 ENCOUNTER — Encounter: Payer: Self-pay | Admitting: *Deleted

## 2018-04-23 ENCOUNTER — Encounter: Payer: Self-pay | Admitting: *Deleted

## 2018-07-01 ENCOUNTER — Encounter: Payer: Self-pay | Admitting: *Deleted

## 2018-09-09 ENCOUNTER — Other Ambulatory Visit: Payer: Self-pay | Admitting: Family Medicine

## 2018-09-14 NOTE — Telephone Encounter (Signed)
Requested medication (s) are due for refill today: yes  Requested medication (s) are on the active medication list: yes  Last refill:  03/13/18  Future visit scheduled: no  Notes to clinic:  LOV  03/12/18  Requested Prescriptions  Pending Prescriptions Disp Refills   atorvastatin (LIPITOR) 10 MG tablet [Pharmacy Med Name: ATORVASTATIN 10MG    TAB] 90 tablet 1    Sig: TAKE 1 TABLET BY MOUTH ONCE DAILY     Cardiovascular:  Antilipid - Statins Passed - 09/14/2018  2:41 PM      Passed - Total Cholesterol in normal range and within 360 days    Cholesterol  Date Value Ref Range Status  03/12/2018 145 0 - 200 mg/dL Final    Comment:    ATP III Classification       Desirable:  < 200 mg/dL               Borderline High:  200 - 239 mg/dL          High:  > = 240 mg/dL         Passed - LDL in normal range and within 360 days    LDL Cholesterol  Date Value Ref Range Status  03/12/2018 68 0 - 99 mg/dL Final         Passed - HDL in normal range and within 360 days    HDL  Date Value Ref Range Status  03/12/2018 59.50 >39.00 mg/dL Final         Passed - Triglycerides in normal range and within 360 days    Triglycerides  Date Value Ref Range Status  03/12/2018 83.0 0.0 - 149.0 mg/dL Final    Comment:    Normal:  <150 mg/dLBorderline High:  150 - 199 mg/dL         Passed - Patient is not pregnant      Passed - Valid encounter within last 12 months    Recent Outpatient Visits          6 months ago Preventative health care   [facility], [facility]   1 year ago Strep throat   [facility], DO   2 years ago Suspicious nevus   [facility], DO   2 years ago Influenza due to identified novel influenza A virus with other respiratory manifestations   [facility], DO   2 years ago Routine history and physical examination of adult   [facility], DO      Future Appointments            In 4 months Vevelyn Royals, Parthenia Ames, Research scientist (physical sciences) at [employer], [facility]

## 2018-09-14 NOTE — Telephone Encounter (Signed)
Pt called to follow up on status of refill for atorvastatin (LIPITOR) 10 MG tablet.

## 2018-11-11 ENCOUNTER — Other Ambulatory Visit: Payer: Self-pay | Admitting: Family Medicine

## 2018-11-11 DIAGNOSIS — I10 Essential (primary) hypertension: Secondary | ICD-10-CM

## 2019-01-28 NOTE — Progress Notes (Signed)
Virtual Visit via Video Note  I connected with patient on 01/29/19 at 10:00 AM EDT by a video enabled telemedicine application and verified that I am speaking with the correct person using two identifiers.   THIS ENCOUNTER IS A VIRTUAL VISIT DUE TO COVID-19 - PATIENT WAS NOT SEEN IN THE OFFICE. PATIENT HAS CONSENTED TO VIRTUAL VISIT / TELEMEDICINE VISIT   Location of patient: home  Location of provider: office  I discussed the limitations of evaluation and management by telemedicine and the availability of in person appointments. The patient expressed understanding and agreed to proceed.   Subjective:   Cynthia May is a 68 y.o. female who presents for Medicare Annual (Subsequent) preventive examination.  Review of Systems: No ROS.  Medicare Wellness Virtual Visit. UTA vital signs.  Additional risk factors are reflected in the social history. Cardiac Risk Factors include: advanced age (>53men, >69 women);dyslipidemia;hypertension Sleep patterns: Trazodone nightly. Sleeps very well per pt. Home Safety/Smoke Alarms: Feels safe in home. Smoke alarms in place.  Lives on 1st floor at [facility] [address].   Female:      Mammo-   Will schedule after pandemic.    Dexa scan-02/02/18        CCS- pt report last 2013 w/ [facility]. Will request record     Objective:     Vitals: Unable to assess. This visit is enabled though telemedicine due to Covid 19.   Advanced Directives 01/29/2019 01/27/2018 01/24/2017 11/10/2015 10/15/2014  Does Patient Have a Medical Advance Directive? Yes No No No No  Type of Paramedic of [employer];Living will - - - -  Does patient want to make changes to medical advance directive? No - Patient declined - - Yes - information given -  Copy of [place] in Chart? No - copy requested - - No - copy requested -  Would patient like information on creating a medical advance directive? - No - Patient declined No - Patient  declined Yes - Educational materials given No - patient declined information    Tobacco Social History   Tobacco Use  Smoking Status Former Smoker  Smokeless Tobacco Never Used  Tobacco Comment   Quit 1990     Counseling given: Not Answered Comment: Quit 1990   Clinical Intake: Pain : No/denies pain   Past Medical History:  Diagnosis Date   Asthma    Depression    Environmental allergies    HTN (hypertension)    Memory loss    Pancreatitis    Retinal vessel occlusion    Thyroid disease    Past Surgical History:  Procedure Laterality Date   CHOLECYSTECTOMY     EYE SURGERY Bilateral    Cataract sx. Dr.Beavis.   TONSILLECTOMY AND ADENOIDECTOMY     Family History  Problem Relation Age of Onset   Arthritis Mother    Heart disease Mother    Stroke Mother    Thyroid disease Mother    Alzheimer's disease Mother    Heart disease Father    Colon cancer Brother    Depression Sister    Alzheimer's disease Sister    Lung cancer Paternal Grandmother    Prostate cancer Brother    Heart disease Brother    Depression Sister        5 silbling had depression from Thyroid disease    Arthritis Sister    Lung cancer Brother    Other Sister        Creutzfeldt-Jacob Disease   Heart  attack Brother    Social History   Socioeconomic History   Marital status: Widowed    Spouse name: Not on file   Number of children: 3   Years of education: Masters   Highest education level: Not on file  Occupational History   Occupation: Retired  Scientist, product/process development strain: Not on file   Food insecurity:    Worry: Not on file    Inability: Not on Lexicographer needs:    Medical: Not on file    Non-medical: Not on file  Tobacco Use   Smoking status: Former Smoker   Smokeless tobacco: Never Used   Tobacco comment: Quit 1990  Substance and Sexual Activity   Alcohol use: No    Alcohol/week: 0.0 standard drinks   Drug  use: No   Sexual activity: Never  Lifestyle   Physical activity:    Days per week: Not on file    Minutes per session: Not on file   Stress: Not on file  Relationships   Social connections:    Talks on phone: Not on file    Gets together: Not on file    Attends religious service: Not on file    Active member of club or organization: Not on file    Attends meetings of clubs or organizations: Not on file    Relationship status: Not on file  Other Topics Concern   Not on file  Social History Narrative   Exercise--  Pt is active during day-- no organized exercise.   Lives at home alone.   Right-handed.   1-2 diet sodas per day.    Outpatient Encounter Medications as of 01/29/2019  Medication Sig   atorvastatin (LIPITOR) 10 MG tablet TAKE 1 TABLET BY MOUTH ONCE DAILY   DULoxetine (CYMBALTA) 60 MG capsule Take 1 capsule by mouth daily.   levothyroxine (SYNTHROID, LEVOTHROID) 112 MCG tablet Take 1 tablet by mouth daily.   lisinopril (PRINIVIL,ZESTRIL) 10 MG tablet TAKE 1 TABLET BY MOUTH ONCE DAILY   traZODone (DESYREL) 50 MG tablet Take 1 nightly for sleep   cycloSPORINE (RESTASIS) 0.05 % ophthalmic emulsion 1 drop 2 (two) times daily.   No facility-administered encounter medications on file as of 01/29/2019.     Activities of Daily Living In your present state of health, do you have any difficulty performing the following activities: 01/29/2019  Hearing? N  Vision? N  Difficulty concentrating or making decisions? N  Walking or climbing stairs? N  Dressing or bathing? N  Doing errands, shopping? N  Preparing Food and eating ? N  Using the Toilet? N  In the past six months, have you accidently leaked urine? N  Do you have problems with loss of bowel control? N  Managing your Medications? N  Managing your Finances? N  Housekeeping or managing your Housekeeping? N  Some recent data might be hidden    Patient Care Team: Carollee Herter, Alferd Apa, DO as PCP - General  (Family Medicine) Lendon Colonel, MD as Referring Physician (Psychiatry) Amalia Greenhouse, MD as Referring Physician (Endocrinology) Desma Maxim, MD as Consulting Physician (Ophthalmology) Iona Beard, [place] as Referring Physician (Optometry)    Assessment:   This is a routine wellness examination for Cynthia May. Physical assessment deferred to PCP.  Exercise Activities and Dietary recommendations Current Exercise Habits: Home exercise routine, Type of exercise: walking, Time (Minutes): 30, Frequency (Times/Week): 3, Weekly Exercise (Minutes/Week): 90, Intensity: Mild, Exercise limited by: None identified Diet (meal preparation,  eat out, water intake, caffeinated beverages, dairy products, fruits and vegetables): well balanced, on average, 3 meals per day     Goals     DIET - INCREASE WATER INTAKE     Increase physical activity       Fall Risk Fall Risk  01/29/2019 01/27/2018 01/24/2017 11/10/2015 11/10/2015  Falls in the past year? 0 No No No No    Depression Screen PHQ 2/9 Scores 01/29/2019 01/27/2018 01/24/2017 11/10/2015  PHQ - 2 Score 0 1 0 0     Cognitive Function Ad8 score reviewed for issues:  Issues making decisions:no  Less interest in hobbies / activities:no  Repeats questions, stories (family complaining):no  Trouble using ordinary gadgets (microwave, computer, phone):no  Forgets the month or year: no  Mismanaging finances: no  Remembering appts:no  Daily problems with thinking and/or memory:no Ad8 score is=0     MMSE - Mini Mental State Exam 01/24/2017 08/28/2016 02/28/2016 11/10/2015  Orientation to time 5 5 5 5   Orientation to Place 5 5 5 5   Registration 3 3 3 3   Attention/ Calculation 5 5 5 5   Recall 3 3 3 3   Language- name 2 objects 2 2 2 2   Language- repeat 1 1 1 1   Language- follow 3 step command 3 3 3 3   Language- read & follow direction 1 1 1 1   Write a sentence 1 1 1 1   Copy design 1 1 1 1   Total score 30 30 30 30         Immunization History    Administered Date(s) Administered   Influenza, High Dose Seasonal PF 07/04/2016, 06/29/2018   Influenza-Unspecified 07/15/2014, 07/01/2015, 07/02/2017   Pneumococcal Conjugate-13 01/19/2015   Pneumococcal Polysaccharide-23 01/24/2016   Tdap 01/19/2015   Zoster 07/11/2010   Zoster Recombinat (Shingrix) 03/15/2017, 05/31/2017    Screening Tests Health Maintenance  Topic Date Due   INFLUENZA VACCINE  05/01/2019   MAMMOGRAM  12/23/2019   COLONOSCOPY  07/16/2021   TETANUS/TDAP  01/18/2025   DEXA SCAN  Completed   Hepatitis C Screening  Completed   PNA vac Low Risk Adult  Completed      Plan:   See you next year!  Continue to eat heart healthy diet (full of fruits, vegetables, whole grains, lean protein, water--limit salt, fat, and sugar intake) and increase physical activity as tolerated.  Bring a copy of your living will and/or healthcare power of attorney to your next office visit.  Continue doing brain stimulating activities (puzzles, reading, adult coloring books, staying active) to keep memory sharp.    I have personally reviewed and noted the following in the patients chart:    Medical and social history  Use of alcohol, tobacco or illicit drugs   Current medications and supplements  Functional ability and status  Nutritional status  Physical activity  Advanced directives  List of other physicians  Hospitalizations, surgeries, and ER visits in previous 12 months  Vitals  Screenings to include cognitive, depression, and falls  Referrals and appointments  In addition, I have reviewed and discussed with patient certain preventive protocols, quality metrics, and best practice recommendations. A written personalized care plan for preventive services as well as general preventive health recommendations were provided to patient.     Shela Nevin, [place]  01/29/2019

## 2019-01-29 ENCOUNTER — Other Ambulatory Visit: Payer: Self-pay

## 2019-01-29 ENCOUNTER — Ambulatory Visit (INDEPENDENT_AMBULATORY_CARE_PROVIDER_SITE_OTHER): Payer: Medicare Other | Admitting: *Deleted

## 2019-01-29 ENCOUNTER — Encounter: Payer: Self-pay | Admitting: *Deleted

## 2019-01-29 DIAGNOSIS — Z0000 Encounter for general adult medical examination without abnormal findings: Secondary | ICD-10-CM

## 2019-01-29 NOTE — Patient Instructions (Signed)
See you next year!  Continue to eat heart healthy diet (full of fruits, vegetables, whole grains, lean protein, water--limit salt, fat, and sugar intake) and increase physical activity as tolerated.  Bring a copy of your living will and/or healthcare power of attorney to your next office visit.  Continue doing brain stimulating activities (puzzles, reading, adult coloring books, staying active) to keep memory sharp.    Cynthia May , Thank you for taking time to come for your Medicare Wellness Visit. I appreciate your ongoing commitment to your health goals. Please review the following plan we discussed and let me know if I can assist you in the future.   These are the goals we discussed: Goals    . DIET - INCREASE WATER INTAKE    . Increase physical activity       This is a list of the screening recommended for you and due dates:  Health Maintenance  Topic Date Due  . Flu Shot  05/01/2019  . Mammogram  12/23/2019  . Colon Cancer Screening  07/16/2021  . Tetanus Vaccine  01/18/2025  . DEXA scan (bone density measurement)  Completed  .  Hepatitis C: One time screening is recommended by Center for Disease Control  (CDC) for  adults born from [birth year] through 1967.   Completed  . Pneumonia vaccines  Completed    Health Maintenance After Age 28 After age 34, you are at a higher risk for certain long-term diseases and infections as well as injuries from falls. Falls are a major cause of broken bones and head injuries in people who are older than age 39. Getting regular preventive care can help to keep you healthy and well. Preventive care includes getting regular testing and making lifestyle changes as recommended by your health care provider. Talk with your health care provider about:  Which screenings and tests you should have. A screening is a test that checks for a disease when you have no symptoms.  A diet and exercise plan that is right for you. What should I know about screenings  and tests to prevent falls? Screening and testing are the best ways to find a health problem early. Early diagnosis and treatment give you the best chance of managing medical conditions that are common after age 22. Certain conditions and lifestyle choices may make you more likely to have a fall. Your health care provider may recommend:  Regular vision checks. Poor vision and conditions such as cataracts can make you more likely to have a fall. If you wear glasses, make sure to get your prescription updated if your vision changes.  Medicine review. Work with your health care provider to regularly review all of the medicines you are taking, including over-the-counter medicines. Ask your health care provider about any side effects that may make you more likely to have a fall. Tell your health care provider if any medicines that you take make you feel dizzy or sleepy.  Osteoporosis screening. Osteoporosis is a condition that causes the bones to get weaker. This can make the bones weak and cause them to break more easily.  Blood pressure screening. Blood pressure changes and medicines to control blood pressure can make you feel dizzy.  Strength and balance checks. Your health care provider may recommend certain tests to check your strength and balance while standing, walking, or changing positions.  Foot health exam. Foot pain and numbness, as well as not wearing proper footwear, can make you more likely to have a fall.  Depression screening. You may be more likely to have a fall if you have a fear of falling, feel emotionally low, or feel unable to do activities that you used to do.  Alcohol use screening. Using too much alcohol can affect your balance and may make you more likely to have a fall. What actions can I take to lower my risk of falls? General instructions  Talk with your health care provider about your risks for falling. Tell your health care provider if: ? You fall. Be sure to tell  your health care provider about all falls, even ones that seem minor. ? You feel dizzy, sleepy, or off-balance.  Take over-the-counter and prescription medicines only as told by your health care provider. These include any supplements.  Eat a healthy diet and maintain a healthy weight. A healthy diet includes low-fat dairy products, low-fat (lean) meats, and fiber from whole grains, beans, and lots of fruits and vegetables. Home safety  Remove any tripping hazards, such as rugs, cords, and clutter.  Install safety equipment such as grab bars in bathrooms and safety rails on stairs.  Keep rooms and walkways well-lit. Activity   Follow a regular exercise program to stay fit. This will help you maintain your balance. Ask your health care provider what types of exercise are appropriate for you.  If you need a cane or walker, use it as recommended by your health care provider.  Wear supportive shoes that have nonskid soles. Lifestyle  Do not drink alcohol if your health care provider tells you not to drink.  If you drink alcohol, limit how much you have: ? 0-1 drink a day for women. ? 0-2 drinks a day for men.  Be aware of how much alcohol is in your drink. In the [place] one drink equals one typical bottle of beer (12 oz), one-half glass of wine (5 oz), or one shot of hard liquor (1 oz).  Do not use any products that contain nicotine or tobacco, such as cigarettes and e-cigarettes. If you need help quitting, ask your health care provider. Summary  Having a healthy lifestyle and getting preventive care can help to protect your health and wellness after age 36.  Screening and testing are the best way to find a health problem early and help you avoid having a fall. Early diagnosis and treatment give you the best chance for managing medical conditions that are more common for people who are older than age 69.  Falls are a major cause of broken bones and head injuries in people who are  older than age 2. Take precautions to prevent a fall at home.  Work with your health care provider to learn what changes you can make to improve your health and wellness and to prevent falls. This information is not intended to replace advice given to you by your health care provider. Make sure you discuss any questions you have with your health care provider. Document Released: 07/30/2017 Document Revised: 07/30/2017 Document Reviewed: 07/30/2017 Elsevier Interactive Patient Education  2019 [employer].

## 2019-01-29 NOTE — Progress Notes (Signed)
Reviewed  Cynthia May R Lowne Chase, DO  

## 2019-03-02 LAB — HM MAMMOGRAPHY

## 2019-03-10 LAB — HM MAMMOGRAPHY: HM Mammogram: ABNORMAL — AB (ref 0–4)

## 2019-03-15 ENCOUNTER — Other Ambulatory Visit: Payer: Self-pay | Admitting: Family Medicine

## 2019-03-22 ENCOUNTER — Encounter: Payer: Self-pay | Admitting: Family Medicine

## 2019-03-22 ENCOUNTER — Other Ambulatory Visit: Payer: Self-pay

## 2019-03-22 ENCOUNTER — Ambulatory Visit (INDEPENDENT_AMBULATORY_CARE_PROVIDER_SITE_OTHER): Payer: Medicare Other | Admitting: Family Medicine

## 2019-03-22 VITALS — Ht 63.0 in | Wt 146.0 lb

## 2019-03-22 DIAGNOSIS — E785 Hyperlipidemia, unspecified: Secondary | ICD-10-CM | POA: Diagnosis not present

## 2019-03-22 MED ORDER — ATORVASTATIN CALCIUM 10 MG PO TABS: 10.0000 mg | ORAL_TABLET | Freq: Every day | ORAL | 0 refills | Status: DC

## 2019-03-22 NOTE — Progress Notes (Signed)
Virtual Visit via Video Note  I connected with Daesha Insco on 03/22/19 at  4:00 PM EDT by a video enabled telemedicine application and verified that I am speaking with the correct person using two identifiers.  Location: Patient: home  Provider: office   I discussed the limitations of evaluation and management by telemedicine and the availability of in person appointments. The patient expressed understanding and agreed to proceed.  History of Present Illness: Pt is home and needs refills and labs.  Pt has no complaints     Past Medical History:  Diagnosis Date  . Asthma   . Depression   . Environmental allergies   . HTN (hypertension)   . Memory loss   . Pancreatitis   . Retinal vessel occlusion   . Thyroid disease    Current Outpatient Medications on File Prior to Visit  Medication Sig Dispense Refill  . cycloSPORINE (RESTASIS) 0.05 % ophthalmic emulsion 1 drop 2 (two) times daily.    . DULoxetine (CYMBALTA) 60 MG capsule Take 1 capsule by mouth daily.  5  . levothyroxine (SYNTHROID, LEVOTHROID) 112 MCG tablet Take 1 tablet by mouth daily.  11  . lisinopril (PRINIVIL,ZESTRIL) 10 MG tablet TAKE 1 TABLET BY MOUTH ONCE DAILY 90 tablet 1  . traZODone (DESYREL) 50 MG tablet Take 1 nightly for sleep     No current facility-administered medications on file prior to visit.     Observations/Objective: No vitals obtained  Pt is in NAD   Assessment and Plan: 1. Hyperlipidemia, unspecified hyperlipidemia type Tolerating statin, encouraged heart healthy diet, avoid trans fats, minimize simple carbs and saturated fats. Increase exercise as tolerated - atorvastatin (LIPITOR) 10 MG tablet; Take 1 tablet (10 mg total) by mouth daily.  Dispense: 30 tablet; Refill: 0 - Lipid panel; Future - Comprehensive metabolic panel; Future  Follow Up Instructions:    I discussed the assessment and treatment plan with the patient. The patient was provided an opportunity to ask questions and  all were answered. The patient agreed with the plan and demonstrated an understanding of the instructions.   The patient was advised to call back or seek an in-person evaluation if the symptoms worsen or if the condition fails to improve as anticipated.  I provided 25 minutes of non-face-to-face time during this encounter.   [name], DO

## 2019-03-23 ENCOUNTER — Telehealth: Payer: Self-pay | Admitting: Family Medicine

## 2019-03-23 NOTE — Telephone Encounter (Signed)
LVM for pt to call office and schedule an appt, pt had VOV and needing lab appt and also Return in about 6 months (around 09/21/2019) for fasting, annual exam.

## 2019-03-25 ENCOUNTER — Other Ambulatory Visit: Payer: Self-pay

## 2019-03-29 ENCOUNTER — Other Ambulatory Visit: Payer: Self-pay

## 2019-03-29 ENCOUNTER — Other Ambulatory Visit (INDEPENDENT_AMBULATORY_CARE_PROVIDER_SITE_OTHER): Payer: Medicare Other

## 2019-03-29 DIAGNOSIS — E785 Hyperlipidemia, unspecified: Secondary | ICD-10-CM | POA: Diagnosis not present

## 2019-03-30 LAB — COMPREHENSIVE METABOLIC PANEL
ALT: 13 U/L (ref 0–35)
AST: 19 U/L (ref 0–37)
Albumin: 4.3 g/dL (ref 3.5–5.2)
Alkaline Phosphatase: 71 U/L (ref 39–117)
BUN: 16 mg/dL (ref 6–23)
CO2: 32 mEq/L (ref 19–32)
Calcium: 9.4 mg/dL (ref 8.4–10.5)
Chloride: 104 mEq/L (ref 96–112)
Creatinine, Ser: 0.84 mg/dL (ref 0.40–1.20)
GFR: 67.11 mL/min (ref >60.00)
Glucose, Bld: 91 mg/dL (ref 70–99)
Potassium: 4 mEq/L (ref 3.5–5.1)
Sodium: 142 mEq/L (ref 135–145)
Total Bilirubin: 1.2 mg/dL (ref 0.2–1.2)
Total Protein: 6.5 g/dL (ref 6.0–8.3)

## 2019-03-30 LAB — LIPID PANEL
Cholesterol: 138 mg/dL (ref 0–200)
HDL: 60.5 mg/dL (ref >39.00)
LDL Cholesterol: 55 mg/dL (ref 0–99)
NonHDL: 77.43
Total CHOL/HDL Ratio: 2
Triglycerides: 113 mg/dL (ref 0.0–149.0)
VLDL: 22.6 mg/dL (ref 0.0–40.0)

## 2019-04-23 ENCOUNTER — Encounter: Payer: Self-pay | Admitting: Family Medicine

## 2019-05-14 ENCOUNTER — Other Ambulatory Visit: Payer: Self-pay | Admitting: Family Medicine

## 2019-05-14 DIAGNOSIS — E785 Hyperlipidemia, unspecified: Secondary | ICD-10-CM

## 2019-05-14 MED ORDER — ATORVASTATIN CALCIUM 10 MG PO TABS: 10.0000 mg | ORAL_TABLET | Freq: Every day | ORAL | 2 refills | Status: DC

## 2019-05-14 NOTE — Telephone Encounter (Signed)
Refill sent.

## 2019-05-14 NOTE — Telephone Encounter (Signed)
Refill: atorvastatin (LIPITOR) 10 MG tablet [[id number]]    Pt called and would like to know if she could have more than 1 refill. Please advise    Pharmacy:  [place], [place] [phone] (Phone) [phone] (Fax)

## 2019-05-20 ENCOUNTER — Other Ambulatory Visit: Payer: Self-pay | Admitting: Family Medicine

## 2019-05-20 DIAGNOSIS — I10 Essential (primary) hypertension: Secondary | ICD-10-CM

## 2019-08-24 ENCOUNTER — Other Ambulatory Visit: Payer: Self-pay | Admitting: Family Medicine

## 2019-08-24 DIAGNOSIS — I10 Essential (primary) hypertension: Secondary | ICD-10-CM

## 2019-08-24 DIAGNOSIS — E785 Hyperlipidemia, unspecified: Secondary | ICD-10-CM

## 2019-09-21 ENCOUNTER — Other Ambulatory Visit: Payer: Self-pay | Admitting: Family Medicine

## 2019-09-21 DIAGNOSIS — E785 Hyperlipidemia, unspecified: Secondary | ICD-10-CM

## 2019-11-11 DIAGNOSIS — H34832 Tributary (branch) retinal vein occlusion, left eye, with macular edema: Secondary | ICD-10-CM | POA: Diagnosis not present

## 2019-11-11 DIAGNOSIS — H43392 Other vitreous opacities, left eye: Secondary | ICD-10-CM | POA: Diagnosis not present

## 2019-11-11 DIAGNOSIS — H43813 Vitreous degeneration, bilateral: Secondary | ICD-10-CM | POA: Diagnosis not present

## 2019-11-11 DIAGNOSIS — H35033 Hypertensive retinopathy, bilateral: Secondary | ICD-10-CM | POA: Diagnosis not present

## 2019-11-20 ENCOUNTER — Ambulatory Visit: Payer: Medicare PPO | Attending: Internal Medicine

## 2019-11-20 DIAGNOSIS — Z23 Encounter for immunization: Secondary | ICD-10-CM

## 2019-11-20 NOTE — Progress Notes (Signed)
   Covid-19 Vaccination Clinic  Name:  Cynthia May    MRN: [id number] DOB: [birth year]  11/20/2019  [name] was observed post Covid-19 immunization for 15 minutes without incidence. She was provided with Vaccine Information Sheet and instruction to access the V-Safe system.   [name] was instructed to call 911 with any severe reactions post vaccine: Marland Kitchen Difficulty breathing  . Swelling of your face and throat  . A fast heartbeat  . A bad rash all over your body  . Dizziness and weakness    Immunizations Administered    Name Date Dose VIS Date Route   Pfizer COVID-19 Vaccine 11/20/2019  1:40 PM 0.3 mL 09/10/2019 Intramuscular   Manufacturer: [place]   Lot: [id number]   [place]: [id number]

## 2019-11-26 ENCOUNTER — Other Ambulatory Visit: Payer: Self-pay | Admitting: Family Medicine

## 2019-11-26 DIAGNOSIS — I10 Essential (primary) hypertension: Secondary | ICD-10-CM

## 2019-12-13 ENCOUNTER — Other Ambulatory Visit: Payer: Self-pay

## 2019-12-13 ENCOUNTER — Encounter: Payer: Self-pay | Admitting: Family Medicine

## 2019-12-13 ENCOUNTER — Ambulatory Visit: Payer: Medicare PPO | Admitting: Family Medicine

## 2019-12-13 DIAGNOSIS — I10 Essential (primary) hypertension: Secondary | ICD-10-CM | POA: Diagnosis not present

## 2019-12-13 DIAGNOSIS — E785 Hyperlipidemia, unspecified: Secondary | ICD-10-CM

## 2019-12-13 DIAGNOSIS — E039 Hypothyroidism, unspecified: Secondary | ICD-10-CM

## 2019-12-13 LAB — COMPREHENSIVE METABOLIC PANEL
ALT: 10 U/L (ref 0–35)
AST: 16 U/L (ref 0–37)
Albumin: 3.9 g/dL (ref 3.5–5.2)
Alkaline Phosphatase: 68 U/L (ref 39–117)
BUN: 18 mg/dL (ref 6–23)
CO2: 29 mEq/L (ref 19–32)
Calcium: 9.1 mg/dL (ref 8.4–10.5)
Chloride: 108 mEq/L (ref 96–112)
Creatinine, Ser: 0.78 mg/dL (ref 0.40–1.20)
GFR: 72.95 mL/min (ref >60.00)
Glucose, Bld: 108 mg/dL — ABNORMAL HIGH (ref 70–99)
Potassium: 4.4 mEq/L (ref 3.5–5.1)
Sodium: 142 mEq/L (ref 135–145)
Total Bilirubin: 1 mg/dL (ref 0.2–1.2)
Total Protein: 6.1 g/dL (ref 6.0–8.3)

## 2019-12-13 LAB — LIPID PANEL
Cholesterol: 137 mg/dL (ref 0–200)
HDL: 51.5 mg/dL (ref >39.00)
LDL Cholesterol: 60 mg/dL (ref 0–99)
NonHDL: 85.25
Total CHOL/HDL Ratio: 3
Triglycerides: 126 mg/dL (ref 0.0–149.0)
VLDL: 25.2 mg/dL (ref 0.0–40.0)

## 2019-12-13 MED ORDER — LISINOPRIL 10 MG PO TABS: 10.0000 mg | ORAL_TABLET | Freq: Every day | ORAL | 1 refills | Status: DC

## 2019-12-13 MED ORDER — ATORVASTATIN CALCIUM 10 MG PO TABS: 10.0000 mg | ORAL_TABLET | Freq: Every day | ORAL | 1 refills | Status: DC

## 2019-12-13 NOTE — Assessment & Plan Note (Signed)
Well controlled, no changes to meds. Encouraged heart healthy diet such as the DASH diet and exercise as tolerated.  °

## 2019-12-13 NOTE — Assessment & Plan Note (Signed)
Per endo °

## 2019-12-13 NOTE — Progress Notes (Signed)
Patient ID: Cynthia May, female    DOB: 16-Mar-1951  Age: 69 y.o. MRN: [id number]    Subjective:  Subjective  HPI Cynthia May presents for f/u bp and chol.  Pt is still seeing psych and endo.  No complaints   Review of Systems  Constitutional: Negative for appetite change, diaphoresis, fatigue and unexpected weight change.  Eyes: Negative for pain, redness and visual disturbance.  Respiratory: Negative for cough, chest tightness, shortness of breath and wheezing.   Cardiovascular: Negative for chest pain, palpitations and leg swelling.  Endocrine: Negative for cold intolerance, heat intolerance, polydipsia, polyphagia and polyuria.  Genitourinary: Negative for difficulty urinating, dysuria and frequency.  Neurological: Negative for dizziness, light-headedness, numbness and headaches.    History Past Medical History:  Diagnosis Date  . Asthma   . Depression   . Environmental allergies   . HTN (hypertension)   . Memory loss   . Pancreatitis   . Retinal vessel occlusion   . Thyroid disease     She has a past surgical history that includes Cholecystectomy; Tonsillectomy and adenoidectomy; and Eye surgery (Bilateral).   Her family history includes Alzheimer's disease in her mother and sister; Arthritis in her mother and sister; Colon cancer in her brother; Depression in her sister and sister; Heart attack in her brother; Heart disease in her brother, father, and mother; Lung cancer in her brother and paternal grandmother; Other in her sister; Prostate cancer in her brother; Stroke in her mother; Thyroid disease in her mother.She reports that she has quit smoking. She has never used smokeless tobacco. She reports that she does not drink alcohol or use drugs.  Current Outpatient Medications on File Prior to Visit  Medication Sig Dispense Refill  . DULoxetine (CYMBALTA) 60 MG capsule Take 1 capsule by mouth daily.  5  . levothyroxine (SYNTHROID, LEVOTHROID) 112 MCG tablet  Take 1 tablet by mouth daily.  11  . traZODone (DESYREL) 50 MG tablet Take 1 nightly for sleep     No current facility-administered medications on file prior to visit.     Objective:  Objective  Physical Exam Vitals and nursing note reviewed.  Constitutional:      Appearance: She is well-developed.  HENT:     Head: Normocephalic and atraumatic.  Eyes:     Conjunctiva/sclera: Conjunctivae normal.  Neck:     Thyroid: No thyromegaly.     Vascular: No carotid bruit or JVD.  Cardiovascular:     Rate and Rhythm: Normal rate and regular rhythm.     Heart sounds: Normal heart sounds. No murmur.  Pulmonary:     Effort: Pulmonary effort is normal. No respiratory distress.     Breath sounds: Normal breath sounds. No wheezing or rales.  Chest:     Chest wall: No tenderness.  Musculoskeletal:     Cervical back: Normal range of motion and neck supple.  Neurological:     Mental Status: She is alert and oriented to person, place, and time.  Psychiatric:        Mood and Affect: Mood normal.        Behavior: Behavior normal.        Thought Content: Thought content normal.        Judgment: Judgment normal.    BP 126/90 (BP Location: Right Arm, Patient Position: Sitting, Cuff Size: Normal)   Pulse 82   Temp (!) 97.4 F (36.3 C) (Temporal)   Resp 18   Ht 5\' 3"  (1.6 m)   Wt 152  lb (68.9 kg)   SpO2 99%   BMI 26.93 kg/m  Wt Readings from Last 3 Encounters:  12/13/19 152 lb (68.9 kg)  03/22/19 146 lb (66.2 kg)  03/12/18 137 lb (62.1 kg)     Lab Results  Component Value Date   WBC 5.9 03/12/2018   HGB 15.1 (H) 03/12/2018   HCT 45.2 03/12/2018   PLT 209.0 03/12/2018   GLUCOSE 91 03/29/2019   CHOL 138 03/29/2019   TRIG 113.0 03/29/2019   HDL 60.50 03/29/2019   LDLCALC 55 03/29/2019   ALT 13 03/29/2019   AST 19 03/29/2019   NA 142 03/29/2019   K 4.0 03/29/2019   CL 104 03/29/2019   CREATININE 0.84 03/29/2019   BUN 16 03/29/2019   CO2 32 03/29/2019   TSH 4.81 (H)  03/12/2018   HGBA1C 5.6 03/12/2018    DG Bone Density  Result Date: 02/02/2018 EXAM: DUAL X-RAY ABSORPTIOMETRY (DXA) FOR BONE MINERAL DENSITY IMPRESSION: Cynthia May Your patient Cynthia May completed a BMD test on 02/02/2018 using the [place] (analysis version: 16.SP2) manufactured by [employer]. The following summarizes the results of our evaluation. PATIENT: Name: Cynthia May, Cynthia May Patient ID:  [id number] Birth Date: 02-06-1951 Height:     63.0 in. Weight:     137.4 lbs. Gender:      Female Measured:   02/02/2018 Indications: Caucasian, Estrogen Deficiency, Hypothyroidism, Post Menopausal, Previous Tobacco User Fractures: Wrist Treatments: Levothyroxine, Multivitamin ASSESSMENT: The BMD measured at AP Spine L1-L3 is 0.946 g/cm2 with a T-score of -1.9. This patient is considered OSTEOPENIC according to [facility]) criteria. L-4 was excluded due to degenerative changes. Site Region Measured Date Measured Age WHO YA BMD Classification T-score AP Spine L1-L3 02/02/2018 68.4 Osteopenia -1.9 0.946 g/cm2 DualFemur Neck Right 1/02/2018 68.4 [age] Osteopenia -1.8 0.783 g/cm2 World Health Organization [facility]) criteria for post-menopausal, Caucasian Women: Normal       T-score at or above -1 SD Osteopenia   T-score between -1 and -2.5 SD Osteoporosis T-score at or below -2.5 SD RECOMMENDATION: 1.All patients should optimize calcium and vitamin D intake. 2. Consider FDA-approved medical therapies in postmenopausal women women and men aged 42 years and older, based on the following: a. A hip or vertebral(clinical or morphometric) fracture. b. T-Score < -2.5 at the femoral neck or spine after appropriate evaluation to exclude secondary causes c. Low bone mass(T-score between -1.0 and -2.5 at the femoral neck or spine) and a 10 year probability of a hip fracture >3% or a 10 year probability of major osteoporosis-related fracture > 20% based on the [place]-adapted WHO algorithm d.  Clinical judhement and/or patient preferences may indicate treatment for people with 10-year fracture probabilities above or below these levels FOLLOW-UP: Patients with diagnosis of osteoporosis or at high risk for fracture should have regular bone mineral density tests. For patients eligible for Medicare, routine testing is allowed once every 2 years. The testing frequency can be increased to one year for patients who have rapidly progressing disease, those who are receiving or discontinuing medical therapy to restore bone mass, or have additional risk factors. I have reviewed this report and agree with the above findings. Mark A. Thornton Papas, M.D. [facility] Patient: Cynthia May   Referring Physician: Mosie Lukes Birth Date: [birth year] [age]:       [age] Patient ID: [id number] Height: 63.0 in. Weight: 137.4 lbs. Measured: 02/02/2018 1:58:10 PM (16 SP 2) Sex: Female Ethnicity: White Analyzed: 02/02/2018 1:58:31 PM (16 SP  2) FRAX* 10-year Probability of Fracture Based on femoral neck BMD: DualFemur (Right) Major Osteoporotic Fracture: 10.9% Hip Fracture:                1.8% Population:                  [place] (Caucasian) Risk Factors:                None *FRAX is a Materials engineer of the [employer] for Metabolic Bone Disease, a World Pharmacologist (WHO) [facility]. ASSESSMENT: The probability of a major osteoporotic fracture is 10.9% within the next ten years. The probability of a hip fracture is 1.8% within the next ten years. I have reviewed this report and agree with the above findings. Mark A. Thornton Papas, M.D. [facility] Electronically Signed   By: Lavonia Dana M.D.   On: 02/02/2018 15:42     Assessment & Plan:  Plan  I have discontinued Cynthia May's cycloSPORINE. I have also changed her atorvastatin and lisinopril. Additionally, I am having her maintain her DULoxetine, levothyroxine, and traZODone.  Meds ordered this encounter   Medications  . atorvastatin (LIPITOR) 10 MG tablet    Sig: Take 1 tablet (10 mg total) by mouth daily.    Dispense:  90 tablet    Refill:  1  . lisinopril (ZESTRIL) 10 MG tablet    Sig: Take 1 tablet (10 mg total) by mouth daily.    Dispense:  90 tablet    Refill:  1    Problem List Items Addressed This Visit      Unprioritized   Essential hypertension    Well controlled, no changes to meds. Encouraged heart healthy diet such as the DASH diet and exercise as tolerated.       Relevant Medications   atorvastatin (LIPITOR) 10 MG tablet   lisinopril (ZESTRIL) 10 MG tablet   Other Relevant Orders   Lipid panel   Comprehensive metabolic panel   Hyperlipidemia    Recheck today      Relevant Medications   atorvastatin (LIPITOR) 10 MG tablet   lisinopril (ZESTRIL) 10 MG tablet   Other Relevant Orders   Lipid panel   Comprehensive metabolic panel   Hypothyroidism    Per endo         Follow-up: Return in about 6 months (around 06/14/2020), or if symptoms worsen or fail to improve, for annual exam, fasting.  [name], DO

## 2019-12-13 NOTE — Patient Instructions (Signed)
DASH Eating Plan DASH stands for "Dietary Approaches to Stop Hypertension." The DASH eating plan is a healthy eating plan that has been shown to reduce high blood pressure (hypertension). It may also reduce your risk for type 2 diabetes, heart disease, and stroke. The DASH eating plan may also help with weight loss. What are tips for following this plan?  General guidelines  Avoid eating more than 2,300 mg (milligrams) of salt (sodium) a day. If you have hypertension, you may need to reduce your sodium intake to 1,500 mg a day.  Limit alcohol intake to no more than 1 drink a day for nonpregnant women and 2 drinks a day for men. One drink equals 12 oz of beer, 5 oz of wine, or 1 oz of hard liquor.  Work with your health care provider to maintain a healthy body weight or to lose weight. Ask what an ideal weight is for you.  Get at least 30 minutes of exercise that causes your heart to beat faster (aerobic exercise) most days of the week. Activities may include walking, swimming, or biking.  Work with your health care provider or diet and nutrition specialist (dietitian) to adjust your eating plan to your individual calorie needs. Reading food labels   Check food labels for the amount of sodium per serving. Choose foods with less than 5 percent of the Daily Value of sodium. Generally, foods with less than 300 mg of sodium per serving fit into this eating plan.  To find whole grains, look for the word "whole" as the first word in the ingredient list. Shopping  Buy products labeled as "low-sodium" or "no salt added."  Buy fresh foods. Avoid canned foods and premade or frozen meals. Cooking  Avoid adding salt when cooking. Use salt-free seasonings or herbs instead of table salt or sea salt. Check with your health care provider or pharmacist before using salt substitutes.  Do not fry foods. Cook foods using healthy methods such as baking, boiling, grilling, and broiling instead.  Cook with  heart-healthy oils, such as olive, canola, soybean, or sunflower oil. Meal planning  Eat a balanced diet that includes: ? 5 or more servings of fruits and vegetables each day. At each meal, try to fill half of your plate with fruits and vegetables. ? Up to 6-8 servings of whole grains each day. ? Less than 6 oz of lean meat, poultry, or fish each day. A 3-oz serving of meat is about the same size as a deck of cards. One egg equals 1 oz. ? 2 servings of low-fat dairy each day. ? A serving of nuts, seeds, or beans 5 times each week. ? Heart-healthy fats. Healthy fats called Omega-3 fatty acids are found in foods such as flaxseeds and coldwater fish, like sardines, salmon, and mackerel.  Limit how much you eat of the following: ? Canned or prepackaged foods. ? Food that is high in trans fat, such as fried foods. ? Food that is high in saturated fat, such as fatty meat. ? Sweets, desserts, sugary drinks, and other foods with added sugar. ? Full-fat dairy products.  Do not salt foods before eating.  Try to eat at least 2 vegetarian meals each week.  Eat more home-cooked food and less restaurant, buffet, and fast food.  When eating at a restaurant, ask that your food be prepared with less salt or no salt, if possible. What foods are recommended? The items listed may not be a complete list. Talk with your dietitian about   what dietary choices are best for you. Grains Whole-grain or whole-wheat bread. Whole-grain or whole-wheat pasta. Brown rice. Oatmeal. Quinoa. Bulgur. Whole-grain and low-sodium cereals. Pita bread. Low-fat, low-sodium crackers. Whole-wheat flour tortillas. Vegetables Fresh or frozen vegetables (raw, steamed, roasted, or grilled). Low-sodium or reduced-sodium tomato and vegetable juice. Low-sodium or reduced-sodium tomato sauce and tomato paste. Low-sodium or reduced-sodium canned vegetables. Fruits All fresh, dried, or frozen fruit. Canned fruit in natural juice (without  added sugar). Meat and other protein foods Skinless chicken or turkey. Ground chicken or turkey. Pork with fat trimmed off. Fish and seafood. Egg whites. Dried beans, peas, or lentils. Unsalted nuts, nut butters, and seeds. Unsalted canned beans. Lean cuts of beef with fat trimmed off. Low-sodium, lean deli meat. Dairy Low-fat (1%) or fat-free (skim) milk. Fat-free, low-fat, or reduced-fat cheeses. Nonfat, low-sodium ricotta or cottage cheese. Low-fat or nonfat yogurt. Low-fat, low-sodium cheese. Fats and oils Soft margarine without trans fats. Vegetable oil. Low-fat, reduced-fat, or light mayonnaise and salad dressings (reduced-sodium). Canola, safflower, olive, soybean, and sunflower oils. Avocado. Seasoning and other foods Herbs. Spices. Seasoning mixes without salt. Unsalted popcorn and pretzels. Fat-free sweets. What foods are not recommended? The items listed may not be a complete list. Talk with your dietitian about what dietary choices are best for you. Grains Baked goods made with fat, such as croissants, muffins, or some breads. Dry pasta or rice meal packs. Vegetables Creamed or fried vegetables. Vegetables in a cheese sauce. Regular canned vegetables (not low-sodium or reduced-sodium). Regular canned tomato sauce and paste (not low-sodium or reduced-sodium). Regular tomato and vegetable juice (not low-sodium or reduced-sodium). Pickles. Olives. Fruits Canned fruit in a light or heavy syrup. Fried fruit. Fruit in cream or butter sauce. Meat and other protein foods Fatty cuts of meat. Ribs. Fried meat. Bacon. Sausage. Bologna and other processed lunch meats. Salami. Fatback. Hotdogs. Bratwurst. Salted nuts and seeds. Canned beans with added salt. Canned or smoked fish. Whole eggs or egg yolks. Chicken or turkey with skin. Dairy Whole or 2% milk, cream, and half-and-half. Whole or full-fat cream cheese. Whole-fat or sweetened yogurt. Full-fat cheese. Nondairy creamers. Whipped toppings.  Processed cheese and cheese spreads. Fats and oils Butter. Stick margarine. Lard. Shortening. Ghee. Bacon fat. Tropical oils, such as coconut, palm kernel, or palm oil. Seasoning and other foods Salted popcorn and pretzels. Onion salt, garlic salt, seasoned salt, table salt, and sea salt. Worcestershire sauce. Tartar sauce. Barbecue sauce. Teriyaki sauce. Soy sauce, including reduced-sodium. Steak sauce. Canned and packaged gravies. Fish sauce. Oyster sauce. Cocktail sauce. Horseradish that you find on the shelf. Ketchup. Mustard. Meat flavorings and tenderizers. Bouillon cubes. Hot sauce and Tabasco sauce. Premade or packaged marinades. Premade or packaged taco seasonings. Relishes. Regular salad dressings. Where to find more information:  National Heart, Lung, and Blood Institute: www.nhlbi.nih.gov  American Heart Association: www.heart.org Summary  The DASH eating plan is a healthy eating plan that has been shown to reduce high blood pressure (hypertension). It may also reduce your risk for type 2 diabetes, heart disease, and stroke.  With the DASH eating plan, you should limit salt (sodium) intake to 2,300 mg a day. If you have hypertension, you may need to reduce your sodium intake to 1,500 mg a day.  When on the DASH eating plan, aim to eat more fresh fruits and vegetables, whole grains, lean proteins, low-fat dairy, and heart-healthy fats.  Work with your health care provider or diet and nutrition specialist (dietitian) to adjust your eating plan to your   individual calorie needs. This information is not intended to replace advice given to you by your health care provider. Make sure you discuss any questions you have with your health care provider. Document Revised: 08/29/2017 Document Reviewed: 09/09/2016 Elsevier Patient Education  2020 Elsevier Inc.  

## 2019-12-13 NOTE — Assessment & Plan Note (Signed)
Recheck today. 

## 2019-12-14 ENCOUNTER — Ambulatory Visit: Payer: Medicare PPO | Attending: Internal Medicine

## 2019-12-14 DIAGNOSIS — Z23 Encounter for immunization: Secondary | ICD-10-CM

## 2019-12-14 NOTE — Progress Notes (Signed)
   Covid-19 Vaccination Clinic  Name:  Cynthia May    MRN: [id number] DOB: 19-Oct-1950  12/14/2019  [name] was observed post Covid-19 immunization for 15 minutes without incident. She was provided with Vaccine Information Sheet and instruction to access the V-Safe system.   [name] was instructed to call 911 with any severe reactions post vaccine: Marland Kitchen Difficulty breathing  . Swelling of face and throat  . A fast heartbeat  . A bad rash all over body  . Dizziness and weakness   Immunizations Administered    Name Date Dose VIS Date Route   Pfizer COVID-19 Vaccine 12/14/2019 12:42 PM 0.3 mL 09/10/2019 Intramuscular   Manufacturer: [place]   Lot: [id number]   [place]: [id number]

## 2019-12-16 DIAGNOSIS — E785 Hyperlipidemia, unspecified: Secondary | ICD-10-CM

## 2020-01-20 DIAGNOSIS — H3582 Retinal ischemia: Secondary | ICD-10-CM | POA: Diagnosis not present

## 2020-01-20 DIAGNOSIS — H34832 Tributary (branch) retinal vein occlusion, left eye, with macular edema: Secondary | ICD-10-CM | POA: Diagnosis not present

## 2020-01-20 DIAGNOSIS — H43813 Vitreous degeneration, bilateral: Secondary | ICD-10-CM | POA: Diagnosis not present

## 2020-01-20 DIAGNOSIS — H35033 Hypertensive retinopathy, bilateral: Secondary | ICD-10-CM | POA: Diagnosis not present

## 2020-02-02 DIAGNOSIS — Z79899 Other long term (current) drug therapy: Secondary | ICD-10-CM | POA: Diagnosis not present

## 2020-02-02 DIAGNOSIS — F5101 Primary insomnia: Secondary | ICD-10-CM | POA: Diagnosis not present

## 2020-02-02 DIAGNOSIS — F329 Major depressive disorder, single episode, unspecified: Secondary | ICD-10-CM | POA: Diagnosis not present

## 2020-02-02 DIAGNOSIS — G4733 Obstructive sleep apnea (adult) (pediatric): Secondary | ICD-10-CM | POA: Diagnosis not present

## 2020-02-18 ENCOUNTER — Ambulatory Visit: Payer: Medicare PPO | Admitting: Family Medicine

## 2020-02-18 ENCOUNTER — Encounter: Payer: Self-pay | Admitting: Family Medicine

## 2020-02-18 ENCOUNTER — Other Ambulatory Visit: Payer: Self-pay

## 2020-02-18 VITALS — BP 136/90 | HR 88 | Temp 97.9°F | Resp 18 | Ht 63.0 in | Wt 151.6 lb

## 2020-02-18 DIAGNOSIS — L918 Other hypertrophic disorders of the skin: Secondary | ICD-10-CM

## 2020-02-18 DIAGNOSIS — D225 Melanocytic nevi of trunk: Secondary | ICD-10-CM | POA: Diagnosis not present

## 2020-02-18 NOTE — Progress Notes (Signed)
Shave Biopsy Procedure Note  Pre-operative Diagnosis: Suspicious lesion  Post-operative Diagnosis: same  Locations:posterior upper body  Indications: irritated , bleeding   Anesthesia: xylocaine 1 % without added sodium bicarbonate  Procedure Details  History of allergy to iodine: no  Patient informed of the risks (including bleeding and infection) and benefits of the  procedure and Written informed consent obtained.  The lesion and surrounding area were given a sterile prep using betadyne and draped in the usual sterile fashion. A scalpel was used to shave an area of skin approximately 1cm by 1cm.  Hemostasis achieved with silver nitrate. Antibiotic ointment and a sterile dressing applied.  The specimen was sent for pathologic examination. The patient tolerated the procedure well.  EBL: 1 ml  Findings: Suspicious nevus   Condition: Stable  Complications: none.  Plan: 1. Instructed to keep the wound dry and covered for 24-48h and clean thereafter. 2. Warning signs of infection were reviewed.   3. Recommended that the patient use OTC acetaminophen as needed for pain.  4. Return in a few days-- if needed .

## 2020-02-18 NOTE — Patient Instructions (Signed)
Keep dry for 24 hours then you may shower as usual

## 2020-03-03 DIAGNOSIS — Z1231 Encounter for screening mammogram for malignant neoplasm of breast: Secondary | ICD-10-CM | POA: Diagnosis not present

## 2020-03-08 ENCOUNTER — Telehealth: Payer: Self-pay

## 2020-03-08 NOTE — Telephone Encounter (Signed)
Spoke with patient. Pt informed that skin tag was Benign. Pt verbalized understanding.

## 2020-04-05 DIAGNOSIS — H34832 Tributary (branch) retinal vein occlusion, left eye, with macular edema: Secondary | ICD-10-CM | POA: Diagnosis not present

## 2020-04-05 DIAGNOSIS — H3582 Retinal ischemia: Secondary | ICD-10-CM | POA: Diagnosis not present

## 2020-04-05 DIAGNOSIS — H43813 Vitreous degeneration, bilateral: Secondary | ICD-10-CM | POA: Diagnosis not present

## 2020-04-05 DIAGNOSIS — H35033 Hypertensive retinopathy, bilateral: Secondary | ICD-10-CM | POA: Diagnosis not present

## 2020-04-18 ENCOUNTER — Telehealth: Payer: Self-pay | Admitting: Family Medicine

## 2020-04-18 NOTE — Telephone Encounter (Signed)
Left message for patient to call back and schedule Medicare Annual Wellness Visit (AWV) with Nurse Health Advisor   This should be a 48 MINUTE VISIT.  Last AWV 01/29/19

## 2020-05-02 NOTE — Progress Notes (Signed)
I connected with Cynthia May today by telephone and verified that I am speaking with the correct person using two identifiers. Location patient: home Location provider: work Persons participating in the virtual visit: patient, Marine scientist.    I discussed the limitations, risks, security and privacy concerns of performing an evaluation and management service by telephone and the availability of in person appointments. I also discussed with the patient that there may be a patient responsible charge related to this service. The patient expressed understanding and verbally consented to this telephonic visit.    Interactive audio and video telecommunications were attempted between this provider and patient, however failed, due to patient having technical difficulties OR patient did not have access to video capability.  We continued and completed visit with audio only.  Some vital signs may be absent or patient reported.    Subjective:   Cynthia May is a 69 y.o. female who presents for Medicare Annual (Subsequent) preventive examination.  Review of Systems    Cardiac Risk Factors include: advanced age (>30men, >81 women);dyslipidemia;hypertension     Objective:     Advanced Directives 05/03/2020 01/29/2019 01/27/2018 01/24/2017 11/10/2015 10/15/2014  Does Patient Have a Medical Advance Directive? Yes Yes No No No No  Type of Paramedic of [place];Living will [place];Living will - - - -  Does patient want to make changes to medical advance directive? No - Patient declined No - Patient declined - - Yes - information given -  Copy of [place] in Chart? Yes - validated most recent copy scanned in chart (See row information) No - copy requested - - No - copy requested -  Would patient like information on creating a medical advance directive? - - No - Patient declined No - Patient declined Yes - Educational materials given No - patient  declined information    Current Medications (verified) Outpatient Encounter Medications as of 05/03/2020  Medication Sig  . atorvastatin (LIPITOR) 10 MG tablet Take 1 tablet (10 mg total) by mouth daily.  . DULoxetine (CYMBALTA) 60 MG capsule Take 1 capsule by mouth daily.  Marland Kitchen levothyroxine (SYNTHROID, LEVOTHROID) 112 MCG tablet Take 1 tablet by mouth daily.  Marland Kitchen lisinopril (ZESTRIL) 10 MG tablet Take 1 tablet (10 mg total) by mouth daily.  . traZODone (DESYREL) 50 MG tablet Take 1 nightly for sleep   No facility-administered encounter medications on file as of 05/03/2020.    Allergies (verified) Augmentin [amoxicillin-pot clavulanate], Erythromycin, and Other   History: Past Medical History:  Diagnosis Date  . Asthma   . Depression   . Environmental allergies   . HTN (hypertension)   . Memory loss   . Pancreatitis   . Retinal vessel occlusion   . Thyroid disease    Past Surgical History:  Procedure Laterality Date  . CHOLECYSTECTOMY    . EYE SURGERY Bilateral    Cataract sx. Dr.Beavis.  . TONSILLECTOMY AND ADENOIDECTOMY     Family History  Problem Relation Age of Onset  . Arthritis Mother   . Heart disease Mother   . Stroke Mother   . Thyroid disease Mother   . Alzheimer's disease Mother   . Heart disease Father   . Colon cancer Brother   . Depression Sister   . Alzheimer's disease Sister   . Lung cancer Paternal Grandmother   . Prostate cancer Brother   . Heart disease Brother   . Depression Sister        5 silbling had  depression from Thyroid disease   . Arthritis Sister   . Lung cancer Brother   . Other Sister        Creutzfeldt-Jacob Disease  . Heart attack Brother    Social History   Socioeconomic History  . Marital status: Widowed    Spouse name: Not on file  . Number of children: 3  . Years of education: Masters  . Highest education level: Not on file  Occupational History  . Occupation: Retired  Tobacco Use  . Smoking status: Former Research scientist (life sciences)  .  Smokeless tobacco: Never Used  . Tobacco comment: Quit 1990  Substance and Sexual Activity  . Alcohol use: No    Alcohol/week: 0.0 standard drinks  . Drug use: No  . Sexual activity: Never  Other Topics Concern  . Not on file  Social History Narrative   Exercise--  Pt is active during day-- no organized exercise.   Lives at home alone.   Right-handed.   1-2 diet sodas per day.   Social Determinants of Health   Financial Resource Strain: Low Risk   . Difficulty of Paying Living Expenses: Not hard at all  Food Insecurity: No Food Insecurity  . Worried About Charity fundraiser in the Last Year: Never true  . Ran Out of Food in the Last Year: Never true  Transportation Needs: No Transportation Needs  . Lack of Transportation (Medical): No  . Lack of Transportation (Non-Medical): No  Physical Activity:   . Days of Exercise per Week:   . Minutes of Exercise per Session:   Stress:   . Feeling of Stress :   Social Connections:   . Frequency of Communication with Friends and Family:   . Frequency of Social Gatherings with Friends and Family:   . Attends Religious Services:   . Active Member of Clubs or Organizations:   . Attends Archivist Meetings:   Marland Kitchen Marital Status:     Tobacco Counseling Counseling given: Not Answered Comment: Quit 1990   Clinical Intake: Pain : No/denies pain   Activities of Daily Living In your present state of health, do you have any difficulty performing the following activities: 05/03/2020  Hearing? N  Vision? N  Difficulty concentrating or making decisions? N  Walking or climbing stairs? N  Dressing or bathing? N  Doing errands, shopping? N  Preparing Food and eating ? N  Using the Toilet? N  In the past six months, have you accidently leaked urine? N  Do you have problems with loss of bowel control? N  Managing your Medications? N  Managing your Finances? N  Housekeeping or managing your Housekeeping? N  Some recent data might  be hidden    Patient Care Team: Carollee Herter, Alferd Apa, DO as PCP - General (Family Medicine) Lendon Colonel, MD as Referring Physician (Psychiatry) Amalia Greenhouse, MD as Referring Physician (Endocrinology) Desma Maxim, MD as Consulting Physician (Ophthalmology)  Indicate any recent Medical Services you may have received from other than [facility] providers in the past year (date may be approximate).     Assessment:   This is a routine wellness examination for [place].  Dietary issues and exercise activities discussed: Current Exercise Habits: The patient does not participate in regular exercise at present, Exercise limited by: None identified Diet (meal preparation, eat out, water intake, caffeinated beverages, dairy products, fruits and vegetables): well balanced      Goals    . DIET - INCREASE WATER INTAKE    .  Increase physical activity      Depression Screen PHQ 2/9 Scores 05/03/2020 01/29/2019 01/27/2018 01/24/2017 11/10/2015 11/10/2015 01/05/2015  PHQ - 2 Score 0 0 1 0 0 0 0    Fall Risk Fall Risk  05/03/2020 01/29/2019 01/27/2018 01/24/2017 11/10/2015  Falls in the past year? 0 0 No No No  Number falls in past yr: 0 - - - -  Injury with Fall? 0 - - - -  Follow up Education provided;Falls prevention discussed - - - -   Lives on 1st floor of [address]. Any stairs in or around the home? Yes  If so, are there any without handrails? No  Home free of loose throw rugs in walkways, pet beds, electrical cords, etc? Yes  Adequate lighting in your home to reduce risk of falls? Yes   ASSISTIVE DEVICES UTILIZED TO PREVENT FALLS:  Life alert? No  Use of a cane, walker or w/c? No  Grab bars in the bathroom? No  Shower chair or bench in shower? No  Elevated toilet seat or a handicapped toilet? No    Cognitive Function: Ad8 score reviewed for issues:  Issues making decisions:no  Less interest in hobbies / activities:no  Repeats questions, stories (family complaining):no  Trouble  using ordinary gadgets (microwave, computer, phone):no  Forgets the month or year: no  Mismanaging finances: no  Remembering appts:no  Daily problems with thinking and/or memory:no Ad8 score is=0    MMSE - Mini Mental State Exam 01/24/2017 08/28/2016 02/28/2016 11/10/2015  Orientation to time 5 5 5 5   Orientation to Place 5 5 5 5   Registration 3 3 3 3   Attention/ Calculation 5 5 5 5   Recall 3 3 3 3   Language- name 2 objects 2 2 2 2   Language- repeat 1 1 1 1   Language- follow 3 step command 3 3 3 3   Language- read & follow direction 1 1 1 1   Write a sentence 1 1 1 1   Copy design 1 1 1 1   Total score 30 30 30 30         Immunizations Immunization History  Administered Date(s) Administered  . Influenza, High Dose Seasonal PF 07/04/2016, 06/29/2018  . Influenza-Unspecified 07/01/2015, 07/02/2017, 06/07/2019  . PFIZER SARS-COV-2 Vaccination 11/20/2019, 12/14/2019  . Pneumococcal Conjugate-13 01/19/2015  . Pneumococcal Polysaccharide-23 01/24/2016  . Tdap 01/19/2015  . Zoster 07/11/2010  . Zoster Recombinat (Shingrix) 03/15/2017, 05/31/2017    TDAP status: Up to date Flu Vaccine status: Up to date Pneumococcal vaccine status: Up to date Covid-19 vaccine status: Completed vaccines  Qualifies for Shingles Vaccine? Yes   Zostavax completed Yes   Shingrix Completed?: Yes  Screening Tests Health Maintenance  Topic Date Due  . INFLUENZA VACCINE  04/30/2020  . MAMMOGRAM  03/09/2021  . COLONOSCOPY  07/16/2021  . TETANUS/TDAP  01/18/2025  . DEXA SCAN  Completed  . COVID-19 Vaccine  Completed  . Hepatitis C Screening  Completed  . PNA vac Low Risk Adult  Completed    Health Maintenance  Health Maintenance Due  Topic Date Due  . INFLUENZA VACCINE  04/30/2020    Pt will discuss colon cancer screening at next ov. Mammogram status: Completed 02/2020 per pt.. Repeat every year Bone Density status: Ordered today.Marland Kitchen Pt provided with contact info and advised to call to  schedule appt.  Lung Cancer Screening: (Low Dose CT Chest recommended if Age 53-80 years, 30 pack-year currently smoking OR have quit w/in 15years.) does not qualify.    Additional Screening:  Hepatitis C Screening: does qualify; Completed 11/10/15  Vision Screening: Recommended annual ophthalmology exams for early detection of glaucoma and other disorders of the eye. Is the patient up to date with their annual eye exam?  Yes   Dental Screening: Recommended annual dental exams for proper oral hygiene  Community Resource Referral / Chronic Care Management: CRR required this visit?  No   CCM required this visit?  No      Plan:    Please schedule your next medicare wellness visit with me in 1 yr.  Continue to eat heart healthy diet (full of fruits, vegetables, whole grains, lean protein, water--limit salt, fat, and sugar intake) and increase physical activity as tolerated.  Continue doing brain stimulating activities (puzzles, reading, adult coloring books, staying active) to keep memory sharp.   I have ordered your bone density scan. Please schedule.   I have personally reviewed and noted the following in the patient's chart:   . Medical and social history . Use of alcohol, tobacco or illicit drugs  . Current medications and supplements . Functional ability and status . Nutritional status . Physical activity . Advanced directives . List of other physicians . Hospitalizations, surgeries, and ER visits in previous 12 months . Vitals . Screenings to include cognitive, depression, and falls . Referrals and appointments  In addition, I have reviewed and discussed with patient certain preventive protocols, quality metrics, and best practice recommendations. A written personalized care plan for preventive services as well as general preventive health recommendations were provided to patient.   Due to this being a telephonic visit, the after visit summary with patients  personalized plan was offered to patient via mail or my-chart. Patient declined at this time.  Shela Nevin, [place]   05/03/2020

## 2020-05-03 ENCOUNTER — Ambulatory Visit (INDEPENDENT_AMBULATORY_CARE_PROVIDER_SITE_OTHER): Payer: Medicare PPO | Admitting: *Deleted

## 2020-05-03 ENCOUNTER — Encounter: Payer: Self-pay | Admitting: *Deleted

## 2020-05-03 ENCOUNTER — Other Ambulatory Visit: Payer: Self-pay

## 2020-05-03 DIAGNOSIS — Z780 Asymptomatic menopausal state: Secondary | ICD-10-CM | POA: Diagnosis not present

## 2020-05-03 DIAGNOSIS — Z0000 Encounter for general adult medical examination without abnormal findings: Secondary | ICD-10-CM | POA: Diagnosis not present

## 2020-05-03 NOTE — Patient Instructions (Signed)
Please schedule your next medicare wellness visit with me in 1 yr.  Continue to eat heart healthy diet (full of fruits, vegetables, whole grains, lean protein, water--limit salt, fat, and sugar intake) and increase physical activity as tolerated.  Continue doing brain stimulating activities (puzzles, reading, adult coloring books, staying active) to keep memory sharp.   I have ordered your bone density scan. Please schedule.    Cynthia May , Thank you for taking time to come for your Medicare Wellness Visit. I appreciate your ongoing commitment to your health goals. Please review the following plan we discussed and let me know if I can assist you in the future.   These are the goals we discussed: Goals    . DIET - INCREASE WATER INTAKE    . Increase physical activity       This is a list of the screening recommended for you and due dates:  Health Maintenance  Topic Date Due  . Flu Shot  04/30/2020  . Mammogram  03/09/2021  . Colon Cancer Screening  07/16/2021  . Tetanus Vaccine  01/18/2025  . DEXA scan (bone density measurement)  Completed  . COVID-19 Vaccine  Completed  .  Hepatitis C: One time screening is recommended by Center for Disease Control  (CDC) for  adults born from [birth year] through 1967.   Completed  . Pneumonia vaccines  Completed    Preventive Care 52 Years and Older, Female Preventive care refers to lifestyle choices and visits with your health care provider that can promote health and wellness. This includes:  A yearly physical exam. This is also called an annual well check.  Regular dental and eye exams.  Immunizations.  Screening for certain conditions.  Healthy lifestyle choices, such as diet and exercise. What can I expect for my preventive care visit? Physical exam Your health care provider will check:  Height and weight. These may be used to calculate body mass index (BMI), which is a measurement that tells if you are at a healthy weight.  Heart  rate and blood pressure.  Your skin for abnormal spots. Counseling Your health care provider may ask you questions about:  Alcohol, tobacco, and drug use.  Emotional well-being.  Home and relationship well-being.  Sexual activity.  Eating habits.  History of falls.  Memory and ability to understand (cognition).  Work and work Statistician.  Pregnancy and menstrual history. What immunizations do I need?  Influenza (flu) vaccine  This is recommended every year. Tetanus, diphtheria, and pertussis (Tdap) vaccine  You may need a Td booster every 10 years. Varicella (chickenpox) vaccine  You may need this vaccine if you have not already been vaccinated. Zoster (shingles) vaccine  You may need this after age 36. Pneumococcal conjugate (PCV13) vaccine  One dose is recommended after age 19. Pneumococcal polysaccharide (PPSV23) vaccine  One dose is recommended after age 68. Measles, mumps, and rubella (MMR) vaccine  You may need at least one dose of MMR if you were born in 1957 or later. You may also need a second dose. Meningococcal conjugate (MenACWY) vaccine  You may need this if you have certain conditions. Hepatitis A vaccine  You may need this if you have certain conditions or if you travel or work in places where you may be exposed to hepatitis A. Hepatitis B vaccine  You may need this if you have certain conditions or if you travel or work in places where you may be exposed to hepatitis B. Haemophilus influenzae type b (  Hib) vaccine  You may need this if you have certain conditions. You may receive vaccines as individual doses or as more than one vaccine together in one shot (combination vaccines). Talk with your health care provider about the risks and benefits of combination vaccines. What tests do I need? Blood tests  Lipid and cholesterol levels. These may be checked every 5 years, or more frequently depending on your overall health.  Hepatitis C  test.  Hepatitis B test. Screening  Lung cancer screening. You may have this screening every year starting at age 57 if you have a 30-pack-year history of smoking and currently smoke or have quit within the past 15 years.  Colorectal cancer screening. All adults should have this screening starting at age 87 and continuing until age 70. Your health care provider may recommend screening at age 49 if you are at increased risk. You will have tests every 1-10 years, depending on your results and the type of screening test.  Diabetes screening. This is done by checking your blood sugar (glucose) after you have not eaten for a while (fasting). You may have this done every 1-3 years.  Mammogram. This may be done every 1-2 years. Talk with your health care provider about how often you should have regular mammograms.  BRCA-related cancer screening. This may be done if you have a family history of breast, ovarian, tubal, or peritoneal cancers. Other tests  Sexually transmitted disease (STD) testing.  Bone density scan. This is done to screen for osteoporosis. You may have this done starting at age 24. Follow these instructions at home: Eating and drinking  Eat a diet that includes fresh fruits and vegetables, whole grains, lean protein, and low-fat dairy products. Limit your intake of foods with high amounts of sugar, saturated fats, and salt.  Take vitamin and mineral supplements as recommended by your health care provider.  Do not drink alcohol if your health care provider tells you not to drink.  If you drink alcohol: ? Limit how much you have to 0-1 drink a day. ? Be aware of how much alcohol is in your drink. In the [place] one drink equals one 12 oz bottle of beer (355 mL), one 5 oz glass of wine (148 mL), or one 1 oz glass of hard liquor (44 mL). Lifestyle  Take daily care of your teeth and gums.  Stay active. Exercise for at least 30 minutes on 5 or more days each week.  Do not use  any products that contain nicotine or tobacco, such as cigarettes, e-cigarettes, and chewing tobacco. If you need help quitting, ask your health care provider.  If you are sexually active, practice safe sex. Use a condom or other form of protection in order to prevent STIs (sexually transmitted infections).  Talk with your health care provider about taking a low-dose aspirin or statin. What's next?  Go to your health care provider once a year for a well check visit.  Ask your health care provider how often you should have your eyes and teeth checked.  Stay up to date on all vaccines. This information is not intended to replace advice given to you by your health care provider. Make sure you discuss any questions you have with your health care provider. Document Revised: 09/10/2018 Document Reviewed: 09/10/2018 Elsevier Patient Education  2020 [employer].

## 2020-05-12 ENCOUNTER — Ambulatory Visit (HOSPITAL_BASED_OUTPATIENT_CLINIC_OR_DEPARTMENT_OTHER)
Admission: RE | Admit: 2020-05-12 | Discharge: 2020-05-12 | Disposition: A | Discharge status: Home / Self Care | Payer: Medicare PPO | Source: Ambulatory Visit | Attending: Internal Medicine | Admitting: Internal Medicine

## 2020-05-12 ENCOUNTER — Other Ambulatory Visit: Payer: Self-pay

## 2020-05-12 DIAGNOSIS — M8588 Other specified disorders of bone density and structure, other site: Secondary | ICD-10-CM | POA: Diagnosis not present

## 2020-05-12 DIAGNOSIS — Z780 Asymptomatic menopausal state: Secondary | ICD-10-CM | POA: Diagnosis not present

## 2020-05-15 ENCOUNTER — Encounter: Payer: Self-pay | Admitting: Family Medicine

## 2020-05-15 DIAGNOSIS — M810 Age-related osteoporosis without current pathological fracture: Secondary | ICD-10-CM | POA: Insufficient documentation

## 2020-05-19 ENCOUNTER — Other Ambulatory Visit: Payer: Self-pay

## 2020-05-19 MED ORDER — ALENDRONATE SODIUM 70 MG PO TABS: 70.0000 mg | ORAL_TABLET | ORAL | 1 refills | Status: DC

## 2020-05-31 DIAGNOSIS — H34832 Tributary (branch) retinal vein occlusion, left eye, with macular edema: Secondary | ICD-10-CM | POA: Diagnosis not present

## 2020-06-02 DIAGNOSIS — Z20822 Contact with and (suspected) exposure to covid-19: Secondary | ICD-10-CM | POA: Diagnosis not present

## 2020-06-30 ENCOUNTER — Other Ambulatory Visit: Payer: Self-pay | Admitting: Family Medicine

## 2020-06-30 DIAGNOSIS — I10 Essential (primary) hypertension: Secondary | ICD-10-CM

## 2020-06-30 DIAGNOSIS — E039 Hypothyroidism, unspecified: Secondary | ICD-10-CM | POA: Diagnosis not present

## 2020-06-30 DIAGNOSIS — E785 Hyperlipidemia, unspecified: Secondary | ICD-10-CM

## 2020-07-05 ENCOUNTER — Other Ambulatory Visit: Payer: Self-pay | Admitting: Family Medicine

## 2020-07-05 DIAGNOSIS — E785 Hyperlipidemia, unspecified: Secondary | ICD-10-CM

## 2020-07-14 ENCOUNTER — Encounter: Payer: Self-pay | Admitting: Family Medicine

## 2020-07-14 ENCOUNTER — Ambulatory Visit: Payer: Medicare PPO | Admitting: Family Medicine

## 2020-07-14 ENCOUNTER — Other Ambulatory Visit: Payer: Self-pay

## 2020-07-14 VITALS — BP 120/80 | HR 90 | Temp 98.4°F | Resp 18 | Ht 63.0 in | Wt 157.6 lb

## 2020-07-14 DIAGNOSIS — E785 Hyperlipidemia, unspecified: Secondary | ICD-10-CM

## 2020-07-14 DIAGNOSIS — E039 Hypothyroidism, unspecified: Secondary | ICD-10-CM

## 2020-07-14 DIAGNOSIS — I10 Essential (primary) hypertension: Secondary | ICD-10-CM

## 2020-07-14 MED ORDER — ATORVASTATIN CALCIUM 10 MG PO TABS: 10.0000 mg | ORAL_TABLET | Freq: Every day | ORAL | 1 refills | Status: DC

## 2020-07-14 NOTE — Assessment & Plan Note (Signed)
Well controlled, no changes to meds. Encouraged heart healthy diet such as the DASH diet and exercise as tolerated.  con't lisinopril 

## 2020-07-14 NOTE — Assessment & Plan Note (Signed)
Tolerating statin, encouraged heart healthy diet, avoid trans fats, minimize simple carbs and saturated fats. Increase exercise as tolerated con't lipitor  

## 2020-07-14 NOTE — Progress Notes (Signed)
Patient ID: Cynthia May, female    DOB: [birth year]  Age: 69 y.o. MRN: [id number]    Subjective:  Subjective  HPI Cynthia May presents for f/u bp and cholesterol.   No complaints  Review of Systems  Constitutional: Negative for appetite change, diaphoresis, fatigue and unexpected weight change.  Eyes: Negative for pain, redness and visual disturbance.  Respiratory: Negative for cough, chest tightness, shortness of breath and wheezing.   Cardiovascular: Negative for chest pain, palpitations and leg swelling.  Endocrine: Negative for cold intolerance, heat intolerance, polydipsia, polyphagia and polyuria.  Genitourinary: Negative for difficulty urinating, dysuria and frequency.  Neurological: Negative for dizziness, light-headedness, numbness and headaches.    History Past Medical History:  Diagnosis Date  . Asthma   . Depression   . Environmental allergies   . HTN (hypertension)   . Memory loss   . Pancreatitis   . Retinal vessel occlusion   . Thyroid disease     She has a past surgical history that includes Cholecystectomy; Tonsillectomy and adenoidectomy; and Eye surgery (Bilateral).   Her family history includes Alzheimer's disease in her mother and sister; Arthritis in her mother and sister; Colon cancer in her brother; Depression in her sister and sister; Heart attack in her brother; Heart disease in her brother, father, and mother; Lung cancer in her brother and paternal grandmother; Other in her sister; Prostate cancer in her brother; Stroke in her mother; Thyroid disease in her mother.She reports that she has quit smoking. She has never used smokeless tobacco. She reports that she does not drink alcohol and does not use drugs.  Current Outpatient Medications on File Prior to Visit  Medication Sig Dispense Refill  . alendronate (FOSAMAX) 70 MG tablet Take 1 tablet (70 mg total) by mouth every 7 (seven) days. Take with a full glass of water on an empty stomach.  12 tablet 1  . DULoxetine (CYMBALTA) 60 MG capsule Take 1 capsule by mouth daily.  5  . levothyroxine (SYNTHROID) 137 MCG tablet Take by mouth.    Marland Kitchen lisinopril (ZESTRIL) 10 MG tablet Take 1 tablet (10 mg total) by mouth daily. 30 tablet 0  . traZODone (DESYREL) 50 MG tablet Take 1 nightly for sleep     No current facility-administered medications on file prior to visit.     Objective:  Objective  Physical Exam Vitals and nursing note reviewed.  Constitutional:      Appearance: She is well-developed.  HENT:     Head: Normocephalic and atraumatic.  Eyes:     Conjunctiva/sclera: Conjunctivae normal.  Neck:     Thyroid: No thyromegaly.     Vascular: No carotid bruit or JVD.  Cardiovascular:     Rate and Rhythm: Normal rate and regular rhythm.     Heart sounds: Normal heart sounds. No murmur heard.   Pulmonary:     Effort: Pulmonary effort is normal. No respiratory distress.     Breath sounds: Normal breath sounds. No wheezing or rales.  Chest:     Chest wall: No tenderness.  Musculoskeletal:     Cervical back: Normal range of motion and neck supple.  Neurological:     Mental Status: She is alert and oriented to person, place, and time.    BP 120/80   Pulse 90   Temp 98.4 F (36.9 C) (Oral)   Resp 18   Ht 5\' 3"  (1.6 m)   Wt 157 lb 9.6 oz (71.5 kg)   SpO2 97%   BMI 27.92  kg/m  Wt Readings from Last 3 Encounters:  07/14/20 157 lb 9.6 oz (71.5 kg)  02/18/20 151 lb 9.6 oz (68.8 kg)  12/13/19 152 lb (68.9 kg)     Lab Results  Component Value Date   WBC 5.9 03/12/2018   HGB 15.1 (H) 03/12/2018   HCT 45.2 03/12/2018   PLT 209.0 03/12/2018   GLUCOSE 108 (H) 12/13/2019   CHOL 137 12/13/2019   TRIG 126.0 12/13/2019   HDL 51.50 12/13/2019   LDLCALC 60 12/13/2019   ALT 10 12/13/2019   AST 16 12/13/2019   NA 142 12/13/2019   K 4.4 12/13/2019   CL 108 12/13/2019   CREATININE 0.78 12/13/2019   BUN 18 12/13/2019   CO2 29 12/13/2019   TSH 4.81 (H) 03/12/2018    HGBA1C 5.6 03/12/2018    DG Bone Density  Result Date: 05/12/2020 EXAM: DUAL X-RAY ABSORPTIOMETRY (DXA) FOR BONE MINERAL DENSITY IMPRESSION: JOSE E PAZ Your patient [name] completed a BMD test on 05/12/2020 using the [place] (analysis version: 16.SP2) manufactured by [employer]. The following summarizes the results of our evaluation. [facility] PATIENT: Name: Cynthia May Patient ID: [id number] Birth Date: Jan 01, 1951 Height: 63.0 in. Gender: Female Measured: 05/12/2020 Weight: 153.8 lbs. Indications: Advanced Age, Caucasian, Estrogen Deficiency, History of Osteopenia, Hypothyroidism, Parent hip Fx, Post Menopausal, Previous Tobacco User Fractures: Wrist Treatments: Calcium, HRT, Levothyroxine, Multivitamin ASSESSMENT: The BMD measured at AP Spine L1-L3 is 0.840 g/cm2 with a T-score of -2.7. This patient is considered osteoporotic according to [facility]) criteria. L-4 was excluded due to degenerative changes. Compared with the prior study on, 02/02/2018 the BMD of the total mean shows a statistically significant decrease in the spine. The scan quality is good. Site Region Measured Date Measured Age WHO YA BMD Classification T-score AP Spine L1-L3 05/12/2020 70.7 Osteoporosis -2.7 0.840 g/cm2 AP Spine L1-L3 02/02/2018 68.4 Osteopenia -1.9 0.946 g/cm2 [employer] Total Mean 05/12/2020 70.7 Osteopenia -1.1 0.872 g/cm2 [employer] Total Mean 02/02/2018 68.4 Normal -1.0 0.886 g/cm2 World Health Organization [facility]) criteria for post-menopausal, Caucasian Women: Normal       T-score at or above -1 SD Osteopenia   T-score between -1 and -2.5 SD Osteoporosis T-score at or below -2.5 SD RECOMMENDATION:1. All patients should optimize calcium and vitamin D intake. 2. Consider FDA-approved medical therapies in postmenopausal women and men aged 71 years and older, based on the following: a. A hip or vertebral(clinical or morphometric) fracture. b. T-Score < -2.5 at the femoral  neck or spine after appropriate evaluation to exclude secondary causes c. Low bone mass (T-score between -1.0 and -2.5 at the femoral neck or spine) and a 10 year probability of a hip fracture >3% or a 10 year probability of major osteoporosis-related fracture > 20% based on the [place]-adapted WHO algorithm d. Clinical judgement and/or patient preferences may indicate treatment for people with 10-year fracture probabilities above or below these levels FOLLOW-UP: Patients with diagnosis of osteoporosis or at high risk for fracture should have regular bone mineral density tests. For patients eligible for Medicare, routine testing is allowed once every 2 years. The testing frequency can be increased to one year for patients who have rapidly progressing disease, those who are receiving or discontinuing medical therapy to restore bone mass, or have additional risk factors. I have reviewed this report, anf agree with the above findings. [facility] Electronically Signed   By: Lowella Grip III M.D.   On: 05/12/2020 09:58     Assessment &  Plan:  Plan  I have changed Leda Gauze Mccormack's atorvastatin. I am also having her maintain her DULoxetine, traZODone, alendronate, lisinopril, and levothyroxine.  Meds ordered this encounter  Medications  . atorvastatin (LIPITOR) 10 MG tablet    Sig: Take 1 tablet (10 mg total) by mouth daily.    Dispense:  90 tablet    Refill:  1    Problem List Items Addressed This Visit      Unprioritized   Essential hypertension    Well controlled, no changes to meds. Encouraged heart healthy diet such as the DASH diet and exercise as tolerated.  con't lisinopril       Relevant Medications   atorvastatin (LIPITOR) 10 MG tablet   Hyperlipidemia    Tolerating statin, encouraged heart healthy diet, avoid trans fats, minimize simple carbs and saturated fats. Increase exercise as tolerated  con't lipitor      Relevant Medications   atorvastatin (LIPITOR) 10 MG  tablet   Other Relevant Orders   Lipid panel   Comprehensive metabolic panel   Hypothyroidism    Per endo       Relevant Medications   levothyroxine (SYNTHROID) 137 MCG tablet    Other Visit Diagnoses    Primary hypertension    -  Primary   Relevant Medications   atorvastatin (LIPITOR) 10 MG tablet   Other Relevant Orders   Lipid panel   Comprehensive metabolic panel      Follow-up: Return in about 6 months (around 01/12/2021), or if symptoms worsen or fail to improve, for annual exam, fasting.  [name], DO

## 2020-07-14 NOTE — Assessment & Plan Note (Signed)
Per endo °

## 2020-07-14 NOTE — Assessment & Plan Note (Signed)
con'trolled con't psych

## 2020-07-14 NOTE — Patient Instructions (Signed)
DASH Eating Plan DASH stands for "Dietary Approaches to Stop Hypertension." The DASH eating plan is a healthy eating plan that has been shown to reduce high blood pressure (hypertension). It may also reduce your risk for type 2 diabetes, heart disease, and stroke. The DASH eating plan may also help with weight loss. What are tips for following this plan?  General guidelines  Avoid eating more than 2,300 mg (milligrams) of salt (sodium) a day. If you have hypertension, you may need to reduce your sodium intake to 1,500 mg a day.  Limit alcohol intake to no more than 1 drink a day for nonpregnant women and 2 drinks a day for men. One drink equals 12 oz of beer, 5 oz of wine, or 1 oz of hard liquor.  Work with your health care provider to maintain a healthy body weight or to lose weight. Ask what an ideal weight is for you.  Get at least 30 minutes of exercise that causes your heart to beat faster (aerobic exercise) most days of the week. Activities may include walking, swimming, or biking.  Work with your health care provider or diet and nutrition specialist (dietitian) to adjust your eating plan to your individual calorie needs. Reading food labels   Check food labels for the amount of sodium per serving. Choose foods with less than 5 percent of the Daily Value of sodium. Generally, foods with less than 300 mg of sodium per serving fit into this eating plan.  To find whole grains, look for the word "whole" as the first word in the ingredient list. Shopping  Buy products labeled as "low-sodium" or "no salt added."  Buy fresh foods. Avoid canned foods and premade or frozen meals. Cooking  Avoid adding salt when cooking. Use salt-free seasonings or herbs instead of table salt or sea salt. Check with your health care provider or pharmacist before using salt substitutes.  Do not fry foods. Cook foods using healthy methods such as baking, boiling, grilling, and broiling instead.  Cook with  heart-healthy oils, such as olive, canola, soybean, or sunflower oil. Meal planning  Eat a balanced diet that includes: ? 5 or more servings of fruits and vegetables each day. At each meal, try to fill half of your plate with fruits and vegetables. ? Up to 6-8 servings of whole grains each day. ? Less than 6 oz of lean meat, poultry, or fish each day. A 3-oz serving of meat is about the same size as a deck of cards. One egg equals 1 oz. ? 2 servings of low-fat dairy each day. ? A serving of nuts, seeds, or beans 5 times each week. ? Heart-healthy fats. Healthy fats called Omega-3 fatty acids are found in foods such as flaxseeds and coldwater fish, like sardines, salmon, and mackerel.  Limit how much you eat of the following: ? Canned or prepackaged foods. ? Food that is high in trans fat, such as fried foods. ? Food that is high in saturated fat, such as fatty meat. ? Sweets, desserts, sugary drinks, and other foods with added sugar. ? Full-fat dairy products.  Do not salt foods before eating.  Try to eat at least 2 vegetarian meals each week.  Eat more home-cooked food and less restaurant, buffet, and fast food.  When eating at a restaurant, ask that your food be prepared with less salt or no salt, if possible. What foods are recommended? The items listed may not be a complete list. Talk with your dietitian about   what dietary choices are best for you. Grains Whole-grain or whole-wheat bread. Whole-grain or whole-wheat pasta. Brown rice. Oatmeal. Quinoa. Bulgur. Whole-grain and low-sodium cereals. Pita bread. Low-fat, low-sodium crackers. Whole-wheat flour tortillas. Vegetables Fresh or frozen vegetables (raw, steamed, roasted, or grilled). Low-sodium or reduced-sodium tomato and vegetable juice. Low-sodium or reduced-sodium tomato sauce and tomato paste. Low-sodium or reduced-sodium canned vegetables. Fruits All fresh, dried, or frozen fruit. Canned fruit in natural juice (without  added sugar). Meat and other protein foods Skinless chicken or turkey. Ground chicken or turkey. Pork with fat trimmed off. Fish and seafood. Egg whites. Dried beans, peas, or lentils. Unsalted nuts, nut butters, and seeds. Unsalted canned beans. Lean cuts of beef with fat trimmed off. Low-sodium, lean deli meat. Dairy Low-fat (1%) or fat-free (skim) milk. Fat-free, low-fat, or reduced-fat cheeses. Nonfat, low-sodium ricotta or cottage cheese. Low-fat or nonfat yogurt. Low-fat, low-sodium cheese. Fats and oils Soft margarine without trans fats. Vegetable oil. Low-fat, reduced-fat, or light mayonnaise and salad dressings (reduced-sodium). Canola, safflower, olive, soybean, and sunflower oils. Avocado. Seasoning and other foods Herbs. Spices. Seasoning mixes without salt. Unsalted popcorn and pretzels. Fat-free sweets. What foods are not recommended? The items listed may not be a complete list. Talk with your dietitian about what dietary choices are best for you. Grains Baked goods made with fat, such as croissants, muffins, or some breads. Dry pasta or rice meal packs. Vegetables Creamed or fried vegetables. Vegetables in a cheese sauce. Regular canned vegetables (not low-sodium or reduced-sodium). Regular canned tomato sauce and paste (not low-sodium or reduced-sodium). Regular tomato and vegetable juice (not low-sodium or reduced-sodium). Pickles. Olives. Fruits Canned fruit in a light or heavy syrup. Fried fruit. Fruit in cream or butter sauce. Meat and other protein foods Fatty cuts of meat. Ribs. Fried meat. Bacon. Sausage. Bologna and other processed lunch meats. Salami. Fatback. Hotdogs. Bratwurst. Salted nuts and seeds. Canned beans with added salt. Canned or smoked fish. Whole eggs or egg yolks. Chicken or turkey with skin. Dairy Whole or 2% milk, cream, and half-and-half. Whole or full-fat cream cheese. Whole-fat or sweetened yogurt. Full-fat cheese. Nondairy creamers. Whipped toppings.  Processed cheese and cheese spreads. Fats and oils Butter. Stick margarine. Lard. Shortening. Ghee. Bacon fat. Tropical oils, such as coconut, palm kernel, or palm oil. Seasoning and other foods Salted popcorn and pretzels. Onion salt, garlic salt, seasoned salt, table salt, and sea salt. Worcestershire sauce. Tartar sauce. Barbecue sauce. Teriyaki sauce. Soy sauce, including reduced-sodium. Steak sauce. Canned and packaged gravies. Fish sauce. Oyster sauce. Cocktail sauce. Horseradish that you find on the shelf. Ketchup. Mustard. Meat flavorings and tenderizers. Bouillon cubes. Hot sauce and Tabasco sauce. Premade or packaged marinades. Premade or packaged taco seasonings. Relishes. Regular salad dressings. Where to find more information:  National Heart, Lung, and Blood Institute: www.nhlbi.nih.gov  American Heart Association: www.heart.org Summary  The DASH eating plan is a healthy eating plan that has been shown to reduce high blood pressure (hypertension). It may also reduce your risk for type 2 diabetes, heart disease, and stroke.  With the DASH eating plan, you should limit salt (sodium) intake to 2,300 mg a day. If you have hypertension, you may need to reduce your sodium intake to 1,500 mg a day.  When on the DASH eating plan, aim to eat more fresh fruits and vegetables, whole grains, lean proteins, low-fat dairy, and heart-healthy fats.  Work with your health care provider or diet and nutrition specialist (dietitian) to adjust your eating plan to your   individual calorie needs. This information is not intended to replace advice given to you by your health care provider. Make sure you discuss any questions you have with your health care provider. Document Revised: 08/29/2017 Document Reviewed: 09/09/2016 Elsevier Patient Education  2020 Elsevier Inc.  

## 2020-07-15 LAB — COMPREHENSIVE METABOLIC PANEL
AG Ratio: 1.8 (calc) (ref 1.0–2.5)
ALT: 14 U/L (ref 6–29)
AST: 19 U/L (ref 10–35)
Albumin: 4 g/dL (ref 3.6–5.1)
Alkaline phosphatase (APISO): 74 U/L (ref 37–153)
BUN: 18 mg/dL (ref 7–25)
CO2: 28 mmol/L (ref 20–32)
Calcium: 9.3 mg/dL (ref 8.6–10.4)
Chloride: 107 mmol/L (ref 98–110)
Creat: 0.93 mg/dL (ref 0.60–0.93)
Globulin: 2.2 g/dL (calc) (ref 1.9–3.7)
Glucose, Bld: 57 mg/dL — ABNORMAL LOW (ref 65–99)
Potassium: 4.1 mmol/L (ref 3.5–5.3)
Sodium: 143 mmol/L (ref 135–146)
Total Bilirubin: 1 mg/dL (ref 0.2–1.2)
Total Protein: 6.2 g/dL (ref 6.1–8.1)

## 2020-07-15 LAB — LIPID PANEL
Cholesterol: 131 mg/dL (ref <200)
HDL: 60 mg/dL (ref >=50)
LDL Cholesterol (Calc): 54 mg/dL (calc)
Non-HDL Cholesterol (Calc): 71 mg/dL (calc) (ref <130)
Total CHOL/HDL Ratio: 2.2 (calc) (ref <5.0)
Triglycerides: 83 mg/dL (ref <150)

## 2020-07-17 MED ORDER — ATORVASTATIN CALCIUM 10 MG PO TABS: 10.0000 mg | ORAL_TABLET | Freq: Every day | ORAL | 1 refills | Status: DC

## 2020-07-17 NOTE — Addendum Note (Signed)
Addended by: Kem Boroughs D on: 07/17/2020 11:09 AM   Modules accepted: Orders

## 2020-07-26 ENCOUNTER — Other Ambulatory Visit: Payer: Self-pay | Admitting: Family Medicine

## 2020-07-26 DIAGNOSIS — I10 Essential (primary) hypertension: Secondary | ICD-10-CM

## 2020-07-27 DIAGNOSIS — H43811 Vitreous degeneration, right eye: Secondary | ICD-10-CM | POA: Diagnosis not present

## 2020-07-27 DIAGNOSIS — H35033 Hypertensive retinopathy, bilateral: Secondary | ICD-10-CM | POA: Diagnosis not present

## 2020-07-27 DIAGNOSIS — H34832 Tributary (branch) retinal vein occlusion, left eye, with macular edema: Secondary | ICD-10-CM | POA: Diagnosis not present

## 2020-07-27 DIAGNOSIS — H43393 Other vitreous opacities, bilateral: Secondary | ICD-10-CM | POA: Diagnosis not present

## 2020-08-04 DIAGNOSIS — F330 Major depressive disorder, recurrent, mild: Secondary | ICD-10-CM | POA: Diagnosis not present

## 2020-08-04 DIAGNOSIS — F5101 Primary insomnia: Secondary | ICD-10-CM | POA: Diagnosis not present

## 2020-09-04 DIAGNOSIS — E039 Hypothyroidism, unspecified: Secondary | ICD-10-CM | POA: Diagnosis not present

## 2020-09-28 DIAGNOSIS — H34832 Tributary (branch) retinal vein occlusion, left eye, with macular edema: Secondary | ICD-10-CM | POA: Diagnosis not present

## 2020-10-26 ENCOUNTER — Telehealth: Payer: Self-pay | Admitting: Family Medicine

## 2020-10-26 NOTE — Telephone Encounter (Signed)
Patient would like to inform doctor lLowne that she took a home Covid test and it was   Positive.

## 2020-10-26 NOTE — Telephone Encounter (Signed)
FYI

## 2020-10-26 NOTE — Telephone Encounter (Signed)
Have her call us if her symptoms worsen and she needs more than otc meds

## 2020-10-27 NOTE — Telephone Encounter (Signed)
Pt aware.

## 2020-11-02 ENCOUNTER — Other Ambulatory Visit: Payer: Self-pay | Admitting: *Deleted

## 2020-11-02 MED ORDER — ALENDRONATE SODIUM 70 MG PO TABS
70.0000 mg | ORAL_TABLET | ORAL | 1 refills | Status: DC
Start: 2020-11-02 — End: 2021-04-23

## 2020-11-30 DIAGNOSIS — H34832 Tributary (branch) retinal vein occlusion, left eye, with macular edema: Secondary | ICD-10-CM | POA: Diagnosis not present

## 2020-11-30 DIAGNOSIS — H43811 Vitreous degeneration, right eye: Secondary | ICD-10-CM | POA: Diagnosis not present

## 2020-11-30 DIAGNOSIS — H35033 Hypertensive retinopathy, bilateral: Secondary | ICD-10-CM | POA: Diagnosis not present

## 2020-11-30 DIAGNOSIS — H3582 Retinal ischemia: Secondary | ICD-10-CM | POA: Diagnosis not present

## 2021-01-19 DIAGNOSIS — F5101 Primary insomnia: Secondary | ICD-10-CM | POA: Diagnosis not present

## 2021-01-19 DIAGNOSIS — F330 Major depressive disorder, recurrent, mild: Secondary | ICD-10-CM | POA: Diagnosis not present

## 2021-02-01 DIAGNOSIS — H3582 Retinal ischemia: Secondary | ICD-10-CM | POA: Diagnosis not present

## 2021-02-01 DIAGNOSIS — H43811 Vitreous degeneration, right eye: Secondary | ICD-10-CM | POA: Diagnosis not present

## 2021-02-01 DIAGNOSIS — H34832 Tributary (branch) retinal vein occlusion, left eye, with macular edema: Secondary | ICD-10-CM | POA: Diagnosis not present

## 2021-02-01 DIAGNOSIS — H43393 Other vitreous opacities, bilateral: Secondary | ICD-10-CM | POA: Diagnosis not present

## 2021-02-09 ENCOUNTER — Other Ambulatory Visit: Payer: Self-pay

## 2021-02-09 DIAGNOSIS — E785 Hyperlipidemia, unspecified: Secondary | ICD-10-CM

## 2021-02-09 MED ORDER — ATORVASTATIN CALCIUM 10 MG PO TABS: 10.0000 mg | ORAL_TABLET | Freq: Every day | ORAL | 0 refills | Status: DC

## 2021-02-10 ENCOUNTER — Other Ambulatory Visit: Payer: Self-pay | Admitting: Family Medicine

## 2021-02-10 DIAGNOSIS — I10 Essential (primary) hypertension: Secondary | ICD-10-CM

## 2021-02-10 DIAGNOSIS — E785 Hyperlipidemia, unspecified: Secondary | ICD-10-CM

## 2021-02-19 ENCOUNTER — Ambulatory Visit: Payer: Medicare PPO | Admitting: Family Medicine

## 2021-02-20 ENCOUNTER — Ambulatory Visit: Payer: Medicare PPO | Admitting: Family Medicine

## 2021-02-20 ENCOUNTER — Other Ambulatory Visit: Payer: Self-pay

## 2021-02-20 DIAGNOSIS — I10 Essential (primary) hypertension: Secondary | ICD-10-CM | POA: Diagnosis not present

## 2021-02-20 DIAGNOSIS — E785 Hyperlipidemia, unspecified: Secondary | ICD-10-CM

## 2021-02-20 MED ORDER — LISINOPRIL 10 MG PO TABS
ORAL_TABLET | ORAL | 1 refills | Status: DC
Start: 2021-02-20 — End: 2021-08-17

## 2021-02-20 MED ORDER — ATORVASTATIN CALCIUM 10 MG PO TABS: 10.0000 mg | ORAL_TABLET | Freq: Every day | ORAL | 1 refills | Status: DC

## 2021-02-20 NOTE — Patient Instructions (Signed)

## 2021-02-20 NOTE — Progress Notes (Signed)
Subjective:   By signing my name below, I, [name], attest that this documentation has been prepared under the direction and in the presence of Dr. Roma Schanz, DO. 02/20/2021     Patient ID: Cynthia May, female    DOB: [birth year], 70 y.o.   MRN: [id number]  Chief Complaint  Patient presents with  . Medication Refill    Concerns/questions: None -tlc,rma    HPI Patient is in today for a office visit. She is doing well at this time. She is requesting refills for 70 mg fosamax weekly PO and 10 mg lisinopril daily daily PO. Her blood pressure measured well during this visit.  BP Readings from Last 3 Encounters:  02/20/21 122/74  07/14/20 120/80  02/18/20 136/90   She continues to take 70 mg fosamax weekly PO. She denies having any swelling in the ankle in this time.    Past Medical History:  Diagnosis Date  . Asthma   . Depression   . Environmental allergies   . HTN (hypertension)   . Memory loss   . Pancreatitis   . Retinal vessel occlusion   . Thyroid disease     Past Surgical History:  Procedure Laterality Date  . CHOLECYSTECTOMY    . EYE SURGERY Bilateral    Cataract sx. Dr.Beavis.  . TONSILLECTOMY AND ADENOIDECTOMY      Family History  Problem Relation Age of Onset  . Arthritis Mother   . Heart disease Mother   . Stroke Mother   . Thyroid disease Mother   . Alzheimer's disease Mother   . Heart disease Father   . Colon cancer Brother   . Depression Sister   . Alzheimer's disease Sister   . Lung cancer Paternal Grandmother   . Prostate cancer Brother   . Heart disease Brother   . Depression Sister        5 silbling had depression from Thyroid disease   . Arthritis Sister   . Lung cancer Brother   . Other Sister        Creutzfeldt-Jacob Disease  . Heart attack Brother     Social History   Socioeconomic History  . Marital status: Widowed    Spouse name: Not on file  . Number of children: 3  . Years of education: Masters   . Highest education level: Not on file  Occupational History  . Occupation: Retired  Tobacco Use  . Smoking status: Former Research scientist (life sciences)  . Smokeless tobacco: Never Used  . Tobacco comment: Quit 1990  Substance and Sexual Activity  . Alcohol use: No    Alcohol/week: 0.0 standard drinks  . Drug use: No  . Sexual activity: Never  Other Topics Concern  . Not on file  Social History Narrative   Exercise--  Pt is active during day-- no organized exercise.   Lives at home alone.   Right-handed.   1-2 diet sodas per day.   Social Determinants of Health   Financial Resource Strain: Low Risk   . Difficulty of Paying Living Expenses: Not hard at all  Food Insecurity: No Food Insecurity  . Worried About Charity fundraiser in the Last Year: Never true  . Ran Out of Food in the Last Year: Never true  Transportation Needs: No Transportation Needs  . Lack of Transportation (Medical): No  . Lack of Transportation (Non-Medical): No  Physical Activity: Not on file  Stress: Not on file  Social Connections: Not on file  Intimate Partner Violence: Not on  file    Outpatient Medications Prior to Visit  Medication Sig Dispense Refill  . alendronate (FOSAMAX) 70 MG tablet Take 1 tablet (70 mg total) by mouth every 7 (seven) days. Take with a full glass of water on an empty stomach. 12 tablet 1  . DULoxetine (CYMBALTA) 60 MG capsule Take 1 capsule by mouth daily.  5  . levothyroxine (SYNTHROID) 137 MCG tablet Take by mouth.    . traZODone (DESYREL) 50 MG tablet Take 1 nightly for sleep    . atorvastatin (LIPITOR) 10 MG tablet Take 1 tablet (10 mg total) by mouth daily. Pt needs OV for further refills 30 tablet 0  . lisinopril (ZESTRIL) 10 MG tablet TAKE 1 TABLET BY MOUTH ONCE DAILY . APPOINTMENT REQUIRED FOR FUTURE REFILLS 90 tablet 1   No facility-administered medications prior to visit.    Allergies  Allergen Reactions  . Augmentin [Amoxicillin-Pot Clavulanate] Other (See Comments)    Flu like  symptoms  . Erythromycin Other (See Comments)    Flu like symptoms  . Other     Molds, trees, animal fur, grass.    Review of Systems  Cardiovascular: Negative for leg swelling.       Objective:    Physical Exam Constitutional:      General: She is not in acute distress.    Appearance: Normal appearance. She is not ill-appearing.  HENT:     Head: Normocephalic and atraumatic.     Right Ear: External ear normal.     Left Ear: External ear normal.  Eyes:     Extraocular Movements: Extraocular movements intact.     Pupils: Pupils are equal, round, and reactive to light.  Cardiovascular:     Rate and Rhythm: Normal rate and regular rhythm.     Pulses: Normal pulses.     Heart sounds: Normal heart sounds. No murmur heard. No gallop.   Pulmonary:     Effort: Pulmonary effort is normal. No respiratory distress.     Breath sounds: Normal breath sounds. No wheezing, rhonchi or rales.  Skin:    General: Skin is warm and dry.  Neurological:     Mental Status: She is alert and oriented to person, place, and time.  Psychiatric:        Behavior: Behavior normal.     BP 122/74 (BP Location: Right Arm, Patient Position: Sitting, Cuff Size: Normal)   Pulse 81   Temp 98.4 F (36.9 C) (Oral)   Resp 18   Ht 5\' 3"  (1.6 m)   Wt 153 lb (69.4 kg)   SpO2 96%   BMI 27.10 kg/m  Wt Readings from Last 3 Encounters:  02/20/21 153 lb (69.4 kg)  07/14/20 157 lb 9.6 oz (71.5 kg)  02/18/20 151 lb 9.6 oz (68.8 kg)    Diabetic Foot Exam - Simple   No data filed    Lab Results  Component Value Date   WBC 5.9 03/12/2018   HGB 15.1 (H) 03/12/2018   HCT 45.2 03/12/2018   PLT 209.0 03/12/2018   GLUCOSE 57 (L) 07/14/2020   CHOL 131 07/14/2020   TRIG 83 07/14/2020   HDL 60 07/14/2020   LDLCALC 54 07/14/2020   ALT 14 07/14/2020   AST 19 07/14/2020   NA 143 07/14/2020   K 4.1 07/14/2020   CL 107 07/14/2020   CREATININE 0.93 07/14/2020   BUN 18 07/14/2020   CO2 28 07/14/2020   TSH  4.81 (H) 03/12/2018   HGBA1C 5.6 03/12/2018  Lab Results  Component Value Date   TSH 4.81 (H) 03/12/2018   Lab Results  Component Value Date   WBC 5.9 03/12/2018   HGB 15.1 (H) 03/12/2018   HCT 45.2 03/12/2018   MCV 89.2 03/12/2018   PLT 209.0 03/12/2018   Lab Results  Component Value Date   NA 143 07/14/2020   K 4.1 07/14/2020   CO2 28 07/14/2020   GLUCOSE 57 (L) 07/14/2020   BUN 18 07/14/2020   CREATININE 0.93 07/14/2020   BILITOT 1.0 07/14/2020   ALKPHOS 68 12/13/2019   AST 19 07/14/2020   ALT 14 07/14/2020   PROT 6.2 07/14/2020   ALBUMIN 3.9 12/13/2019   CALCIUM 9.3 07/14/2020   GFR 72.95 12/13/2019   Lab Results  Component Value Date   CHOL 131 07/14/2020   Lab Results  Component Value Date   HDL 60 07/14/2020   Lab Results  Component Value Date   LDLCALC 54 07/14/2020   Lab Results  Component Value Date   TRIG 83 07/14/2020   Lab Results  Component Value Date   CHOLHDL 2.2 07/14/2020   Lab Results  Component Value Date   HGBA1C 5.6 03/12/2018       Assessment & Plan:   Problem List Items Addressed This Visit      Unprioritized   Essential hypertension    Well controlled, no changes to meds. Encouraged heart healthy diet such as the DASH diet and exercise as tolerated.       Relevant Medications   atorvastatin (LIPITOR) 10 MG tablet   lisinopril (ZESTRIL) 10 MG tablet   Other Relevant Orders   Lipid panel   Comprehensive metabolic panel   Hyperlipidemia    Encouraged heart healthy diet, increase exercise, avoid trans fats, consider a krill oil cap daily      Relevant Medications   atorvastatin (LIPITOR) 10 MG tablet   lisinopril (ZESTRIL) 10 MG tablet   Other Relevant Orders   Lipid panel   Comprehensive metabolic panel       Meds ordered this encounter  Medications  . atorvastatin (LIPITOR) 10 MG tablet    Sig: Take 1 tablet (10 mg total) by mouth daily. Pt needs OV for further refills    Dispense:  90 tablet     Refill:  1  . lisinopril (ZESTRIL) 10 MG tablet    Sig: TAKE 1 TABLET BY MOUTH ONCE DAILY . APPOINTMENT REQUIRED FOR FUTURE REFILLS    Dispense:  90 tablet    Refill:  1    I, Dr. Roma Schanz, DO, personally preformed the services described in this documentation.  All medical record entries made by the scribe were at my direction and in my presence.  I have reviewed the chart and discharge instructions (if applicable) and agree that the record reflects my personal performance and is accurate and complete. 02/20/2021   I,[name],acting as a scribe for [name], DO.,have documented all relevant documentation on the behalf of [name], DO,as directed by  [name], DO while in the presence of [name], DO.   [name], DO

## 2021-02-21 ENCOUNTER — Encounter: Payer: Self-pay | Admitting: Family Medicine

## 2021-02-21 LAB — COMPREHENSIVE METABOLIC PANEL
ALT: 13 U/L (ref 0–35)
AST: 18 U/L (ref 0–37)
Albumin: 4.3 g/dL (ref 3.5–5.2)
Alkaline Phosphatase: 65 U/L (ref 39–117)
BUN: 24 mg/dL — ABNORMAL HIGH (ref 6–23)
CO2: 29 mEq/L (ref 19–32)
Calcium: 9.8 mg/dL (ref 8.4–10.5)
Chloride: 106 mEq/L (ref 96–112)
Creatinine, Ser: 0.85 mg/dL (ref 0.40–1.20)
GFR: 68.89 mL/min (ref >60.00)
Glucose, Bld: 108 mg/dL — ABNORMAL HIGH (ref 70–99)
Potassium: 4.8 mEq/L (ref 3.5–5.1)
Sodium: 142 mEq/L (ref 135–145)
Total Bilirubin: 0.9 mg/dL (ref 0.2–1.2)
Total Protein: 6.9 g/dL (ref 6.0–8.3)

## 2021-02-21 LAB — LIPID PANEL
Cholesterol: 143 mg/dL (ref 0–200)
HDL: 55.3 mg/dL (ref >39.00)
LDL Cholesterol: 67 mg/dL (ref 0–99)
NonHDL: 87.84
Total CHOL/HDL Ratio: 3
Triglycerides: 106 mg/dL (ref 0.0–149.0)
VLDL: 21.2 mg/dL (ref 0.0–40.0)

## 2021-02-21 NOTE — Assessment & Plan Note (Signed)
Encouraged heart healthy diet, increase exercise, avoid trans fats, consider a krill oil cap daily 

## 2021-02-21 NOTE — Assessment & Plan Note (Signed)
Well controlled, no changes to meds. Encouraged heart healthy diet such as the DASH diet and exercise as tolerated.  °

## 2021-03-05 DIAGNOSIS — Z1231 Encounter for screening mammogram for malignant neoplasm of breast: Secondary | ICD-10-CM | POA: Diagnosis not present

## 2021-03-05 LAB — HM MAMMOGRAPHY

## 2021-04-05 DIAGNOSIS — H43811 Vitreous degeneration, right eye: Secondary | ICD-10-CM | POA: Diagnosis not present

## 2021-04-05 DIAGNOSIS — H35033 Hypertensive retinopathy, bilateral: Secondary | ICD-10-CM | POA: Diagnosis not present

## 2021-04-05 DIAGNOSIS — H3582 Retinal ischemia: Secondary | ICD-10-CM | POA: Diagnosis not present

## 2021-04-05 DIAGNOSIS — H34832 Tributary (branch) retinal vein occlusion, left eye, with macular edema: Secondary | ICD-10-CM | POA: Diagnosis not present

## 2021-04-21 ENCOUNTER — Other Ambulatory Visit: Payer: Self-pay | Admitting: Family Medicine

## 2021-04-30 ENCOUNTER — Telehealth: Payer: Self-pay | Admitting: Family Medicine

## 2021-04-30 NOTE — Telephone Encounter (Signed)
Copied from [facility] #[id number]. Topic: Medicare AWV >> Apr 30, 2021  9:38 AM Harris-Coley, Hannah Beat wrote: Reason for CRM: Left message for patient to schedule Annual Wellness Visit.  Please schedule with Health Nurse Advisor Augustine Radar. at [facility]

## 2021-05-10 ENCOUNTER — Other Ambulatory Visit: Payer: Self-pay

## 2021-05-10 ENCOUNTER — Ambulatory Visit (INDEPENDENT_AMBULATORY_CARE_PROVIDER_SITE_OTHER): Payer: Medicare PPO

## 2021-05-10 VITALS — BP 128/86 | HR 83 | Temp 97.9°F | Resp 16 | Ht 63.0 in | Wt 151.8 lb

## 2021-05-10 DIAGNOSIS — Z0000 Encounter for general adult medical examination without abnormal findings: Secondary | ICD-10-CM | POA: Diagnosis not present

## 2021-05-10 NOTE — Progress Notes (Signed)
Subjective:   Cynthia May is a 70 y.o. female who presents for Medicare Annual (Subsequent) preventive examination.   Review of Systems     Cardiac Risk Factors include: advanced age (>58mn, >>70women);dyslipidemia;hypertension     Objective:    Today's Vitals   05/10/21 1415  BP: 128/86  Pulse: 83  Resp: 16  Temp: 97.9 F (36.6 C)  TempSrc: Temporal  SpO2: 95%  Weight: 151 lb 12.8 oz (68.9 kg)  Height: '5\' 3"'$  (1.6 m)   Body mass index is 26.89 kg/m.  Advanced Directives 05/10/2021 05/03/2020 01/29/2019 01/27/2018 01/24/2017 11/10/2015 10/15/2014  Does Patient Have a Medical Advance Directive? Yes Yes Yes No No No No  Type of [place]amed[place]of [place] will [place] will - - - -  Does patient want to make changes to medical advance directive? - No - Patient declined No - Patient declined - - Yes - information given -  Copy of [place] Chart? Yes - validated most recent copy scanned in chart (See row information) Yes - validated most recent copy scanned in chart (See row information) No - copy requested - - No - copy requested -  Would patient like information on creating a medical advance directive? - - - No - Patient declined No - Patient declined Yes - Educational materials given No - patient declined information    Current Medications (verified) Outpatient Encounter Medications as of 05/10/2021  Medication Sig   alendronate (FOSAMAX) 70 MG tablet TAKE 1 TABLET BY MOUTH EVERY 7 DAYS.TAKE WITH FULL GLASS OF WATER ON AN EMPTY STOMACH.   atorvastatin (LIPITOR) 10 MG tablet Take 1 tablet (10 mg total) by mouth daily. Pt needs OV for further refills   DULoxetine (CYMBALTA) 60 MG capsule Take 1 capsule by mouth daily.   levothyroxine (SYNTHROID) 137 MCG tablet Take by mouth.   lisinopril (ZESTRIL) 10 MG tablet TAKE 1 TABLET BY MOUTH ONCE DAILY . APPOINTMENT  REQUIRED FOR FUTURE REFILLS   traZODone (DESYREL) 50 MG tablet Take 1 nightly for sleep   No facility-administered encounter medications on file as of 05/10/2021.    Allergies (verified) Augmentin [amoxicillin-pot clavulanate], Erythromycin, and Other   History: Past Medical History:  Diagnosis Date   Asthma    Depression    Environmental allergies    HTN (hypertension)    Memory loss    Pancreatitis    Retinal vessel occlusion    Thyroid disease    Past Surgical History:  Procedure Laterality Date   CHOLECYSTECTOMY     EYE SURGERY Bilateral    Cataract sx. Dr.Beavis.   TONSILLECTOMY AND ADENOIDECTOMY     Family History  Problem Relation Age of Onset   Arthritis Mother    Heart disease Mother    Stroke Mother    Thyroid disease Mother    Alzheimer's disease Mother    Heart disease Father    Colon cancer Brother    Depression Sister    Alzheimer's disease Sister    Lung cancer Paternal Grandmother    Prostate cancer Brother    Heart disease Brother    Depression Sister        5 silbling had depression from Thyroid disease    Arthritis Sister    Lung cancer Brother    Other Sister        Creutzfeldt-Jacob Disease   Heart attack Brother    Social History   Socioeconomic  History   Marital status: Widowed    Spouse name: Not on file   Number of children: 3   Years of education: Masters   Highest education level: Not on file  Occupational History   Occupation: Retired  Tobacco Use   Smoking status: Former   Smokeless tobacco: Never   Tobacco comments:    Quit 1990  Substance and Sexual Activity   Alcohol use: No    Alcohol/week: 0.0 standard drinks   Drug use: No   Sexual activity: Never  Other Topics Concern   Not on file  Social History Narrative   Exercise--  Pt is active during day-- no organized exercise.   Lives at home alone.   Right-handed.   1-2 diet sodas per day.   Social Determinants of Health   Financial Resource Strain: Low Risk     Difficulty of Paying Living Expenses: Not hard at all  Food Insecurity: No Food Insecurity   Worried About Charity fundraiser in the Last Year: Never true   [place] in the Last Year: Never true  Transportation Needs: No Transportation Needs   Lack of Transportation (Medical): No   Lack of Transportation (Non-Medical): No  Physical Activity: Sufficiently Active   Days of Exercise per Week: 7 days   Minutes of Exercise per Session: 30 min  Stress: No Stress Concern Present   Feeling of Stress : Not at all  Social Connections: Moderately Integrated   Frequency of Communication with Friends and Family: More than three times a week   Frequency of Social Gatherings with Friends and Family: More than three times a week   Attends Religious Services: More than 4 times per year   Active Member of [employer] or Organizations: Yes   Attends Archivist Meetings: More than 4 times per year   Marital Status: Widowed    Tobacco Counseling Counseling given: Not Answered Tobacco comments: Quit 1990   Clinical Intake:  Pre-visit preparation completed: Yes  Pain : No/denies pain     Nutritional Status: BMI 25 -29 Overweight Nutritional Risks: None Diabetes: No  How often do you need to have someone help you when you read instructions, pamphlets, or other written materials from your doctor or pharmacy?: 1 - Never  Diabetic?No  Interpreter Needed?: No  Information entered by :: Caroleen Hamman LPN   Activities of Daily Living In your present state of health, do you have any difficulty performing the following activities: 05/10/2021  Hearing? N  Vision? N  Difficulty concentrating or making decisions? N  Walking or climbing stairs? N  Dressing or bathing? N  Doing errands, shopping? N  Preparing Food and eating ? N  Using the Toilet? N  In the past six months, have you accidently leaked urine? N  Do you have problems with loss of bowel control? N  Managing your  Medications? N  Managing your Finances? N  Housekeeping or managing your Housekeeping? N  Some recent data might be hidden    Patient Care Team: Carollee Herter, Alferd Apa, DO as PCP - General (Family Medicine) Lendon Colonel, MD as Referring Physician (Psychiatry) Amalia Greenhouse, MD as Referring Physician (Endocrinology) Desma Maxim, MD as Consulting Physician (Ophthalmology)  Indicate any recent Medical Services you may have received from other than [facility] providers in the past year (date may be approximate).     Assessment:   This is a routine wellness examination for Emmajean.  Hearing/Vision screen Hearing Screening - Comments::  No issues Vision Screening - Comments:: Wears glasses Last eye exam-2022-Dr. Iona Hansen  Dietary issues and exercise activities discussed: Current Exercise Habits: Home exercise routine, Type of exercise: walking (water aerobics), Time (Minutes): 30, Frequency (Times/Week): 7, Weekly Exercise (Minutes/Week): 210, Intensity: Mild, Exercise limited by: None identified   Goals Addressed             This Visit's Progress    Increase physical activity   On track      Depression Screen PHQ 2/9 Scores 05/10/2021 05/03/2020 01/29/2019 01/27/2018 01/24/2017 11/10/2015 11/10/2015  PHQ - 2 Score 0 0 0 1 0 0 0    Fall Risk Fall Risk  05/10/2021 05/03/2020 01/29/2019 01/27/2018 01/24/2017  Falls in the past year? 0 0 0 No No  Number falls in past yr: 0 0 - - -  Injury with Fall? 0 0 - - -  Follow up Falls prevention discussed Education provided;Falls prevention discussed - - -    FALL RISK PREVENTION PERTAINING TO THE HOME:  Any stairs in or around the home? No  Home free of loose throw rugs in walkways, pet beds, electrical cords, etc? Yes  Adequate lighting in your home to reduce risk of falls? Yes   ASSISTIVE DEVICES UTILIZED TO PREVENT FALLS:  Life alert? No  Use of a cane, walker or w/c? No  Grab bars in the bathroom? No  Shower chair or bench in shower? No   Elevated toilet seat or a handicapped toilet? No   TIMED UP AND GO:  Was the test performed? Yes .  Length of time to ambulate 10 feet: 10 sec.   Gait steady and fast without use of assistive device  Cognitive Function:Patient is currently enrolled in a memory/brain study. MMSE - Mini Mental State Exam 01/24/2017 08/28/2016 02/28/2016 11/10/2015  Orientation to time '5 5 5 5  '$ Orientation to Place '5 5 5 5  '$ Registration '3 3 3 3  '$ Attention/ Calculation '5 5 5 5  '$ Recall '3 3 3 3  '$ Language- name 2 objects '2 2 2 2  '$ Language- repeat '1 1 1 1  '$ Language- follow 3 step command '3 3 3 3  '$ Language- read & follow direction '1 1 1 1  '$ Write a sentence '1 1 1 1  '$ Copy design '1 1 1 1  '$ Total score '30 30 30 30        '$ Immunizations Immunization History  Administered Date(s) Administered   Influenza, High Dose Seasonal PF 07/04/2016, 06/29/2018, 06/07/2019   Influenza-Unspecified 07/02/2017, 06/07/2019, 06/14/2020   PFIZER(Purple Top)SARS-COV-2 Vaccination 11/20/2019, 12/14/2019, 06/26/2020   Pneumococcal Conjugate-13 01/19/2015   Pneumococcal Polysaccharide-23 01/24/2016   Tdap 01/19/2015   Zoster Recombinat (Shingrix) 03/15/2017, 05/31/2017   Zoster, Live 07/11/2010    TDAP status: Up to date  Flu Vaccine status: Up to date  Pneumococcal vaccine status: Up to date  Covid Vaccines: 2nd Booster due  Qualifies for Shingles Vaccine? No   Zostavax completed Yes   Shingrix Completed?: Yes  Screening Tests Health Maintenance  Topic Date Due   COVID-19 Vaccine (4 - Booster for Pfizer series) 09/25/2020   INFLUENZA VACCINE  04/30/2021   COLONOSCOPY (Pts 45-1yr Insurance coverage will need to be confirmed)  07/16/2021   MAMMOGRAM  03/06/2023   TETANUS/TDAP  01/18/2025   DEXA SCAN  Completed   Hepatitis C Screening  Completed   PNA vac Low Risk Adult  Completed   Zoster Vaccines- Shingrix  Completed   HPV VACCINES  Aged [employer]  Maintenance  Health Maintenance Due  Topic Date  Due   COVID-19 Vaccine (4 - Booster for Pfizer series) 09/25/2020   INFLUENZA VACCINE  04/30/2021    Colorectal cancer screening: Type of screening: Colonoscopy. Completed 07/17/2011. Repeat every 10 years  Mammogram status: Completed Bilateral 03/05/2021. Repeat every year  Bone Density status: Completed 05/12/2020. Results reflect: Bone density results: OSTEOPOROSIS. Repeat every 2 years.  Lung Cancer Screening: (Low Dose CT Chest recommended if Age 14-80 years, 30 pack-year currently smoking OR have quit w/in 15years.) does not qualify.     Additional Screening:  Hepatitis C Screening: Completed 11/10/2015  Vision Screening: Recommended annual ophthalmology exams for early detection of glaucoma and other disorders of the eye. Is the patient up to date with their annual eye exam?  Yes  Who is the provider or what is the name of the office in which the patient attends annual eye exams? Dr. Iona Hansen   Dental Screening: Recommended annual dental exams for proper oral hygiene  Community Resource Referral / Chronic Care Management: CRR required this visit?  No   CCM required this visit?  No      Plan:     I have personally reviewed and noted the following in the patient's chart:   Medical and social history Use of alcohol, tobacco or illicit drugs  Current medications and supplements including opioid prescriptions.  Functional ability and status Nutritional status Physical activity Advanced directives List of other physicians Hospitalizations, surgeries, and ER visits in previous 12 months Vitals Screenings to include cognitive, depression, and falls Referrals and appointments  In addition, I have reviewed and discussed with patient certain preventive protocols, quality metrics, and best practice recommendations. A written personalized care plan for preventive services as well as general preventive health recommendations were provided to patient.   Patient to access avs on  mychart   Marta Antu, [place]   [phone]  Nurse Health Advisor  Nurse Notes: None

## 2021-05-10 NOTE — Patient Instructions (Signed)
Cynthia May , Thank you for taking time to come for your Medicare Wellness Visit. I appreciate your ongoing commitment to your health goals. Please review the following plan we discussed and let me know if I can assist you in the future.   Screening recommendations/referrals: Colonoscopy: Per our conversation, you completed a Cologuard test within the past year. No report in chart.  Mammogram: Completed 03/05/2021-Due 03/05/2022 Bone Density: Completed 05/12/2020-Due 05/12/2022 Recommended yearly ophthalmology/optometry visit for glaucoma screening and checkup Recommended yearly dental visit for hygiene and checkup  Vaccinations: Influenza vaccine: Up to date Pneumococcal vaccine: Up to date Tdap vaccine: Up to date-Due-01/18/2025 Shingles vaccine: Completed vaccines   Covid-19:Booster due  Advanced directives: Copy in chart  Conditions/risks identified: See problem list  Next appointment: Follow up in one year for your annual wellness visit 05/13/2022 @ 2:20   Preventive Care 72 Years and Older, Female Preventive care refers to lifestyle choices and visits with your health care provider that can promote health and wellness. What does preventive care include? A yearly physical exam. This is also called an annual well check. Dental exams once or twice a year. Routine eye exams. Ask your health care provider how often you should have your eyes checked. Personal lifestyle choices, including: Daily care of your teeth and gums. Regular physical activity. Eating a healthy diet. Avoiding tobacco and drug use. Limiting alcohol use. Practicing safe sex. Taking low-dose aspirin every day. Taking vitamin and mineral supplements as recommended by your health care provider. What happens during an annual well check? The services and screenings done by your health care provider during your annual well check will depend on your age, overall health, lifestyle risk factors, and family history of  disease. Counseling  Your health care provider may ask you questions about your: Alcohol use. Tobacco use. Drug use. Emotional well-being. Home and relationship well-being. Sexual activity. Eating habits. History of falls. Memory and ability to understand (cognition). Work and work Statistician. Reproductive health. Screening  You may have the following tests or measurements: Height, weight, and BMI. Blood pressure. Lipid and cholesterol levels. These may be checked every 5 years, or more frequently if you are over 54 years old. Skin check. Lung cancer screening. You may have this screening every year starting at age 72 if you have a 30-pack-year history of smoking and currently smoke or have quit within the past 15 years. Fecal occult blood test (FOBT) of the stool. You may have this test every year starting at age 72 Flexible sigmoidoscopy or colonoscopy. You may have a sigmoidoscopy every 5 years or a colonoscopy every 10 years starting at age 72. Hepatitis C blood test. Hepatitis B blood test. Sexually transmitted disease (STD) testing. Diabetes screening. This is done by checking your blood sugar (glucose) after you have not eaten for a while (fasting). You may have this done every 1-3 years. Bone density scan. This is done to screen for osteoporosis. You may have this done starting at age 72. Mammogram. This may be done every 1-2 years. Talk to your health care provider about how often you should have regular mammograms. Talk with your health care provider about your test results, treatment options, and if necessary, the need for more tests. Vaccines  Your health care provider may recommend certain vaccines, such as: Influenza vaccine. This is recommended every year. Tetanus, diphtheria, and acellular pertussis (Tdap, Td) vaccine. You may need a Td booster every 10 years. Zoster vaccine. You may need this after age 72. Pneumococcal  13-valent conjugate (PCV13) vaccine. One  dose is recommended after age 76. Pneumococcal polysaccharide (PPSV23) vaccine. One dose is recommended after age 72. Talk to your health care provider about which screenings and vaccines you need and how often you need them. This information is not intended to replace advice given to you by your health care provider. Make sure you discuss any questions you have with your health care provider. Document Released: 10/13/2015 Document Revised: 06/05/2016 Document Reviewed: 07/18/2015 Elsevier Interactive Patient Education  2017 [place] [facility] [place] Prevention in the Home Falls can cause injuries. They can happen to people of all ages. There are many things you can do to make your home safe and to help prevent falls. What can I do on the outside of my home? Regularly fix the edges of walkways and driveways and fix any cracks. Remove anything that might make you trip as you walk through a door, such as a raised step or threshold. Trim any bushes or trees on the path to your home. Use bright outdoor lighting. Clear any walking paths of anything that might make someone trip, such as rocks or tools. Regularly check to see if handrails are loose or broken. Make sure that both sides of any steps have handrails. Any raised decks and porches should have guardrails on the edges. Have any leaves, snow, or ice cleared regularly. Use sand or salt on walking paths during winter. Clean up any spills in your garage right away. This includes oil or grease spills. What can I do in the bathroom? Use night lights. Install grab bars by the toilet and in the tub and shower. Do not use towel bars as grab bars. Use non-skid mats or decals in the tub or shower. If you need to sit down in the shower, use a plastic, non-slip stool. Keep the floor dry. Clean up any water that spills on the floor as soon as it happens. Remove soap buildup in the tub or shower regularly. Attach bath mats securely with double-sided  non-slip rug tape. Do not have throw rugs and other things on the floor that can make you trip. What can I do in the bedroom? Use night lights. Make sure that you have a light by your bed that is easy to reach. Do not use any sheets or blankets that are too big for your bed. They should not hang down onto the floor. Have a firm chair that has side arms. You can use this for support while you get dressed. Do not have throw rugs and other things on the floor that can make you trip. What can I do in the kitchen? Clean up any spills right away. Avoid walking on wet floors. Keep items that you use a lot in easy-to-reach places. If you need to reach something above you, use a strong step stool that has a grab bar. Keep electrical cords out of the way. Do not use floor polish or wax that makes floors slippery. If you must use wax, use non-skid floor wax. Do not have throw rugs and other things on the floor that can make you trip. What can I do with my stairs? Do not leave any items on the stairs. Make sure that there are handrails on both sides of the stairs and use them. Fix handrails that are broken or loose. Make sure that handrails are as long as the stairways. Check any carpeting to make sure that it is firmly attached to the stairs. Fix any carpet  that is loose or worn. Avoid having throw rugs at the top or bottom of the stairs. If you do have throw rugs, attach them to the floor with carpet tape. Make sure that you have a light switch at the top of the stairs and the bottom of the stairs. If you do not have them, ask someone to add them for you. What else can I do to help prevent falls? Wear shoes that: Do not have high heels. Have rubber bottoms. Are comfortable and fit you well. Are closed at the toe. Do not wear sandals. If you use a stepladder: Make sure that it is fully opened. Do not climb a closed stepladder. Make sure that both sides of the stepladder are locked into place. Ask  someone to hold it for you, if possible. Clearly mark and make sure that you can see: Any grab bars or handrails. First and last steps. Where the edge of each step is. Use tools that help you move around (mobility aids) if they are needed. These include: Canes. Walkers. Scooters. Crutches. Turn on the lights when you go into a dark area. Replace any light bulbs as soon as they burn out. Set up your furniture so you have a clear path. Avoid moving your furniture around. If any of your floors are uneven, fix them. If there are any pets around you, be aware of where they are. Review your medicines with your doctor. Some medicines can make you feel dizzy. This can increase your chance of falling. Ask your doctor what other things that you can do to help prevent falls. This information is not intended to replace advice given to you by your health care provider. Make sure you discuss any questions you have with your health care provider. Document Released: 07/13/2009 Document Revised: 02/22/2016 Document Reviewed: 10/21/2014 Elsevier Interactive Patient Education  2017 [employer].

## 2021-06-02 DIAGNOSIS — I10 Essential (primary) hypertension: Secondary | ICD-10-CM | POA: Diagnosis not present

## 2021-06-02 DIAGNOSIS — M810 Age-related osteoporosis without current pathological fracture: Secondary | ICD-10-CM | POA: Diagnosis not present

## 2021-06-02 DIAGNOSIS — G4700 Insomnia, unspecified: Secondary | ICD-10-CM | POA: Diagnosis not present

## 2021-06-02 DIAGNOSIS — H547 Unspecified visual loss: Secondary | ICD-10-CM | POA: Diagnosis not present

## 2021-06-02 DIAGNOSIS — E785 Hyperlipidemia, unspecified: Secondary | ICD-10-CM | POA: Diagnosis not present

## 2021-06-02 DIAGNOSIS — Z803 Family history of malignant neoplasm of breast: Secondary | ICD-10-CM | POA: Diagnosis not present

## 2021-06-02 DIAGNOSIS — F3342 Major depressive disorder, recurrent, in full remission: Secondary | ICD-10-CM | POA: Diagnosis not present

## 2021-06-02 DIAGNOSIS — E039 Hypothyroidism, unspecified: Secondary | ICD-10-CM | POA: Diagnosis not present

## 2021-06-02 DIAGNOSIS — Z7983 Long term (current) use of bisphosphonates: Secondary | ICD-10-CM | POA: Diagnosis not present

## 2021-06-14 DIAGNOSIS — H34832 Tributary (branch) retinal vein occlusion, left eye, with macular edema: Secondary | ICD-10-CM | POA: Diagnosis not present

## 2021-06-14 DIAGNOSIS — H35033 Hypertensive retinopathy, bilateral: Secondary | ICD-10-CM | POA: Diagnosis not present

## 2021-06-14 DIAGNOSIS — H43811 Vitreous degeneration, right eye: Secondary | ICD-10-CM | POA: Diagnosis not present

## 2021-06-14 DIAGNOSIS — H3582 Retinal ischemia: Secondary | ICD-10-CM | POA: Diagnosis not present

## 2021-06-28 DIAGNOSIS — J029 Acute pharyngitis, unspecified: Secondary | ICD-10-CM | POA: Diagnosis not present

## 2021-06-28 DIAGNOSIS — Z20822 Contact with and (suspected) exposure to covid-19: Secondary | ICD-10-CM | POA: Diagnosis not present

## 2021-06-28 DIAGNOSIS — K122 Cellulitis and abscess of mouth: Secondary | ICD-10-CM | POA: Diagnosis not present

## 2021-07-06 DIAGNOSIS — E039 Hypothyroidism, unspecified: Secondary | ICD-10-CM | POA: Diagnosis not present

## 2021-07-11 ENCOUNTER — Other Ambulatory Visit: Payer: Self-pay | Admitting: Family Medicine

## 2021-07-11 DIAGNOSIS — F330 Major depressive disorder, recurrent, mild: Secondary | ICD-10-CM | POA: Diagnosis not present

## 2021-07-11 DIAGNOSIS — F5101 Primary insomnia: Secondary | ICD-10-CM | POA: Diagnosis not present

## 2021-08-16 DIAGNOSIS — H34832 Tributary (branch) retinal vein occlusion, left eye, with macular edema: Secondary | ICD-10-CM | POA: Diagnosis not present

## 2021-08-16 DIAGNOSIS — H35033 Hypertensive retinopathy, bilateral: Secondary | ICD-10-CM | POA: Diagnosis not present

## 2021-08-16 DIAGNOSIS — H3582 Retinal ischemia: Secondary | ICD-10-CM | POA: Diagnosis not present

## 2021-08-16 DIAGNOSIS — H43811 Vitreous degeneration, right eye: Secondary | ICD-10-CM | POA: Diagnosis not present

## 2021-08-17 ENCOUNTER — Other Ambulatory Visit: Payer: Self-pay

## 2021-08-17 ENCOUNTER — Encounter: Payer: Self-pay | Admitting: Family Medicine

## 2021-08-17 ENCOUNTER — Ambulatory Visit: Payer: Medicare PPO | Admitting: Family Medicine

## 2021-08-17 VITALS — BP 132/90 | HR 88 | Temp 98.2°F | Resp 18 | Ht 63.0 in | Wt 153.0 lb

## 2021-08-17 DIAGNOSIS — I10 Essential (primary) hypertension: Secondary | ICD-10-CM

## 2021-08-17 DIAGNOSIS — J0140 Acute pansinusitis, unspecified: Secondary | ICD-10-CM

## 2021-08-17 DIAGNOSIS — E785 Hyperlipidemia, unspecified: Secondary | ICD-10-CM

## 2021-08-17 DIAGNOSIS — J40 Bronchitis, not specified as acute or chronic: Secondary | ICD-10-CM | POA: Diagnosis not present

## 2021-08-17 MED ORDER — ATORVASTATIN CALCIUM 10 MG PO TABS: 10.0000 mg | ORAL_TABLET | Freq: Every day | ORAL | 1 refills | Status: DC

## 2021-08-17 MED ORDER — PROMETHAZINE-DM 6.25-15 MG/5ML PO SYRP: 5.0000 mL | ORAL_SOLUTION | Freq: Four times a day (QID) | ORAL | 0 refills | Status: DC | PRN

## 2021-08-17 MED ORDER — LISINOPRIL 10 MG PO TABS: ORAL_TABLET | ORAL | 1 refills | Status: DC

## 2021-08-17 MED ORDER — AZITHROMYCIN 250 MG PO TABS: ORAL_TABLET | ORAL | 0 refills | Status: DC

## 2021-08-17 MED ORDER — FLUTICASONE PROPIONATE 50 MCG/ACT NA SUSP
2.0000 | Freq: Every day | NASAL | 6 refills | Status: DC
Start: 2021-08-17 — End: 2021-11-28

## 2021-08-17 NOTE — Assessment & Plan Note (Signed)
abx per orders flonase  rto prn  

## 2021-08-17 NOTE — Progress Notes (Signed)
Subjective:   By signing my name below, I, [name], attest that this documentation has been prepared under the direction and in the presence of [name], DO. 08/17/2021     Patient ID: Cynthia May, female    DOB: 07-12-1951, 70 y.o.   MRN: [id number]  Chief Complaint  Patient presents with   Hypertension   Hyperlipidemia   Follow-up    HPI Patient is in today for an office visit.  She was having symptoms of headache, fatigue, sore throat, congestion productive cough and has been using mucinex to manage the symptoms. The symptoms have lessened but she still has a persistent cough. She coughs more at night and it keeps her awake. She also has a little sinus pressure on her forehead.   Her blood pressure is a little high at this visit. BP Readings from Last 3 Encounters:  08/17/21 132/90  05/10/21 128/86  02/20/21 122/74    She has received the flu vaccine.   Swollen glands, theaot   Past Medical History:  Diagnosis Date   Asthma    Depression    Environmental allergies    HTN (hypertension)    Memory loss    Pancreatitis    Retinal vessel occlusion    Thyroid disease     Past Surgical History:  Procedure Laterality Date   CHOLECYSTECTOMY     EYE SURGERY Bilateral    Cataract sx. Dr.Beavis.   TONSILLECTOMY AND ADENOIDECTOMY      Family History  Problem Relation Age of Onset   Arthritis Mother    Heart disease Mother    Stroke Mother    Thyroid disease Mother    Alzheimer's disease Mother    Heart disease Father    Colon cancer Brother    Depression Sister    Alzheimer's disease Sister    Lung cancer Paternal Grandmother    Prostate cancer Brother    Heart disease Brother    Depression Sister        5 silbling had depression from Thyroid disease    Arthritis Sister    Lung cancer Brother    Other Sister        Creutzfeldt-Jacob Disease   Heart attack Brother     Social History   Socioeconomic History   Marital status:  Widowed    Spouse name: Not on file   Number of children: 3   Years of education: Masters   Highest education level: Not on file  Occupational History   Occupation: Retired  Tobacco Use   Smoking status: Former   Smokeless tobacco: Never   Tobacco comments:    Quit 1990  Substance and Sexual Activity   Alcohol use: No    Alcohol/week: 0.0 standard drinks   Drug use: No   Sexual activity: Never  Other Topics Concern   Not on file  Social History Narrative   Exercise--  Pt is active during day-- no organized exercise.   Lives at home alone.   Right-handed.   1-2 diet sodas per day.   Social Determinants of Health   Financial Resource Strain: Low Risk    Difficulty of Paying Living Expenses: Not hard at all  Food Insecurity: No Food Insecurity   Worried About Charity fundraiser in the Last Year: Never true   [place] in the Last Year: Never true  Transportation Needs: No Transportation Needs   Lack of Transportation (Medical): No   Lack of Transportation (Non-Medical):  No  Physical Activity: Sufficiently Active   Days of Exercise per Week: 7 days   Minutes of Exercise per Session: 30 min  Stress: No Stress Concern Present   Feeling of Stress : Not at all  Social Connections: Moderately Integrated   Frequency of Communication with Friends and Family: More than three times a week   Frequency of Social Gatherings with Friends and Family: More than three times a week   Attends Religious Services: More than 4 times per year   Active Member of [employer] or Organizations: Yes   Attends Archivist Meetings: More than 4 times per year   Marital Status: Widowed  Human resources officer Violence: Not At Risk   Fear of Current or Ex-Partner: No   Emotionally Abused: No   Physically Abused: No   Sexually Abused: No    Outpatient Medications Prior to Visit  Medication Sig Dispense Refill   alendronate (FOSAMAX) 70 MG tablet TAKE 1 TABLET BY EVERY 7 DAYS TAKE  ON  EMPTY   STOMACH  WITH  FULL  GLASS  OF  WATER 12 tablet 2   DULoxetine (CYMBALTA) 60 MG capsule Take 1 capsule by mouth daily.  5   levothyroxine (SYNTHROID) 137 MCG tablet Take by mouth.     traZODone (DESYREL) 50 MG tablet Take 1 nightly for sleep     atorvastatin (LIPITOR) 10 MG tablet Take 1 tablet (10 mg total) by mouth daily. Pt needs OV for further refills 90 tablet 1   lisinopril (ZESTRIL) 10 MG tablet TAKE 1 TABLET BY MOUTH ONCE DAILY . APPOINTMENT REQUIRED FOR FUTURE REFILLS 90 tablet 1   No facility-administered medications prior to visit.    Allergies  Allergen Reactions   Augmentin [Amoxicillin-Pot Clavulanate] Other (See Comments)    Flu like symptoms   Erythromycin Other (See Comments)    Flu like symptoms   Other     Molds, trees, animal fur, grass.    Review of Systems  Constitutional:  Positive for malaise/fatigue. Negative for fever.  HENT:  Positive for congestion and sore throat. Negative for ear pain, hearing loss and sinus pain.        (+) sinus pressure   Eyes:  Negative for blurred vision and pain.  Respiratory:  Positive for cough and sputum production. Negative for shortness of breath and wheezing.   Cardiovascular:  Negative for chest pain and palpitations.  Gastrointestinal:  Negative for blood in stool, constipation, diarrhea, nausea and vomiting.  Genitourinary:  Negative for dysuria, frequency, hematuria and urgency.  Musculoskeletal:  Negative for back pain, falls and myalgias.  Neurological:  Positive for headaches. Negative for dizziness, sensory change, loss of consciousness and weakness.  Endo/Heme/Allergies:  Negative for environmental allergies. Does not bruise/bleed easily.  Psychiatric/Behavioral:  Negative for depression and suicidal ideas. The patient is not nervous/anxious and does not have insomnia.       Objective:    Physical Exam Constitutional:      General: She is not in acute distress.    Appearance: Normal appearance. She is not  ill-appearing.  HENT:     Head: Normocephalic and atraumatic.     Right Ear: Tympanic membrane, ear canal and external ear normal.     Left Ear: Tympanic membrane, ear canal and external ear normal.     Mouth/Throat:     Comments: Erythematous throat   Eyes:     Extraocular Movements: Extraocular movements intact.     Pupils: Pupils are equal, round, and reactive  to light.  Cardiovascular:     Rate and Rhythm: Normal rate and regular rhythm.     Pulses: Normal pulses.     Heart sounds: Normal heart sounds. No murmur heard.   No gallop.  Pulmonary:     Effort: Pulmonary effort is normal. No respiratory distress.     Breath sounds: Normal breath sounds. No wheezing, rhonchi or rales.  Abdominal:     General: Bowel sounds are normal. There is no distension.     Palpations: Abdomen is soft. There is no mass.     Tenderness: There is no abdominal tenderness. There is no guarding or rebound.     Hernia: No hernia is present.  Musculoskeletal:     Cervical back: Normal range of motion and neck supple.  Lymphadenopathy:     Cervical: Cervical adenopathy present.  Skin:    General: Skin is warm and dry.  Neurological:     Mental Status: She is alert and oriented to person, place, and time.  Psychiatric:        Behavior: Behavior normal.    BP 132/90 (BP Location: Left Arm, Patient Position: Sitting, Cuff Size: Normal)   Pulse 88   Temp 98.2 F (36.8 C) (Oral)   Resp 18   Ht 5\' 3"  (1.6 m)   Wt 153 lb (69.4 kg)   SpO2 98%   BMI 27.10 kg/m  Wt Readings from Last 3 Encounters:  08/17/21 153 lb (69.4 kg)  05/10/21 151 lb 12.8 oz (68.9 kg)  02/20/21 153 lb (69.4 kg)    Diabetic Foot Exam - Simple   No data filed    Lab Results  Component Value Date   WBC 5.9 03/12/2018   HGB 15.1 (H) 03/12/2018   HCT 45.2 03/12/2018   PLT 209.0 03/12/2018   GLUCOSE 108 (H) 02/20/2021   CHOL 143 02/20/2021   TRIG 106.0 02/20/2021   HDL 55.30 02/20/2021   LDLCALC 67 02/20/2021   ALT  13 02/20/2021   AST 18 02/20/2021   NA 142 02/20/2021   K 4.8 02/20/2021   CL 106 02/20/2021   CREATININE 0.85 02/20/2021   BUN 24 (H) 02/20/2021   CO2 29 02/20/2021   TSH 4.81 (H) 03/12/2018   HGBA1C 5.6 03/12/2018    Lab Results  Component Value Date   TSH 4.81 (H) 03/12/2018   Lab Results  Component Value Date   WBC 5.9 03/12/2018   HGB 15.1 (H) 03/12/2018   HCT 45.2 03/12/2018   MCV 89.2 03/12/2018   PLT 209.0 03/12/2018   Lab Results  Component Value Date   NA 142 02/20/2021   K 4.8 02/20/2021   CO2 29 02/20/2021   GLUCOSE 108 (H) 02/20/2021   BUN 24 (H) 02/20/2021   CREATININE 0.85 02/20/2021   BILITOT 0.9 02/20/2021   ALKPHOS 65 02/20/2021   AST 18 02/20/2021   ALT 13 02/20/2021   PROT 6.9 02/20/2021   ALBUMIN 4.3 02/20/2021   CALCIUM 9.8 02/20/2021   GFR 68.89 02/20/2021   Lab Results  Component Value Date   CHOL 143 02/20/2021   Lab Results  Component Value Date   HDL 55.30 02/20/2021   Lab Results  Component Value Date   LDLCALC 67 02/20/2021   Lab Results  Component Value Date   TRIG 106.0 02/20/2021   Lab Results  Component Value Date   CHOLHDL 3 02/20/2021   Lab Results  Component Value Date   HGBA1C 5.6 03/12/2018  Assessment & Plan:   Problem List Items Addressed This Visit       Unprioritized   Essential hypertension   Relevant Medications   lisinopril (ZESTRIL) 10 MG tablet   atorvastatin (LIPITOR) 10 MG tablet   Other Relevant Orders   CBC with Differential/Platelet   Comprehensive metabolic panel   Lipid panel   Hyperlipidemia   Relevant Medications   lisinopril (ZESTRIL) 10 MG tablet   atorvastatin (LIPITOR) 10 MG tablet   Other Relevant Orders   CBC with Differential/Platelet   Comprehensive metabolic panel   Lipid panel   Other Visit Diagnoses     Bronchitis    -  Primary   Relevant Medications   azithromycin (ZITHROMAX Z-PAK) 250 MG tablet   promethazine-dextromethorphan (PROMETHAZINE-DM)  6.25-15 MG/5ML syrup   Acute non-recurrent pansinusitis       Relevant Medications   azithromycin (ZITHROMAX Z-PAK) 250 MG tablet   promethazine-dextromethorphan (PROMETHAZINE-DM) 6.25-15 MG/5ML syrup   fluticasone (FLONASE) 50 MCG/ACT nasal spray       Meds ordered this encounter  Medications   lisinopril (ZESTRIL) 10 MG tablet    Sig: TAKE 1 TABLET BY MOUTH ONCE DAILY .    Dispense:  90 tablet    Refill:  1   atorvastatin (LIPITOR) 10 MG tablet    Sig: Take 1 tablet (10 mg total) by mouth daily.    Dispense:  90 tablet    Refill:  1   azithromycin (ZITHROMAX Z-PAK) 250 MG tablet    Sig: As directed    Dispense:  6 each    Refill:  0   promethazine-dextromethorphan (PROMETHAZINE-DM) 6.25-15 MG/5ML syrup    Sig: Take 5 mLs by mouth 4 (four) times daily as needed.    Dispense:  118 mL    Refill:  0   fluticasone (FLONASE) 50 MCG/ACT nasal spray    Sig: Place 2 sprays into both nostrils daily.    Dispense:  16 g    Refill:  6     I,[name],acting as a scribe for [employer], DO.,have documented all relevant documentation on the behalf of [name], DO,as directed by  [name], DO while in the presence of [name], DO.   I, [name], DO., personally preformed the services described in this documentation.  All medical record entries made by the scribe were at my direction and in my presence.  I have reviewed the chart and discharge instructions (if applicable) and agree that the record reflects my personal performance and is accurate and complete. 08/17/2021

## 2021-08-17 NOTE — Patient Instructions (Signed)

## 2021-08-17 NOTE — Assessment & Plan Note (Signed)
abx per orders  Cough med F/u next week if no better

## 2021-08-17 NOTE — Assessment & Plan Note (Signed)
Well controlled, no changes to meds. Encouraged heart healthy diet such as the DASH diet and exercise as tolerated.  °

## 2021-08-17 NOTE — Assessment & Plan Note (Signed)
Encourage heart healthy diet such as MIND or DASH diet, increase exercise, avoid trans fats, simple carbohydrates and processed foods, consider a krill or fish or flaxseed oil cap daily.  °

## 2021-08-18 LAB — COMPREHENSIVE METABOLIC PANEL
AG Ratio: 2 (calc) (ref 1.0–2.5)
ALT: 18 U/L (ref 6–29)
AST: 21 U/L (ref 10–35)
Albumin: 3.9 g/dL (ref 3.6–5.1)
Alkaline phosphatase (APISO): 64 U/L (ref 37–153)
BUN: 18 mg/dL (ref 7–25)
CO2: 28 mmol/L (ref 20–32)
Calcium: 9.3 mg/dL (ref 8.6–10.4)
Chloride: 107 mmol/L (ref 98–110)
Creat: 0.98 mg/dL (ref 0.60–1.00)
Globulin: 2 g/dL (calc) (ref 1.9–3.7)
Glucose, Bld: 97 mg/dL (ref 65–99)
Potassium: 4.3 mmol/L (ref 3.5–5.3)
Sodium: 143 mmol/L (ref 135–146)
Total Bilirubin: 0.7 mg/dL (ref 0.2–1.2)
Total Protein: 5.9 g/dL — ABNORMAL LOW (ref 6.1–8.1)

## 2021-08-18 LAB — CBC WITH DIFFERENTIAL/PLATELET
Absolute Monocytes: 421 cells/uL (ref 200–950)
Basophils Absolute: 62 cells/uL (ref 0–200)
Basophils Relative: 0.8 %
Eosinophils Absolute: 179 cells/uL (ref 15–500)
Eosinophils Relative: 2.3 %
HCT: 44.7 % (ref 35.0–45.0)
Hemoglobin: 14.8 g/dL (ref 11.7–15.5)
Lymphs Abs: 2395 cells/uL (ref 850–3900)
MCH: 29.3 pg (ref 27.0–33.0)
MCHC: 33.1 g/dL (ref 32.0–36.0)
MCV: 88.5 fL (ref 80.0–100.0)
MPV: 10.2 fL (ref 7.5–12.5)
Monocytes Relative: 5.4 %
Neutro Abs: 4742 cells/uL (ref 1500–7800)
Neutrophils Relative %: 60.8 %
Platelets: 242 10*3/uL (ref 140–400)
RBC: 5.05 10*6/uL (ref 3.80–5.10)
RDW: 13.3 % (ref 11.0–15.0)
Total Lymphocyte: 30.7 %
WBC: 7.8 10*3/uL (ref 3.8–10.8)

## 2021-08-18 LAB — LIPID PANEL
Cholesterol: 139 mg/dL (ref <200)
HDL: 46 mg/dL — ABNORMAL LOW (ref >=50)
LDL Cholesterol (Calc): 66 mg/dL (calc)
Non-HDL Cholesterol (Calc): 93 mg/dL (calc) (ref <130)
Total CHOL/HDL Ratio: 3 (calc) (ref <5.0)
Triglycerides: 202 mg/dL — ABNORMAL HIGH (ref <150)

## 2021-10-25 DIAGNOSIS — H3582 Retinal ischemia: Secondary | ICD-10-CM | POA: Diagnosis not present

## 2021-10-25 DIAGNOSIS — H35033 Hypertensive retinopathy, bilateral: Secondary | ICD-10-CM | POA: Diagnosis not present

## 2021-10-25 DIAGNOSIS — H34832 Tributary (branch) retinal vein occlusion, left eye, with macular edema: Secondary | ICD-10-CM | POA: Diagnosis not present

## 2021-10-25 DIAGNOSIS — H43811 Vitreous degeneration, right eye: Secondary | ICD-10-CM | POA: Diagnosis not present

## 2021-11-27 ENCOUNTER — Telehealth: Payer: Self-pay | Admitting: Family Medicine

## 2021-11-27 NOTE — Telephone Encounter (Signed)
Spoke with patient. Pt advised she is not having any sxs of high blood pressure. Pt advised if she starts having any sxs (pt given verbal examples of sxs) she should go to the ED. I also advised patient to stay hydrated, try not to eat anything heavy in salt or grease and to relax for the night. Pt has appointment with Lovena Le in the AM.

## 2021-11-27 NOTE — Telephone Encounter (Signed)
Pt stated she wanted to be seen tomorrow for bp concerns. She was scheduled with Lovena Le. She stated at her dr. appt today, her bp was 201/104, 180/90, 204/193, and 183/100. Pt spoke with Nira Conn to go over symptoms, she stated she has none as of right now. She was advised if any symptoms come up before her appt tomorrow to go to ed immediately.

## 2021-11-28 ENCOUNTER — Encounter: Payer: Self-pay | Admitting: Family Medicine

## 2021-11-28 ENCOUNTER — Ambulatory Visit: Payer: Medicare PPO | Admitting: Family Medicine

## 2021-11-28 VITALS — BP 126/82 | HR 88 | Ht 63.0 in | Wt 151.8 lb

## 2021-11-28 DIAGNOSIS — I10 Essential (primary) hypertension: Secondary | ICD-10-CM

## 2021-11-28 NOTE — Progress Notes (Addendum)
? ?Acute Office Visit ? ?Subjective:  ? ? Patient ID: Cynthia May, female    DOB: January 22, 1951, 71 y.o.   MRN: [id number] ? ?CC: blood pressure concerns ? ? ?HPI ?Patient is in today for blood pressure concerns.  ? ?Went to an appointment yesterday [facility] - brain study) and had elevated BP (200/104, 180/90, 204/93, 180/100) - reports she was frustrated after all of the memory testing and not being able to eat for several hours. She is due for imaging with them tomorrow, but they will not proceed with BPs that high. Asymptomatic.  ? ?Today she reports she is feeling fine. Not having any symptoms - denies chest pain, palpitations, dyspnea, headaches, vision changes, edema. She is compliant with the lisinopril 10 mg daily. Has one cup of coffee every morning. No new stressors/anxiety.   ? ? ? ? ?Past Medical History:  ?Diagnosis Date  ? Asthma   ? Depression   ? Environmental allergies   ? HTN (hypertension)   ? Memory loss   ? Pancreatitis   ? Retinal vessel occlusion   ? Thyroid disease   ? ? ?Past Surgical History:  ?Procedure Laterality Date  ? CHOLECYSTECTOMY    ? EYE SURGERY Bilateral   ? Cataract sx. Dr.Beavis.  ? TONSILLECTOMY AND ADENOIDECTOMY    ? ? ?Family History  ?Problem Relation Age of Onset  ? Arthritis Mother   ? Heart disease Mother   ? Stroke Mother   ? Thyroid disease Mother   ? Alzheimer's disease Mother   ? Heart disease Father   ? Colon cancer Brother   ? Depression Sister   ? Alzheimer's disease Sister   ? Lung cancer Paternal Grandmother   ? Prostate cancer Brother   ? Heart disease Brother   ? Depression Sister   ?     5 silbling had depression from Thyroid disease   ? Arthritis Sister   ? Lung cancer Brother   ? Other Sister   ?     Creutzfeldt-Jacob Disease  ? Heart attack Brother   ? ? ?Social History  ? ?Socioeconomic History  ? Marital status: Widowed  ?  Spouse name: Not on file  ? Number of children: 3  ? Years of education: Masters  ? Highest education level: Not  on file  ?Occupational History  ? Occupation: Retired  ?Tobacco Use  ? Smoking status: Former  ? Smokeless tobacco: Never  ? Tobacco comments:  ?  Quit 1990  ?Substance and Sexual Activity  ? Alcohol use: No  ?  Alcohol/week: 0.0 standard drinks  ? Drug use: No  ? Sexual activity: Never  ?Other Topics Concern  ? Not on file  ?Social History Narrative  ? Exercise--  Pt is active during day-- no organized exercise.  ? Lives at home alone.  ? Right-handed.  ? 1-2 diet sodas per day.  ? ?Social Determinants of Health  ? ?Financial Resource Strain: Low Risk   ? Difficulty of Paying Living Expenses: Not hard at all  ?Food Insecurity: No Food Insecurity  ? Worried About Charity fundraiser in the Last Year: Never true  ? Ran Out of Food in the Last Year: Never true  ?Transportation Needs: No Transportation Needs  ? Lack of Transportation (Medical): No  ? Lack of Transportation (Non-Medical): No  ?Physical Activity: Sufficiently Active  ? Days of Exercise per Week: 7 days  ? Minutes of Exercise per Session: 30 min  ?Stress: No Stress Concern  Present  ? Feeling of Stress : Not at all  ?Social Connections: Moderately Integrated  ? Frequency of Communication with Friends and Family: More than three times a week  ? Frequency of Social Gatherings with Friends and Family: More than three times a week  ? Attends Religious Services: More than 4 times per year  ? Active Member of Clubs or Organizations: Yes  ? Attends Archivist Meetings: More than 4 times per year  ? Marital Status: Widowed  ?Intimate Partner Violence: Not At Risk  ? Fear of Current or Ex-Partner: No  ? Emotionally Abused: No  ? Physically Abused: No  ? Sexually Abused: No  ? ? ?Outpatient Medications Prior to Visit  ?Medication Sig Dispense Refill  ? alendronate (FOSAMAX) 70 MG tablet TAKE 1 TABLET BY EVERY 7 DAYS TAKE  ON  EMPTY  STOMACH  WITH  FULL  GLASS  OF  WATER 12 tablet 2  ? atorvastatin (LIPITOR) 10 MG tablet Take 1 tablet (10 mg total) by mouth  daily. 90 tablet 1  ? DULoxetine (CYMBALTA) 60 MG capsule Take 1 capsule by mouth daily.  5  ? levothyroxine (SYNTHROID) 137 MCG tablet Take by mouth.    ? lisinopril (ZESTRIL) 10 MG tablet TAKE 1 TABLET BY MOUTH ONCE DAILY . 90 tablet 1  ? traZODone (DESYREL) 50 MG tablet Take 1 nightly for sleep    ? azithromycin (ZITHROMAX Z-PAK) 250 MG tablet As directed 6 each 0  ? fluticasone (FLONASE) 50 MCG/ACT nasal spray Place 2 sprays into both nostrils daily. 16 g 6  ? promethazine-dextromethorphan (PROMETHAZINE-DM) 6.25-15 MG/5ML syrup Take 5 mLs by mouth 4 (four) times daily as needed. 118 mL 0  ? ?No facility-administered medications prior to visit.  ? ? ?Allergies  ?Allergen Reactions  ? Augmentin [Amoxicillin-Pot Clavulanate] Other (See Comments)  ?  Flu like symptoms  ? Erythromycin Other (See Comments)  ?  Flu like symptoms  ? Other   ?  Molds, trees, animal fur, grass.  ? ? ?Review of Systems ?All review of systems negative except what is listed in the HPI ? ?   ?Objective:  ?  ?Physical Exam ?Vitals reviewed.  ?Constitutional:   ?   Appearance: Normal appearance.  ?Cardiovascular:  ?   Rate and Rhythm: Normal rate and regular rhythm.  ?   Pulses: Normal pulses.  ?   Heart sounds: Normal heart sounds.  ?Pulmonary:  ?   Effort: Pulmonary effort is normal.  ?   Breath sounds: Normal breath sounds.  ?Skin: ?   General: Skin is warm and dry.  ?Neurological:  ?   General: No focal deficit present.  ?   Mental Status: She is alert and oriented to person, place, and time. Mental status is at baseline.  ?Psychiatric:     ?   Mood and Affect: Mood normal.     ?   Behavior: Behavior normal.     ?   Thought Content: Thought content normal.     ?   Judgment: Judgment normal.  ? ? ?BP 126/82   Pulse 88   Ht 5\' 3"  (1.6 m)   Wt 151 lb 12.8 oz (68.9 kg)   BMI 26.89 kg/m?  ?Wt Readings from Last 3 Encounters:  ?11/28/21 151 lb 12.8 oz (68.9 kg)  ?08/17/21 153 lb (69.4 kg)  ?05/10/21 151 lb 12.8 oz (68.9 kg)  ? ? ?Health  Maintenance Due  ?Topic Date Due  ? COLONOSCOPY (Pts 45-68yrs Insurance  coverage will need to be confirmed)  07/16/2021  ? COVID-19 Vaccine (5 - Booster for [place] series) 08/17/2021  ? ? ?There are no preventive care reminders to display for this patient. ? ? ?Lab Results  ?Component Value Date  ? TSH 4.81 (H) 03/12/2018  ? ?Lab Results  ?Component Value Date  ? WBC 7.8 08/17/2021  ? HGB 14.8 08/17/2021  ? HCT 44.7 08/17/2021  ? MCV 88.5 08/17/2021  ? PLT 242 08/17/2021  ? ?Lab Results  ?Component Value Date  ? NA 143 08/17/2021  ? K 4.3 08/17/2021  ? CO2 28 08/17/2021  ? GLUCOSE 97 08/17/2021  ? BUN 18 08/17/2021  ? CREATININE 0.98 08/17/2021  ? BILITOT 0.7 08/17/2021  ? ALKPHOS 65 02/20/2021  ? AST 21 08/17/2021  ? ALT 18 08/17/2021  ? PROT 5.9 (L) 08/17/2021  ? ALBUMIN 4.3 02/20/2021  ? CALCIUM 9.3 08/17/2021  ? GFR 68.89 02/20/2021  ? ?Lab Results  ?Component Value Date  ? CHOL 139 08/17/2021  ? ?Lab Results  ?Component Value Date  ? HDL 46 (L) 08/17/2021  ? ?Lab Results  ?Component Value Date  ? [place] 66 08/17/2021  ? ?Lab Results  ?Component Value Date  ? TRIG 202 (H) 08/17/2021  ? ?Lab Results  ?Component Value Date  ? CHOLHDL 3.0 08/17/2021  ? ?Lab Results  ?Component Value Date  ? HGBA1C 5.6 03/12/2018  ? ? ?   ?Assessment & Plan:  ? ?1. Essential hypertension ?1st reading today = 154/85 ?2nd reading (about 15 minutes later) = 126/82 ? ?No changes to your medication based on readings. Elevated readings yesterday were likely reactive to frustration, mental stimulation, hunger, dehydration. You can get a blood pressure machine to monitor occasionally at home, but otherwise stable, no changes.  ? ?Please contact office for follow-up if symptoms do not improve or worsen. Seek emergency care if symptoms become severe. ? ? ?Terrilyn Saver, NP ? ?

## 2021-11-28 NOTE — Patient Instructions (Addendum)
1st reading today = 154/85 ?2nd reading (about 15 minutes later) = 126/82 ? ?No changes to your medication readings. Elevated readings yesterday were likely reactive to frustration, mental stimulation, hunger, dehydration. You can get a blood pressure machine to monitor occasionally at home, but otherwise stable, no changes.  ? ?Please contact office for follow-up if symptoms do not improve or worsen. Seek emergency care if symptoms become severe. ? ?

## 2021-12-14 ENCOUNTER — Ambulatory Visit: Payer: Medicare PPO | Admitting: Family Medicine

## 2021-12-14 ENCOUNTER — Encounter: Payer: Self-pay | Admitting: Family Medicine

## 2021-12-14 VITALS — BP 148/100 | HR 87 | Temp 98.6°F | Resp 18 | Ht 63.0 in | Wt 150.6 lb

## 2021-12-14 DIAGNOSIS — E039 Hypothyroidism, unspecified: Secondary | ICD-10-CM

## 2021-12-14 DIAGNOSIS — I10 Essential (primary) hypertension: Secondary | ICD-10-CM

## 2021-12-14 DIAGNOSIS — E785 Hyperlipidemia, unspecified: Secondary | ICD-10-CM | POA: Diagnosis not present

## 2021-12-14 LAB — LIPID PANEL
Cholesterol: 136 mg/dL (ref 0–200)
HDL: 55.6 mg/dL (ref >39.00)
LDL Cholesterol: 61 mg/dL (ref 0–99)
NonHDL: 80.18
Total CHOL/HDL Ratio: 2
Triglycerides: 94 mg/dL (ref 0.0–149.0)
VLDL: 18.8 mg/dL (ref 0.0–40.0)

## 2021-12-14 LAB — CBC WITH DIFFERENTIAL/PLATELET
Basophils Absolute: 0.1 10*3/uL (ref 0.0–0.1)
Basophils Relative: 0.9 % (ref 0.0–3.0)
Eosinophils Absolute: 0.3 10*3/uL (ref 0.0–0.7)
Eosinophils Relative: 5.4 % — ABNORMAL HIGH (ref 0.0–5.0)
HCT: 45.1 % (ref 36.0–46.0)
Hemoglobin: 15 g/dL (ref 12.0–15.0)
Lymphocytes Relative: 25.7 % (ref 12.0–46.0)
Lymphs Abs: 1.5 10*3/uL (ref 0.7–4.0)
MCHC: 33.2 g/dL (ref 30.0–36.0)
MCV: 88.9 fl (ref 78.0–100.0)
Monocytes Absolute: 0.4 10*3/uL (ref 0.1–1.0)
Monocytes Relative: 6.6 % (ref 3.0–12.0)
Neutro Abs: 3.7 10*3/uL (ref 1.4–7.7)
Neutrophils Relative %: 61.4 % (ref 43.0–77.0)
Platelets: 215 10*3/uL (ref 150.0–400.0)
RBC: 5.07 Mil/uL (ref 3.87–5.11)
RDW: 14 % (ref 11.5–15.5)
WBC: 6 10*3/uL (ref 4.0–10.5)

## 2021-12-14 LAB — COMPREHENSIVE METABOLIC PANEL
ALT: 14 U/L (ref 0–35)
AST: 20 U/L (ref 0–37)
Albumin: 4.2 g/dL (ref 3.5–5.2)
Alkaline Phosphatase: 61 U/L (ref 39–117)
BUN: 18 mg/dL (ref 6–23)
CO2: 33 mEq/L — ABNORMAL HIGH (ref 19–32)
Calcium: 9.8 mg/dL (ref 8.4–10.5)
Chloride: 105 mEq/L (ref 96–112)
Creatinine, Ser: 0.82 mg/dL (ref 0.40–1.20)
GFR: 71.52 mL/min (ref >60.00)
Glucose, Bld: 79 mg/dL (ref 70–99)
Potassium: 4.4 mEq/L (ref 3.5–5.1)
Sodium: 143 mEq/L (ref 135–145)
Total Bilirubin: 1.1 mg/dL (ref 0.2–1.2)
Total Protein: 6.3 g/dL (ref 6.0–8.3)

## 2021-12-14 LAB — TSH: TSH: 0.14 u[IU]/mL — ABNORMAL LOW (ref 0.35–5.50)

## 2021-12-14 MED ORDER — LISINOPRIL 20 MG PO TABS: 20.0000 mg | ORAL_TABLET | Freq: Every day | ORAL | 3 refills | Status: DC

## 2021-12-14 MED ORDER — LISINOPRIL 10 MG PO TABS: 10.0000 mg | ORAL_TABLET | Freq: Every day | ORAL | 3 refills | Status: DC

## 2021-12-14 NOTE — Assessment & Plan Note (Signed)
Encourage heart healthy diet such as MIND or DASH diet, increase exercise, avoid trans fats, simple carbohydrates and processed foods, consider a krill or fish or flaxseed oil cap daily.  °

## 2021-12-14 NOTE — Patient Instructions (Signed)

## 2021-12-14 NOTE — Progress Notes (Signed)
? ?Subjective:  ? ?By signing my name below, I, [name], attest that this documentation has been prepared under the direction and in the presence of [name], DO. 12/14/2021 ? ? Patient ID: Cynthia May, female    DOB: 03-17-1951, 71 y.o.   MRN: [id number] ? ?Chief Complaint  ?Patient presents with  ? Hypertension  ? Follow-up  ? ? ?HPI ?Patient is in today for an office visit. ? ?She reports that the high blood pressure readings started when she went to do a brain reading at [facility] on 02/20.  She came in for a visit and was told to get a blood pressure cuff and record her readings. The cuff she purchased has been giving her high readings. Her blood pressure is elevated at this visit. She reports occasional headaches and lightheadedness. ?BP Readings from Last 3 Encounters:  ?12/14/21 (!) 148/100  ?11/28/21 126/82  ?08/17/21 132/90  ?  ?She is watching her salt intake. She ate pork once last week.  ? ?Past Medical History:  ?Diagnosis Date  ? Asthma   ? Depression   ? Environmental allergies   ? HTN (hypertension)   ? Memory loss   ? Pancreatitis   ? Retinal vessel occlusion   ? Thyroid disease   ? ? ?Past Surgical History:  ?Procedure Laterality Date  ? CHOLECYSTECTOMY    ? EYE SURGERY Bilateral   ? Cataract sx. Dr.Beavis.  ? TONSILLECTOMY AND ADENOIDECTOMY    ? ? ?Family History  ?Problem Relation Age of Onset  ? Arthritis Mother   ? Heart disease Mother   ? Stroke Mother   ? Thyroid disease Mother   ? Alzheimer's disease Mother   ? Heart disease Father   ? Colon cancer Brother   ? Depression Sister   ? Alzheimer's disease Sister   ? Lung cancer Paternal Grandmother   ? Prostate cancer Brother   ? Heart disease Brother   ? Depression Sister   ?     5 silbling had depression from Thyroid disease   ? Arthritis Sister   ? Lung cancer Brother   ? Other Sister   ?     Creutzfeldt-Jacob Disease  ? Heart attack Brother   ? ? ?Social History  ? ?Socioeconomic History  ? Marital status: Widowed   ?  Spouse name: Not on file  ? Number of children: 3  ? Years of education: Masters  ? Highest education level: Not on file  ?Occupational History  ? Occupation: Retired  ?Tobacco Use  ? Smoking status: Former  ? Smokeless tobacco: Never  ? Tobacco comments:  ?  Quit 1990  ?Substance and Sexual Activity  ? Alcohol use: No  ?  Alcohol/week: 0.0 standard drinks  ? Drug use: No  ? Sexual activity: Never  ?Other Topics Concern  ? Not on file  ?Social History Narrative  ? Exercise--  Pt is active during day-- no organized exercise.  ? Lives at home alone.  ? Right-handed.  ? 1-2 diet sodas per day.  ? ?Social Determinants of Health  ? ?Financial Resource Strain: Low Risk   ? Difficulty of Paying Living Expenses: Not hard at all  ?Food Insecurity: No Food Insecurity  ? Worried About Charity fundraiser in the Last Year: Never true  ? Ran Out of Food in the Last Year: Never true  ?Transportation Needs: No Transportation Needs  ? Lack of Transportation (Medical): No  ? Lack of Transportation (Non-Medical): No  ?  Physical Activity: Sufficiently Active  ? Days of Exercise per Week: 7 days  ? Minutes of Exercise per Session: 30 min  ?Stress: No Stress Concern Present  ? Feeling of Stress : Not at all  ?Social Connections: Moderately Integrated  ? Frequency of Communication with Friends and Family: More than three times a week  ? Frequency of Social Gatherings with Friends and Family: More than three times a week  ? Attends Religious Services: More than 4 times per year  ? Active Member of Clubs or Organizations: Yes  ? Attends Archivist Meetings: More than 4 times per year  ? Marital Status: Widowed  ?Intimate Partner Violence: Not At Risk  ? Fear of Current or Ex-Partner: No  ? Emotionally Abused: No  ? Physically Abused: No  ? Sexually Abused: No  ? ? ?Outpatient Medications Prior to Visit  ?Medication Sig Dispense Refill  ? alendronate (FOSAMAX) 70 MG tablet TAKE 1 TABLET BY EVERY 7 DAYS TAKE  ON  EMPTY  STOMACH   WITH  FULL  GLASS  OF  WATER 12 tablet 2  ? atorvastatin (LIPITOR) 10 MG tablet Take 1 tablet (10 mg total) by mouth daily. 90 tablet 1  ? DULoxetine (CYMBALTA) 60 MG capsule Take 1 capsule by mouth daily.  5  ? levothyroxine (SYNTHROID) 137 MCG tablet Take by mouth.    ? traZODone (DESYREL) 50 MG tablet Take 1 nightly for sleep    ? lisinopril (ZESTRIL) 10 MG tablet TAKE 1 TABLET BY MOUTH ONCE DAILY . 90 tablet 1  ? ?No facility-administered medications prior to visit.  ? ? ?Allergies  ?Allergen Reactions  ? Augmentin [Amoxicillin-Pot Clavulanate] Other (See Comments)  ?  Flu like symptoms  ? Erythromycin Other (See Comments)  ?  Flu like symptoms  ? Other   ?  Molds, trees, animal fur, grass.  ? ? ?Review of Systems  ?Constitutional:  Negative for fever.  ?HENT:  Negative for congestion, ear pain, hearing loss, sinus pain and sore throat.   ?Eyes:  Negative for blurred vision and pain.  ?Respiratory:  Negative for cough, sputum production, shortness of breath and wheezing.   ?Cardiovascular:  Negative for chest pain and palpitations.  ?Gastrointestinal:  Negative for blood in stool, constipation, diarrhea, nausea and vomiting.  ?Genitourinary:  Negative for dysuria, frequency, hematuria and urgency.  ?Musculoskeletal:  Negative for back pain, falls and myalgias.  ?Neurological:  Positive for headaches. Negative for dizziness, sensory change, loss of consciousness and weakness.  ?     (+) lightheadedness  ?Endo/Heme/Allergies:  Negative for environmental allergies. Does not bruise/bleed easily.  ?Psychiatric/Behavioral:  Negative for depression and suicidal ideas. The patient is not nervous/anxious and does not have insomnia.   ? ?   ?Objective:  ?  ?Physical Exam ?Constitutional:   ?   General: She is not in acute distress. ?   Appearance: Normal appearance. She is not ill-appearing.  ?HENT:  ?   Head: Normocephalic and atraumatic.  ?   Right Ear: External ear normal.  ?   Left Ear: External ear normal.  ?Eyes:  ?    Extraocular Movements: Extraocular movements intact.  ?   Pupils: Pupils are equal, round, and reactive to light.  ?Cardiovascular:  ?   Rate and Rhythm: Normal rate and regular rhythm.  ?   Pulses: Normal pulses.  ?   Heart sounds: Normal heart sounds. No murmur heard. ?  No gallop.  ?Pulmonary:  ?   Effort: Pulmonary  effort is normal. No respiratory distress.  ?   Breath sounds: Normal breath sounds. No wheezing, rhonchi or rales.  ?Abdominal:  ?   General: Bowel sounds are normal. There is no distension.  ?   Palpations: Abdomen is soft. There is no mass.  ?   Tenderness: There is no abdominal tenderness. There is no guarding or rebound.  ?   Hernia: No hernia is present.  ?Musculoskeletal:  ?   Cervical back: Normal range of motion and neck supple.  ?Lymphadenopathy:  ?   Cervical: No cervical adenopathy.  ?Skin: ?   General: Skin is warm and dry.  ?Neurological:  ?   Mental Status: She is alert and oriented to person, place, and time.  ?Psychiatric:     ?   Behavior: Behavior normal.  ? ? ?BP (!) 148/100 (BP Location: Left Arm, Patient Position: Sitting, Cuff Size: Normal)   Pulse 87   Temp 98.6 ?F (37 ?C) (Oral)   Resp 18   Ht '5\' 3"'$  (1.6 m)   Wt 150 lb 9.6 oz (68.3 kg)   SpO2 97%   BMI 26.68 kg/m?  ?Wt Readings from Last 3 Encounters:  ?12/14/21 150 lb 9.6 oz (68.3 kg)  ?11/28/21 151 lb 12.8 oz (68.9 kg)  ?08/17/21 153 lb (69.4 kg)  ? ? ?Diabetic Foot Exam - Simple   ?No data filed ?  ? ?Lab Results  ?Component Value Date  ? WBC 7.8 08/17/2021  ? HGB 14.8 08/17/2021  ? HCT 44.7 08/17/2021  ? PLT 242 08/17/2021  ? GLUCOSE 97 08/17/2021  ? CHOL 139 08/17/2021  ? TRIG 202 (H) 08/17/2021  ? HDL 46 (L) 08/17/2021  ? [place] 66 08/17/2021  ? ALT 18 08/17/2021  ? AST 21 08/17/2021  ? NA 143 08/17/2021  ? K 4.3 08/17/2021  ? CL 107 08/17/2021  ? CREATININE 0.98 08/17/2021  ? BUN 18 08/17/2021  ? CO2 28 08/17/2021  ? TSH 4.81 (H) 03/12/2018  ? HGBA1C 5.6 03/12/2018  ? ? ?Lab Results  ?Component Value Date  ?  TSH 4.81 (H) 03/12/2018  ? ?Lab Results  ?Component Value Date  ? WBC 7.8 08/17/2021  ? HGB 14.8 08/17/2021  ? HCT 44.7 08/17/2021  ? MCV 88.5 08/17/2021  ? PLT 242 08/17/2021  ? ?Lab Results  ?Component Va

## 2021-12-14 NOTE — Assessment & Plan Note (Signed)
Poorly controlled will alter medications, encouraged DASH diet, minimize caffeine and obtain adequate sleep. Report concerning symptoms and follow up as directed and as needed 

## 2021-12-21 ENCOUNTER — Other Ambulatory Visit: Payer: Self-pay | Admitting: Family Medicine

## 2021-12-21 DIAGNOSIS — E039 Hypothyroidism, unspecified: Secondary | ICD-10-CM

## 2022-01-07 DIAGNOSIS — H43393 Other vitreous opacities, bilateral: Secondary | ICD-10-CM | POA: Diagnosis not present

## 2022-01-07 DIAGNOSIS — H43811 Vitreous degeneration, right eye: Secondary | ICD-10-CM | POA: Diagnosis not present

## 2022-01-07 DIAGNOSIS — H35033 Hypertensive retinopathy, bilateral: Secondary | ICD-10-CM | POA: Diagnosis not present

## 2022-01-07 DIAGNOSIS — H34832 Tributary (branch) retinal vein occlusion, left eye, with macular edema: Secondary | ICD-10-CM | POA: Diagnosis not present

## 2022-01-08 ENCOUNTER — Ambulatory Visit: Payer: Medicare PPO | Admitting: Family Medicine

## 2022-01-08 ENCOUNTER — Encounter: Payer: Self-pay | Admitting: Family Medicine

## 2022-01-08 VITALS — BP 164/100 | HR 84 | Temp 97.8°F | Resp 18 | Ht 63.0 in | Wt 153.6 lb

## 2022-01-08 DIAGNOSIS — I10 Essential (primary) hypertension: Secondary | ICD-10-CM

## 2022-01-08 DIAGNOSIS — E039 Hypothyroidism, unspecified: Secondary | ICD-10-CM | POA: Diagnosis not present

## 2022-01-08 MED ORDER — LISINOPRIL 20 MG PO TABS: ORAL_TABLET | ORAL | 3 refills | Status: DC

## 2022-01-08 NOTE — Patient Instructions (Signed)

## 2022-01-08 NOTE — Progress Notes (Signed)
? ?Established Patient Office Visit ? ?Subjective:  ?Patient ID: Cynthia May, female    DOB: 1951/06/10  Age: 71 y.o. MRN: [id number] ? ?CC:  ?Chief Complaint  ?Patient presents with  ? Hypertension  ?  Pt states checking blood pressures at home and have been around 140/80-90  ? Follow-up  ? ? ?HPI ?Cynthia May presents for f/u bp     no complaints ? ?Past Medical History:  ?Diagnosis Date  ? Asthma   ? Depression   ? Environmental allergies   ? HTN (hypertension)   ? Memory loss   ? Pancreatitis   ? Retinal vessel occlusion   ? Thyroid disease   ? ? ?Past Surgical History:  ?Procedure Laterality Date  ? CHOLECYSTECTOMY    ? EYE SURGERY Bilateral   ? Cataract sx. Dr.Beavis.  ? TONSILLECTOMY AND ADENOIDECTOMY    ? ? ?Family History  ?Problem Relation Age of Onset  ? Arthritis Mother   ? Heart disease Mother   ? Stroke Mother   ? Thyroid disease Mother   ? Alzheimer's disease Mother   ? Heart disease Father   ? Colon cancer Brother   ? Depression Sister   ? Alzheimer's disease Sister   ? Lung cancer Paternal Grandmother   ? Prostate cancer Brother   ? Heart disease Brother   ? Depression Sister   ?     5 silbling had depression from Thyroid disease   ? Arthritis Sister   ? Lung cancer Brother   ? Other Sister   ?     Creutzfeldt-Jacob Disease  ? Heart attack Brother   ? ? ?Social History  ? ?Socioeconomic History  ? Marital status: Widowed  ?  Spouse name: Not on file  ? Number of children: 3  ? Years of education: Masters  ? Highest education level: Not on file  ?Occupational History  ? Occupation: Retired  ?Tobacco Use  ? Smoking status: Former  ? Smokeless tobacco: Never  ? Tobacco comments:  ?  Quit 1990  ?Substance and Sexual Activity  ? Alcohol use: No  ?  Alcohol/week: 0.0 standard drinks  ? Drug use: No  ? Sexual activity: Never  ?Other Topics Concern  ? Not on file  ?Social History Narrative  ? Exercise--  Pt is active during day-- no organized exercise.  ? Lives at home alone.  ?  Right-handed.  ? 1-2 diet sodas per day.  ? ?Social Determinants of Health  ? ?Financial Resource Strain: Low Risk   ? Difficulty of Paying Living Expenses: Not hard at all  ?Food Insecurity: No Food Insecurity  ? Worried About Charity fundraiser in the Last Year: Never true  ? Ran Out of Food in the Last Year: Never true  ?Transportation Needs: No Transportation Needs  ? Lack of Transportation (Medical): No  ? Lack of Transportation (Non-Medical): No  ?Physical Activity: Sufficiently Active  ? Days of Exercise per Week: 7 days  ? Minutes of Exercise per Session: 30 min  ?Stress: No Stress Concern Present  ? Feeling of Stress : Not at all  ?Social Connections: Moderately Integrated  ? Frequency of Communication with Friends and Family: More than three times a week  ? Frequency of Social Gatherings with Friends and Family: More than three times a week  ? Attends Religious Services: More than 4 times per year  ? Active Member of Clubs or Organizations: Yes  ? Attends Archivist Meetings: More than 4 times  per year  ? Marital Status: Widowed  ?Intimate Partner Violence: Not At Risk  ? Fear of Current or Ex-Partner: No  ? Emotionally Abused: No  ? Physically Abused: No  ? Sexually Abused: No  ? ? ?Outpatient Medications Prior to Visit  ?Medication Sig Dispense Refill  ? alendronate (FOSAMAX) 70 MG tablet TAKE 1 TABLET BY EVERY 7 DAYS TAKE  ON  EMPTY  STOMACH  WITH  FULL  GLASS  OF  WATER 12 tablet 2  ? atorvastatin (LIPITOR) 10 MG tablet Take 1 tablet (10 mg total) by mouth daily. 90 tablet 1  ? DULoxetine (CYMBALTA) 60 MG capsule Take 1 capsule by mouth daily.  5  ? levothyroxine (SYNTHROID) 125 MCG tablet Take 1 tablet (125 mcg total) by mouth daily. 90 tablet 3  ? lisinopril (ZESTRIL) 20 MG tablet Take 1 tablet (20 mg total) by mouth daily. 90 tablet 3  ? traZODone (DESYREL) 50 MG tablet Take 1 nightly for sleep    ? levothyroxine (SYNTHROID) 137 MCG tablet Take by mouth.    ? ?No facility-administered  medications prior to visit.  ? ? ?Allergies  ?Allergen Reactions  ? Augmentin [Amoxicillin-Pot Clavulanate] Other (See Comments)  ?  Flu like symptoms  ? Erythromycin Other (See Comments)  ?  Flu like symptoms  ? Other   ?  Molds, trees, animal fur, grass.  ? ? ?ROS ?Review of Systems  ?Constitutional:  Negative for appetite change, diaphoresis, fatigue and unexpected weight change.  ?Eyes:  Negative for pain, redness and visual disturbance.  ?Respiratory:  Negative for cough, chest tightness, shortness of breath and wheezing.   ?Cardiovascular:  Negative for chest pain, palpitations and leg swelling.  ?Endocrine: Negative for cold intolerance, heat intolerance, polydipsia, polyphagia and polyuria.  ?Genitourinary:  Negative for difficulty urinating, dysuria and frequency.  ?Neurological:  Negative for dizziness, light-headedness, numbness and headaches.  ? ?  ?Objective:  ?  ?Physical Exam ?Vitals and nursing note reviewed.  ?Constitutional:   ?   Appearance: She is well-developed.  ?HENT:  ?   Head: Normocephalic and atraumatic.  ?Eyes:  ?   Conjunctiva/sclera: Conjunctivae normal.  ?Neck:  ?   Thyroid: No thyromegaly.  ?   Vascular: No carotid bruit or JVD.  ?Cardiovascular:  ?   Rate and Rhythm: Normal rate and regular rhythm.  ?   Heart sounds: Normal heart sounds. No murmur heard. ?Pulmonary:  ?   Effort: Pulmonary effort is normal. No respiratory distress.  ?   Breath sounds: Normal breath sounds. No wheezing or rales.  ?Chest:  ?   Chest wall: No tenderness.  ?Musculoskeletal:     ?   General: No swelling.  ?   Cervical back: Normal range of motion and neck supple.  ?   Right lower leg: No edema.  ?   Left lower leg: No edema.  ?Neurological:  ?   Mental Status: She is alert and oriented to person, place, and time.  ?Psychiatric:     ?   Mood and Affect: Mood normal.     ?   Behavior: Behavior normal.     ?   Thought Content: Thought content normal.     ?   Judgment: Judgment normal.  ? ?BP (!) 164/100 (BP  Location: Left Arm, Patient Position: Sitting, Cuff Size: Normal)   Pulse 84   Temp 97.8 ?F (36.6 ?C) (Oral)   Resp 18   Ht '5\' 3"'$  (1.6 m)   Wt 153 lb  9.6 oz (69.7 kg)   SpO2 98%   BMI 27.21 kg/m?  ?Wt Readings from Last 3 Encounters:  ?01/08/22 153 lb 9.6 oz (69.7 kg)  ?12/14/21 150 lb 9.6 oz (68.3 kg)  ?11/28/21 151 lb 12.8 oz (68.9 kg)  ? ? ? ?Health Maintenance Due  ?Topic Date Due  ? COLONOSCOPY (Pts 45-21yr Insurance coverage will need to be confirmed)  07/16/2021  ? COVID-19 Vaccine (5 - Booster for [place]) 08/17/2021  ? ? ?There are no preventive care reminders to display for this patient. ? ?Lab Results  ?Component Value Date  ? TSH 0.14 (L) 12/14/2021  ? ?Lab Results  ?Component Value Date  ? WBC 6.0 12/14/2021  ? HGB 15.0 12/14/2021  ? HCT 45.1 12/14/2021  ? MCV 88.9 12/14/2021  ? PLT 215.0 12/14/2021  ? ?Lab Results  ?Component Value Date  ? NA 143 12/14/2021  ? K 4.4 12/14/2021  ? CO2 33 (H) 12/14/2021  ? GLUCOSE 79 12/14/2021  ? BUN 18 12/14/2021  ? CREATININE 0.82 12/14/2021  ? BILITOT 1.1 12/14/2021  ? ALKPHOS 61 12/14/2021  ? AST 20 12/14/2021  ? ALT 14 12/14/2021  ? PROT 6.3 12/14/2021  ? ALBUMIN 4.2 12/14/2021  ? CALCIUM 9.8 12/14/2021  ? GFR 71.52 12/14/2021  ? ?Lab Results  ?Component Value Date  ? CHOL 136 12/14/2021  ? ?Lab Results  ?Component Value Date  ? HDL 55.60 12/14/2021  ? ?Lab Results  ?Component Value Date  ? [place]61 12/14/2021  ? ?Lab Results  ?Component Value Date  ? TRIG 94.0 12/14/2021  ? ?Lab Results  ?Component Value Date  ? CHOLHDL 2 12/14/2021  ? ?Lab Results  ?Component Value Date  ? HGBA1C 5.6 03/12/2018  ? ? ?  ?Assessment & Plan:  ? ?Problem List Items Addressed This Visit   ? ?  ? Unprioritized  ? Hypothyroidism  ? Relevant Medications  ? levothyroxine (SYNTHROID) 125 MCG tablet  ? Essential hypertension  ?  Poorly controlled will alter medications, encouraged DASH diet, minimize caffeine and obtain adequate sleep. Report concerning symptoms and follow  up as directed and as needed ?Inc lisinopril 20 mg 1 1/2 tab qd  ? ?  ?  ? Relevant Medications  ? lisinopril (ZESTRIL) 20 MG tablet  ? ?Other Visit Diagnoses   ? ? Primary hypertension    -  Primary  ? Relevant Medications

## 2022-01-08 NOTE — Assessment & Plan Note (Addendum)
Poorly controlled will alter medications, encouraged DASH diet, minimize caffeine and obtain adequate sleep. Report concerning symptoms and follow up as directed and as needed ?Inc lisinopril 20 mg 1 1/2 tab qd  ? ?

## 2022-01-18 DIAGNOSIS — F5101 Primary insomnia: Secondary | ICD-10-CM | POA: Diagnosis not present

## 2022-01-18 DIAGNOSIS — F330 Major depressive disorder, recurrent, mild: Secondary | ICD-10-CM | POA: Diagnosis not present

## 2022-01-21 ENCOUNTER — Other Ambulatory Visit: Payer: Self-pay | Admitting: Family Medicine

## 2022-01-21 ENCOUNTER — Telehealth: Payer: Self-pay | Admitting: Family Medicine

## 2022-01-21 DIAGNOSIS — I10 Essential (primary) hypertension: Secondary | ICD-10-CM

## 2022-01-21 MED ORDER — LISINOPRIL 40 MG PO TABS: 40.0000 mg | ORAL_TABLET | Freq: Every day | ORAL | 3 refills | Status: DC

## 2022-01-21 NOTE — Telephone Encounter (Signed)
Pt last seen on 04/11 and you increased her lisinopril. Please advise ?

## 2022-01-21 NOTE — Telephone Encounter (Signed)
Pt called stating that her blood pressure is still high and wanted to talk to Dr. Etter Sjogren about upping her blood pressure medication in order to combat it. Last BP reading today was 181/127. Please Advise. ?

## 2022-01-22 NOTE — Telephone Encounter (Signed)
Pt advised of providers note.  ?

## 2022-01-22 NOTE — Telephone Encounter (Signed)
Pt called. LVM to return call 

## 2022-02-18 ENCOUNTER — Encounter: Payer: Medicare PPO | Admitting: Family Medicine

## 2022-02-22 ENCOUNTER — Ambulatory Visit (INDEPENDENT_AMBULATORY_CARE_PROVIDER_SITE_OTHER): Payer: Medicare PPO | Admitting: Family Medicine

## 2022-02-22 ENCOUNTER — Encounter: Payer: Self-pay | Admitting: Family Medicine

## 2022-02-22 VITALS — BP 132/88 | HR 88 | Temp 98.0°F | Resp 18 | Ht 63.0 in | Wt 152.6 lb

## 2022-02-22 DIAGNOSIS — I10 Essential (primary) hypertension: Secondary | ICD-10-CM

## 2022-02-22 DIAGNOSIS — E785 Hyperlipidemia, unspecified: Secondary | ICD-10-CM | POA: Diagnosis not present

## 2022-02-22 DIAGNOSIS — E039 Hypothyroidism, unspecified: Secondary | ICD-10-CM

## 2022-02-22 DIAGNOSIS — E2839 Other primary ovarian failure: Secondary | ICD-10-CM

## 2022-02-22 DIAGNOSIS — Z0000 Encounter for general adult medical examination without abnormal findings: Secondary | ICD-10-CM | POA: Diagnosis not present

## 2022-02-22 LAB — COMPREHENSIVE METABOLIC PANEL
ALT: 17 U/L (ref 0–35)
AST: 23 U/L (ref 0–37)
Albumin: 4.3 g/dL (ref 3.5–5.2)
Alkaline Phosphatase: 64 U/L (ref 39–117)
BUN: 17 mg/dL (ref 6–23)
CO2: 29 mEq/L (ref 19–32)
Calcium: 9.6 mg/dL (ref 8.4–10.5)
Chloride: 106 mEq/L (ref 96–112)
Creatinine, Ser: 0.95 mg/dL (ref 0.40–1.20)
GFR: 59.86 mL/min — ABNORMAL LOW (ref >60.00)
Glucose, Bld: 95 mg/dL (ref 70–99)
Potassium: 4.6 mEq/L (ref 3.5–5.1)
Sodium: 143 mEq/L (ref 135–145)
Total Bilirubin: 1.5 mg/dL — ABNORMAL HIGH (ref 0.2–1.2)
Total Protein: 6.4 g/dL (ref 6.0–8.3)

## 2022-02-22 LAB — LIPID PANEL
Cholesterol: 164 mg/dL (ref 0–200)
HDL: 62 mg/dL (ref >39.00)
LDL Cholesterol: 87 mg/dL (ref 0–99)
NonHDL: 102.11
Total CHOL/HDL Ratio: 3
Triglycerides: 76 mg/dL (ref 0.0–149.0)
VLDL: 15.2 mg/dL (ref 0.0–40.0)

## 2022-02-22 LAB — CBC WITH DIFFERENTIAL/PLATELET
Basophils Absolute: 0 10*3/uL (ref 0.0–0.1)
Basophils Relative: 0.8 % (ref 0.0–3.0)
Eosinophils Absolute: 0.2 10*3/uL (ref 0.0–0.7)
Eosinophils Relative: 4.5 % (ref 0.0–5.0)
HCT: 45.9 % (ref 36.0–46.0)
Hemoglobin: 15 g/dL (ref 12.0–15.0)
Lymphocytes Relative: 30.1 % (ref 12.0–46.0)
Lymphs Abs: 1.5 10*3/uL (ref 0.7–4.0)
MCHC: 32.8 g/dL (ref 30.0–36.0)
MCV: 89.5 fl (ref 78.0–100.0)
Monocytes Absolute: 0.3 10*3/uL (ref 0.1–1.0)
Monocytes Relative: 6.2 % (ref 3.0–12.0)
Neutro Abs: 3 10*3/uL (ref 1.4–7.7)
Neutrophils Relative %: 58.4 % (ref 43.0–77.0)
Platelets: 199 10*3/uL (ref 150.0–400.0)
RBC: 5.13 Mil/uL — ABNORMAL HIGH (ref 3.87–5.11)
RDW: 14 % (ref 11.5–15.5)
WBC: 5.1 10*3/uL (ref 4.0–10.5)

## 2022-02-22 LAB — TSH: TSH: 1.2 u[IU]/mL (ref 0.35–5.50)

## 2022-02-22 NOTE — Assessment & Plan Note (Signed)
bmd due

## 2022-02-22 NOTE — Assessment & Plan Note (Signed)
con't synthroid Check labs today  stable

## 2022-02-22 NOTE — Patient Instructions (Signed)
Preventive Care 65 Years and Older, Female Preventive care refers to lifestyle choices and visits with your health care provider that can promote health and wellness. Preventive care visits are also called wellness exams. What can I expect for my preventive care visit? Counseling Your health care provider may ask you questions about your: Medical history, including: Past medical problems. Family medical history. Pregnancy and menstrual history. History of falls. Current health, including: Memory and ability to understand (cognition). Emotional well-being. Home life and relationship well-being. Sexual activity and sexual health. Lifestyle, including: Alcohol, nicotine or tobacco, and drug use. Access to firearms. Diet, exercise, and sleep habits. Work and work environment. Sunscreen use. Safety issues such as seatbelt and bike helmet use. Physical exam Your health care provider will check your: Height and weight. These may be used to calculate your BMI (body mass index). BMI is a measurement that tells if you are at a healthy weight. Waist circumference. This measures the distance around your waistline. This measurement also tells if you are at a healthy weight and may help predict your risk of certain diseases, such as type 2 diabetes and high blood pressure. Heart rate and blood pressure. Body temperature. Skin for abnormal spots. What immunizations do I need?  Vaccines are usually given at various ages, according to a schedule. Your health care provider will recommend vaccines for you based on your age, medical history, and lifestyle or other factors, such as travel or where you work. What tests do I need? Screening Your health care provider may recommend screening tests for certain conditions. This may include: Lipid and cholesterol levels. Hepatitis C test. Hepatitis B test. HIV (human immunodeficiency virus) test. STI (sexually transmitted infection) testing, if you are at  risk. Lung cancer screening. Colorectal cancer screening. Diabetes screening. This is done by checking your blood sugar (glucose) after you have not eaten for a while (fasting). Mammogram. Talk with your health care provider about how often you should have regular mammograms. BRCA-related cancer screening. This may be done if you have a family history of breast, ovarian, tubal, or peritoneal cancers. Bone density scan. This is done to screen for osteoporosis. Talk with your health care provider about your test results, treatment options, and if necessary, the need for more tests. Follow these instructions at home: Eating and drinking  Eat a diet that includes fresh fruits and vegetables, whole grains, lean protein, and low-fat dairy products. Limit your intake of foods with high amounts of sugar, saturated fats, and salt. Take vitamin and mineral supplements as recommended by your health care provider. Do not drink alcohol if your health care provider tells you not to drink. If you drink alcohol: Limit how much you have to 0-1 drink a day. Know how much alcohol is in your drink. In the [place] one drink equals one 12 oz bottle of beer (355 mL), one 5 oz glass of wine (148 mL), or one 1 oz glass of hard liquor (44 mL). Lifestyle Brush your teeth every morning and night with fluoride toothpaste. Floss one time each day. Exercise for at least 30 minutes 5 or more days each week. Do not use any products that contain nicotine or tobacco. These products include cigarettes, chewing tobacco, and vaping devices, such as e-cigarettes. If you need help quitting, ask your health care provider. Do not use drugs. If you are sexually active, practice safe sex. Use a condom or other form of protection in order to prevent STIs. Take aspirin only as told by   your health care provider. Make sure that you understand how much to take and what form to take. Work with your health care provider to find out whether it  is safe and beneficial for you to take aspirin daily. Ask your health care provider if you need to take a cholesterol-lowering medicine (statin). Find healthy ways to manage stress, such as: Meditation, yoga, or listening to music. Journaling. Talking to a trusted person. Spending time with friends and family. Minimize exposure to UV radiation to reduce your risk of skin cancer. Safety Always wear your seat belt while driving or riding in a vehicle. Do not drive: If you have been drinking alcohol. Do not ride with someone who has been drinking. When you are tired or distracted. While texting. If you have been using any mind-altering substances or drugs. Wear a helmet and other protective equipment during sports activities. If you have firearms in your house, make sure you follow all gun safety procedures. What's next? Visit your health care provider once a year for an annual wellness visit. Ask your health care provider how often you should have your eyes and teeth checked. Stay up to date on all vaccines. This information is not intended to replace advice given to you by your health care provider. Make sure you discuss any questions you have with your health care provider. Document Revised: 03/14/2021 Document Reviewed: 03/14/2021 Elsevier Patient Education  2023 [employer]  

## 2022-02-22 NOTE — Progress Notes (Signed)
Subjective:   By signing my name below, I, Cynthia May, attest that this documentation has been prepared under the direction and in the presence of Cynthia Held, DO. 02/22/2022     Patient ID: Cynthia May, female    DOB: 02/16/1951, 71 y.o.   MRN: [id number]  Chief Complaint  Patient presents with   Annual Exam    Pt states fasting     HPI Patient is in today for a comprehensive physical exam.  She reports she received a Cologuard kit from her insurance company.  She denies fever, hearing loss, ear pain,congestion, sinus pain, sore throat, eye pain, chest pain, palpitations, cough, shortness of breath, wheezing, nausea. vomiting, diarrhea, constipation, blood in stool, dysuria,frequency, hematuria and headaches.   She exercises by walking 3 miles everyday and plans to restart water aerobics this summer.  She is UTD on dental and vision.  She has 4 Covid-19 vaccines at this time.  Past Medical History:  Diagnosis Date   Asthma    Depression    Environmental allergies    HTN (hypertension)    Memory loss    Pancreatitis    Retinal vessel occlusion    Thyroid disease     Past Surgical History:  Procedure Laterality Date   CHOLECYSTECTOMY     EYE SURGERY Bilateral    Cataract sx. Dr.Beavis.   TONSILLECTOMY AND ADENOIDECTOMY      Family History  Problem Relation Age of Onset   Arthritis Mother    Heart disease Mother    Stroke Mother    Thyroid disease Mother    Alzheimer's disease Mother    Heart disease Father    Colon cancer Brother    Depression Sister    Alzheimer's disease Sister    Lung cancer Paternal Grandmother    Prostate cancer Brother    Heart disease Brother    Depression Sister        5 silbling had depression from Thyroid disease    Arthritis Sister    Lung cancer Brother    Other Sister        Creutzfeldt-Jacob Disease   Heart attack Brother     Social History   Socioeconomic History   Marital status: Widowed     Spouse name: Not on file   Number of children: 3   Years of education: Masters   Highest education level: Not on file  Occupational History   Occupation: Retired  Tobacco Use   Smoking status: Former   Smokeless tobacco: Never   Tobacco comments:    Quit 1990  Substance and Sexual Activity   Alcohol use: No    Alcohol/week: 0.0 standard drinks   Drug use: No   Sexual activity: Never  Other Topics Concern   Not on file  Social History Narrative   Exercise--  Pt is active during day-- no organized exercise.   Lives at home alone.   Right-handed.   1-2 diet sodas per day.   Social Determinants of Health   Financial Resource Strain: Low Risk    Difficulty of Paying Living Expenses: Not hard at all  Food Insecurity: No Food Insecurity   Worried About Charity fundraiser in the Last Year: Never true   [place] in the Last Year: Never true  Transportation Needs: No Transportation Needs   Lack of Transportation (Medical): No   Lack of Transportation (Non-Medical): No  Physical Activity: Sufficiently Active   Days of Exercise per Week:  7 days   Minutes of Exercise per Session: 30 min  Stress: No Stress Concern Present   Feeling of Stress : Not at all  Social Connections: Moderately Integrated   Frequency of Communication with Friends and Family: More than three times a week   Frequency of Social Gatherings with Friends and Family: More than three times a week   Attends Religious Services: More than 4 times per year   Active Member of [employer] or Organizations: Yes   Attends Archivist Meetings: More than 4 times per year   Marital Status: Widowed  Human resources officer Violence: Not At Risk   Fear of Current or Ex-Partner: No   Emotionally Abused: No   Physically Abused: No   Sexually Abused: No    Outpatient Medications Prior to Visit  Medication Sig Dispense Refill   alendronate (FOSAMAX) 70 MG tablet TAKE 1 TABLET BY EVERY 7 DAYS TAKE  ON  EMPTY  STOMACH   WITH  FULL  GLASS  OF  WATER 12 tablet 2   atorvastatin (LIPITOR) 10 MG tablet Take 1 tablet (10 mg total) by mouth daily. 90 tablet 1   DULoxetine (CYMBALTA) 60 MG capsule Take 1 capsule by mouth daily.  5   levothyroxine (SYNTHROID) 125 MCG tablet Take 1 tablet (125 mcg total) by mouth daily. 90 tablet 3   lisinopril (ZESTRIL) 40 MG tablet Take 1 tablet (40 mg total) by mouth daily. 90 tablet 3   traZODone (DESYREL) 50 MG tablet Take 1 nightly for sleep     No facility-administered medications prior to visit.    Allergies  Allergen Reactions   Augmentin [Amoxicillin-Pot Clavulanate] Other (See Comments)    Flu like symptoms   Erythromycin Other (See Comments)    Flu like symptoms   Other     Molds, trees, animal fur, grass.    Review of Systems  Constitutional:  Negative for fever.  HENT:  Negative for congestion, ear pain, hearing loss, sinus pain and sore throat.   Eyes:  Negative for blurred vision and pain.  Respiratory:  Negative for cough, sputum production, shortness of breath and wheezing.   Cardiovascular:  Negative for chest pain and palpitations.  Gastrointestinal:  Negative for blood in stool, constipation, diarrhea, nausea and vomiting.  Genitourinary:  Negative for dysuria, frequency, hematuria and urgency.  Musculoskeletal:  Negative for back pain, falls and myalgias.  Neurological:  Negative for dizziness, sensory change, loss of consciousness, weakness and headaches.  Endo/Heme/Allergies:  Negative for environmental allergies. Does not bruise/bleed easily.  Psychiatric/Behavioral:  Negative for depression and suicidal ideas. The patient is not nervous/anxious and does not have insomnia.       Objective:    Physical Exam Constitutional:      General: She is not in acute distress.    Appearance: Normal appearance. She is not ill-appearing.  HENT:     Head: Normocephalic and atraumatic.     Right Ear: Tympanic membrane, ear canal and external ear normal.      Left Ear: Tympanic membrane, ear canal and external ear normal.  Eyes:     Extraocular Movements: Extraocular movements intact.     Pupils: Pupils are equal, round, and reactive to light.  Cardiovascular:     Rate and Rhythm: Normal rate and regular rhythm.     Pulses: Normal pulses.     Heart sounds: Normal heart sounds. No murmur heard.   No gallop.  Pulmonary:     Effort: Pulmonary effort is normal.  No respiratory distress.     Breath sounds: Normal breath sounds. No wheezing, rhonchi or rales.  Abdominal:     General: Bowel sounds are normal. There is no distension.     Palpations: Abdomen is soft. There is no mass.     Tenderness: There is no abdominal tenderness. There is no guarding or rebound.     Hernia: No hernia is present.  Musculoskeletal:     Cervical back: Normal range of motion and neck supple.  Lymphadenopathy:     Cervical: No cervical adenopathy.  Skin:    General: Skin is warm and dry.  Neurological:     Mental Status: She is alert and oriented to person, place, and time.  Psychiatric:        Behavior: Behavior normal.    BP 132/88 (BP Location: Left Arm, Patient Position: Sitting, Cuff Size: Normal)   Pulse 88   Temp 98 F (36.7 C) (Oral)   Resp 18   Ht 5' 3" (1.6 m)   Wt 152 lb 9.6 oz (69.2 kg)   SpO2 97%   BMI 27.03 kg/m  Wt Readings from Last 3 Encounters:  02/22/22 152 lb 9.6 oz (69.2 kg)  01/08/22 153 lb 9.6 oz (69.7 kg)  12/14/21 150 lb 9.6 oz (68.3 kg)    Diabetic Foot Exam - Simple   No data filed    Lab Results  Component Value Date   WBC 6.0 12/14/2021   HGB 15.0 12/14/2021   HCT 45.1 12/14/2021   PLT 215.0 12/14/2021   GLUCOSE 79 12/14/2021   CHOL 136 12/14/2021   TRIG 94.0 12/14/2021   HDL 55.60 12/14/2021   LDLCALC 61 12/14/2021   ALT 14 12/14/2021   AST 20 12/14/2021   NA 143 12/14/2021   K 4.4 12/14/2021   CL 105 12/14/2021   CREATININE 0.82 12/14/2021   BUN 18 12/14/2021   CO2 33 (H) 12/14/2021   TSH 0.14 (L)  12/14/2021   HGBA1C 5.6 03/12/2018    Lab Results  Component Value Date   TSH 0.14 (L) 12/14/2021   Lab Results  Component Value Date   WBC 6.0 12/14/2021   HGB 15.0 12/14/2021   HCT 45.1 12/14/2021   MCV 88.9 12/14/2021   PLT 215.0 12/14/2021   Lab Results  Component Value Date   NA 143 12/14/2021   K 4.4 12/14/2021   CO2 33 (H) 12/14/2021   GLUCOSE 79 12/14/2021   BUN 18 12/14/2021   CREATININE 0.82 12/14/2021   BILITOT 1.1 12/14/2021   ALKPHOS 61 12/14/2021   AST 20 12/14/2021   ALT 14 12/14/2021   PROT 6.3 12/14/2021   ALBUMIN 4.2 12/14/2021   CALCIUM 9.8 12/14/2021   GFR 71.52 12/14/2021   Lab Results  Component Value Date   CHOL 136 12/14/2021   Lab Results  Component Value Date   HDL 55.60 12/14/2021   Lab Results  Component Value Date   LDLCALC 61 12/14/2021   Lab Results  Component Value Date   TRIG 94.0 12/14/2021   Lab Results  Component Value Date   CHOLHDL 2 12/14/2021   Lab Results  Component Value Date   HGBA1C 5.6 03/12/2018        Mammogram- Last checked on 03/05/2021. Results were normal. Repeat in 1 year. Colonoscopy- Last completed 07/17/2011. Dexa- Last checked on 05/12/2020. Patient was considered osteoporotic. Repeat in 2 years  Assessment & Plan:   Problem List Items Addressed This Visit  Unprioritized   Preventative health care - Primary    ghm utd Check labs  See avs        Relevant Orders   CBC with Differential/Platelet   Comprehensive metabolic panel   Lipid panel   TSH   Hypothyroidism    con't synthroid Check labs today  stable       Relevant Orders   TSH   Hyperlipidemia    Encourage heart healthy diet such as MIND or DASH diet, increase exercise, avoid trans fats, simple carbohydrates and processed foods, consider a krill or fish or flaxseed oil cap daily.        Relevant Orders   Comprehensive metabolic panel   Lipid panel   Essential hypertension    Well controlled, no changes  to meds. Encouraged heart healthy diet such as the DASH diet and exercise as tolerated.        Other Visit Diagnoses     Primary hypertension       Relevant Orders   CBC with Differential/Platelet   Comprehensive metabolic panel   Lipid panel   Estrogen deficiency       Relevant Orders   DG Bone Density       No orders of the defined types were placed in this encounter.   I,Cynthia May,acting as a Education administrator for [employer], DO.,have documented all relevant documentation on the behalf of Cynthia Held, DO,as directed by  Cynthia Held, DO while in the presence of [place], DO. , personally preformed the services described in this documentation.  All medical record entries made by the scribe were at my direction and in my presence.  I have reviewed the chart and discharge instructions (if applicable) and agree that the record reflects my personal performance and is accurate and complete. 02/22/2022

## 2022-02-22 NOTE — Assessment & Plan Note (Signed)
Well controlled, no changes to meds. Encouraged heart healthy diet such as the DASH diet and exercise as tolerated.  °

## 2022-02-22 NOTE — Assessment & Plan Note (Signed)
Encourage heart healthy diet such as MIND or DASH diet, increase exercise, avoid trans fats, simple carbohydrates and processed foods, consider a krill or fish or flaxseed oil cap daily.  °

## 2022-02-22 NOTE — Assessment & Plan Note (Signed)
ghm utd Check labs  See avs  

## 2022-02-26 DIAGNOSIS — E039 Hypothyroidism, unspecified: Secondary | ICD-10-CM | POA: Diagnosis not present

## 2022-03-11 ENCOUNTER — Ambulatory Visit (HOSPITAL_BASED_OUTPATIENT_CLINIC_OR_DEPARTMENT_OTHER)
Admission: RE | Admit: 2022-03-11 | Discharge: 2022-03-11 | Disposition: A | Discharge status: Home / Self Care | Payer: Medicare PPO | Source: Ambulatory Visit | Attending: Family Medicine | Admitting: Family Medicine

## 2022-03-11 DIAGNOSIS — E2839 Other primary ovarian failure: Secondary | ICD-10-CM

## 2022-03-21 DIAGNOSIS — H35033 Hypertensive retinopathy, bilateral: Secondary | ICD-10-CM | POA: Diagnosis not present

## 2022-03-21 DIAGNOSIS — H43811 Vitreous degeneration, right eye: Secondary | ICD-10-CM | POA: Diagnosis not present

## 2022-03-21 DIAGNOSIS — H43393 Other vitreous opacities, bilateral: Secondary | ICD-10-CM | POA: Diagnosis not present

## 2022-03-21 DIAGNOSIS — H34832 Tributary (branch) retinal vein occlusion, left eye, with macular edema: Secondary | ICD-10-CM | POA: Diagnosis not present

## 2022-03-27 ENCOUNTER — Other Ambulatory Visit: Payer: Self-pay | Admitting: Family Medicine

## 2022-03-27 DIAGNOSIS — Z1231 Encounter for screening mammogram for malignant neoplasm of breast: Secondary | ICD-10-CM | POA: Diagnosis not present

## 2022-03-27 LAB — HM MAMMOGRAPHY

## 2022-03-28 ENCOUNTER — Encounter: Payer: Self-pay | Admitting: *Deleted

## 2022-05-13 ENCOUNTER — Ambulatory Visit: Payer: Medicare PPO

## 2022-05-14 ENCOUNTER — Ambulatory Visit (HOSPITAL_BASED_OUTPATIENT_CLINIC_OR_DEPARTMENT_OTHER)
Admission: RE | Admit: 2022-05-14 | Discharge: 2022-05-14 | Disposition: A | Discharge status: Home / Self Care | Payer: Medicare PPO | Source: Ambulatory Visit | Attending: Family Medicine | Admitting: Family Medicine

## 2022-05-14 DIAGNOSIS — E2839 Other primary ovarian failure: Secondary | ICD-10-CM | POA: Insufficient documentation

## 2022-05-14 DIAGNOSIS — M8588 Other specified disorders of bone density and structure, other site: Secondary | ICD-10-CM | POA: Diagnosis not present

## 2022-05-30 ENCOUNTER — Telehealth: Payer: Self-pay | Admitting: Family Medicine

## 2022-05-30 DIAGNOSIS — H34832 Tributary (branch) retinal vein occlusion, left eye, with macular edema: Secondary | ICD-10-CM | POA: Diagnosis not present

## 2022-05-30 DIAGNOSIS — H43811 Vitreous degeneration, right eye: Secondary | ICD-10-CM | POA: Diagnosis not present

## 2022-05-30 DIAGNOSIS — H43393 Other vitreous opacities, bilateral: Secondary | ICD-10-CM | POA: Diagnosis not present

## 2022-05-30 DIAGNOSIS — H35033 Hypertensive retinopathy, bilateral: Secondary | ICD-10-CM | POA: Diagnosis not present

## 2022-05-30 NOTE — Telephone Encounter (Signed)
Left message for patient to call back and schedule Medicare Annual Wellness Visit (AWV).   Please offer to do virtually or by telephone.  Left office number and my jabber [phone].  Last AWV:05/10/2021  Please schedule at anytime with Nurse Health Advisor.

## 2022-06-07 ENCOUNTER — Telehealth: Payer: Self-pay | Admitting: Family Medicine

## 2022-06-07 NOTE — Telephone Encounter (Signed)
Left message for patient to call back and schedule Medicare Annual Wellness Visit (AWV).   Please offer to do virtually or by telephone.  Left office number and my jabber [phone].  Last AWV:05/10/2021  Please schedule at anytime with Nurse Health Advisor.

## 2022-06-13 ENCOUNTER — Other Ambulatory Visit: Payer: Self-pay | Admitting: Family Medicine

## 2022-06-13 DIAGNOSIS — E785 Hyperlipidemia, unspecified: Secondary | ICD-10-CM

## 2022-06-24 ENCOUNTER — Ambulatory Visit (INDEPENDENT_AMBULATORY_CARE_PROVIDER_SITE_OTHER): Payer: Medicare PPO

## 2022-06-24 VITALS — Ht 63.0 in | Wt 155.0 lb

## 2022-06-24 DIAGNOSIS — Z0000 Encounter for general adult medical examination without abnormal findings: Secondary | ICD-10-CM

## 2022-06-24 NOTE — Progress Notes (Signed)
Subjective:   Cynthia May is a 71 y.o. female who presents for Medicare Annual (Subsequent) preventive examination.  I connected with Nathalie today by telephone and verified that I am speaking with the correct person using two identifiers. Location patient: home Location provider: work Persons participating in the virtual visit: patient, Marine scientist.    I discussed the limitations, risks, security and privacy concerns of performing an evaluation and management service by telephone and the availability of in person appointments. I also discussed with the patient that there may be a patient responsible charge related to this service. The patient expressed understanding and verbally consented to this telephonic visit.    Interactive audio and video telecommunications were attempted between this provider and patient, however failed, due to patient having technical difficulties OR patient did not have access to video capability.  We continued and completed visit with audio only.  Some vital signs may be absent or patient reported.   Time Spent with patient on telephone encounter: 20 minutes   Review of Systems     Cardiac Risk Factors include: advanced age (>36mn, >>88women);hypertension;dyslipidemia     Objective:    Today's Vitals   06/24/22 1315  Weight: 155 lb (70.3 kg)  Height: '5\' 3"'$  (1.6 m)   Body mass index is 27.46 kg/m.     06/24/2022    1:18 PM 05/10/2021    2:22 PM 05/03/2020    1:04 PM 01/29/2019   10:07 AM 01/27/2018   11:22 AM 01/24/2017   10:54 AM 11/10/2015    3:40 PM  Advanced Directives  Does Patient Have a Medical Advance Directive? Yes Yes Yes Yes No No No  Type of AParamedicof [place] will [place]Living will [place]Living will [place]Living will     Does patient want to make changes to medical advance directive?   No - Patient declined No - Patient declined    Yes - information given  Copy of HDunwoodyin Chart? Yes - validated most recent copy scanned in chart (See row information) Yes - validated most recent copy scanned in chart (See row information) Yes - validated most recent copy scanned in chart (See row information) No - copy requested   No - copy requested  Would patient like information on creating a medical advance directive?     No - Patient declined No - Patient declined Yes - Educational materials given    Current Medications (verified) Outpatient Encounter Medications as of 06/24/2022  Medication Sig   alendronate (FOSAMAX) 70 MG tablet TAKE 1 TABLET BY MOUTH EVERY 7 DAYS ON EMPTY STOMACH WITH FULL GLASS OF WATER   atorvastatin (LIPITOR) 10 MG tablet Take 1 tablet (10 mg total) by mouth daily.   DULoxetine (CYMBALTA) 60 MG capsule Take 1 capsule by mouth daily.   levothyroxine (SYNTHROID) 125 MCG tablet Take 1 tablet (125 mcg total) by mouth daily.   lisinopril (ZESTRIL) 40 MG tablet Take 1 tablet (40 mg total) by mouth daily.   traZODone (DESYREL) 50 MG tablet Take 1 nightly for sleep   No facility-administered encounter medications on file as of 06/24/2022.    Allergies (verified) Augmentin [amoxicillin-pot clavulanate], Erythromycin, and Other   History: Past Medical History:  Diagnosis Date   Asthma    Depression    Environmental allergies    HTN (hypertension)    Memory loss    Pancreatitis    Retinal vessel occlusion  Thyroid disease    Past Surgical History:  Procedure Laterality Date   CHOLECYSTECTOMY     EYE SURGERY Bilateral    Cataract sx. Dr.Beavis.   TONSILLECTOMY AND ADENOIDECTOMY     Family History  Problem Relation Age of Onset   Arthritis Mother    Heart disease Mother    Stroke Mother    Thyroid disease Mother    Alzheimer's disease Mother    Heart disease Father    Colon cancer Brother    Depression Sister    Alzheimer's disease Sister    Lung cancer Paternal  Grandmother    Prostate cancer Brother    Heart disease Brother    Depression Sister        5 silbling had depression from Thyroid disease    Arthritis Sister    Lung cancer Brother    Other Sister        Creutzfeldt-Jacob Disease   Heart attack Brother    Social History   Socioeconomic History   Marital status: Widowed    Spouse name: Not on file   Number of children: 3   Years of education: Masters   Highest education level: Not on file  Occupational History   Occupation: Retired  Tobacco Use   Smoking status: Former   Smokeless tobacco: Never   Tobacco comments:    Quit 1990  Substance and Sexual Activity   Alcohol use: No    Alcohol/week: 0.0 standard drinks of alcohol   Drug use: No   Sexual activity: Never  Other Topics Concern   Not on file  Social History Narrative   Exercise--  Pt is active during day-- no organized exercise.   Lives at home alone.   Right-handed.   1-2 diet sodas per day.   Social Determinants of Health   Financial Resource Strain: Low Risk  (06/24/2022)   Overall Financial Resource Strain (CARDIA)    Difficulty of Paying Living Expenses: Not hard at all  Food Insecurity: No Food Insecurity (06/24/2022)   Hunger Vital Sign    Worried About Running Out of Food in the Last Year: Never true    Ran Out of Food in the Last Year: Never true  Transportation Needs: No Transportation Needs (06/24/2022)   PRAPARE - Hydrologist (Medical): No    Lack of Transportation (Non-Medical): No  Physical Activity: Sufficiently Active (06/24/2022)   Exercise Vital Sign    Days of Exercise per Week: 7 days    Minutes of Exercise per Session: 40 min  Stress: No Stress Concern Present (06/24/2022)   [employer]    Feeling of Stress : Not at all  Social Connections: Moderately Integrated (06/24/2022)   Social Connection and Isolation Panel [NHANES]    Frequency of  Communication with Friends and Family: More than three times a week    Frequency of Social Gatherings with Friends and Family: More than three times a week    Attends Religious Services: More than 4 times per year    Active Member of [employer] or Organizations: Yes    Attends Archivist Meetings: More than 4 times per year    Marital Status: Widowed    Tobacco Counseling Counseling given: Not Answered Tobacco comments: Quit 1990   Clinical Intake:  Pre-visit preparation completed: Yes  Pain : No/denies pain     BMI - recorded: 27.46 Nutritional Status: BMI 25 -29 Overweight Nutritional Risks: None  Diabetes: No  How often do you need to have someone help you when you read instructions, pamphlets, or other written materials from your doctor or pharmacy?: 1 - Never  Diabetic?No  Interpreter Needed?: No  Information entered by :: Caroleen Hamman LPN   Activities of Daily Living    06/24/2022    1:21 PM  In your present state of health, do you have any difficulty performing the following activities:  Hearing? 0  Vision? 0  Difficulty concentrating or making decisions? 0  Walking or climbing stairs? 0  Dressing or bathing? 0  Doing errands, shopping? 0  Preparing Food and eating ? N  Using the Toilet? N  In the past six months, have you accidently leaked urine? N  Do you have problems with loss of bowel control? N  Managing your Medications? N  Managing your Finances? N  Housekeeping or managing your Housekeeping? N    Patient Care Team: Carollee Herter, Alferd Apa, DO as PCP - General (Family Medicine) Lendon Colonel, MD as Referring Physician (Psychiatry) Amalia Greenhouse, MD as Referring Physician (Endocrinology) Desma Maxim, MD as Consulting Physician (Ophthalmology)  Indicate any recent Medical Services you may have received from other than [facility] providers in the past year (date may be approximate).     Assessment:   This is a routine wellness examination  for Loveda.  Hearing/Vision screen Hearing Screening - Comments:: No issues Vision Screening - Comments:: Last eye exam-02/2022-  Dietary issues and exercise activities discussed: Current Exercise Habits: Home exercise routine, Type of exercise: walking, Time (Minutes): 45, Frequency (Times/Week): 7, Weekly Exercise (Minutes/Week): 315, Intensity: Mild, Exercise limited by: None identified   Goals Addressed             This Visit's Progress    DIET - INCREASE WATER INTAKE   Not on track    Increase physical activity   On track      Depression Screen    06/24/2022    1:21 PM 02/22/2022   11:04 AM 05/10/2021    2:25 PM 05/03/2020    1:08 PM 01/29/2019   10:09 AM 01/27/2018   11:25 AM 01/24/2017   10:54 AM  PHQ 2/9 Scores  PHQ - 2 Score 0 1 0 0 0 1 0    Fall Risk    06/24/2022    1:19 PM 02/22/2022   11:04 AM 05/10/2021    2:24 PM 05/03/2020    1:08 PM 01/29/2019   10:09 AM  [place] in the past year? 0 0 0 0 0  Number falls in past yr: 0 0 0 0   Injury with Fall? 0 0 0 0   Follow up Falls prevention discussed Falls evaluation completed Falls prevention discussed Education provided;Falls prevention discussed     FALL RISK PREVENTION PERTAINING TO THE HOME:  Any stairs in or around the home? No  Home free of loose throw rugs in walkways, pet beds, electrical cords, etc? Yes  Adequate lighting in your home to reduce risk of falls? Yes   ASSISTIVE DEVICES UTILIZED TO PREVENT FALLS:  Life alert? No  Use of a cane, walker or w/c? No  Grab bars in the bathroom? No  Shower chair or bench in shower? No  Elevated toilet seat or a handicapped toilet? No   TIMED UP AND GO:  Was the test performed? No . Phone visit   Cognitive Function:    01/24/2017   10:55 AM 08/28/2016   11:37  AM 02/28/2016    9:03 AM 11/10/2015    3:42 PM  MMSE - Mini Mental State Exam  Orientation to time '5 5 5 5  '$ Orientation to Place '5 5 5 5  '$ Registration '3 3 3 3  '$ Attention/ Calculation '5 5  5 5  '$ Recall '3 3 3 3  '$ Language- name 2 objects '2 2 2 2  '$ Language- repeat '1 1 1 1  '$ Language- follow 3 step command '3 3 3 3  '$ Language- read & follow direction '1 1 1 1  '$ Write a sentence '1 1 1 1  '$ Copy design '1 1 1 1  '$ Total score '30 30 30 30        '$ 06/24/2022    1:30 PM  6CIT Screen  What Year? 0 points  What month? 0 points  What time? 0 points  Count back from 20 0 points  Months in reverse 0 points  Repeat phrase 2 points  Total Score 2 points    Immunizations Immunization History  Administered Date(s) Administered   Fluad Quad(high Dose 65+) 06/17/2021   Influenza, High Dose Seasonal PF 07/04/2016, 06/29/2018, 06/07/2019   Influenza-Unspecified 07/02/2017, 06/14/2020, 06/22/2022   PFIZER(Purple Top)SARS-COV-2 Vaccination 11/20/2019, 12/14/2019, 06/26/2020, 06/22/2021   Pfizer Covid-19 Vaccine Bivalent Booster 33yr & up 06/22/2021   Pneumococcal Conjugate-13 01/19/2015   Pneumococcal Polysaccharide-23 01/24/2016   Tdap 01/19/2015   Zoster Recombinat (Shingrix) 03/15/2017, 05/31/2017   Zoster, Live 07/11/2010    TDAP status: Up to date  Flu Vaccine status: Due, Education has been provided regarding the importance of this vaccine. Advised may receive this vaccine at local pharmacy or Health Dept. Aware to provide a copy of the vaccination record if obtained from local pharmacy or Health Dept. Verbalized acceptance and understanding.  Pneumococcal vaccine status: Up to date  Covid-19 vaccine status: Information provided on how to obtain vaccines.   Qualifies for Shingles Vaccine? No   Zostavax completed Yes   Shingrix Completed?: Yes  Screening Tests Health Maintenance  Topic Date Due   COLONOSCOPY (Pts 45-452yrInsurance coverage will need to be confirmed)  07/16/2021   COVID-19 Vaccine (5 - Pfizer risk series) 08/17/2021   MAMMOGRAM  03/28/2023   DEXA SCAN  05/14/2024   TETANUS/TDAP  01/18/2025   Pneumonia Vaccine 6575Years old  Completed   INFLUENZA VACCINE   Completed   Hepatitis C Screening  Completed   Zoster Vaccines- Shingrix  Completed   HPV VACCINES  Aged Out    Health Maintenance  Health Maintenance Due  Topic Date Due   COLONOSCOPY (Pts [phone] coverage will need to be confirmed)  07/16/2021   COVID-19 Vaccine (5 - Pfizer risk series) 08/17/2021    Colorectal cancer screening: Due-Patient to discuss with PCP at next visit  Mammogram status: Completed bilateral 03/27/2022 . Repeat every year  Bone Density status: Completed 05/14/2022. Results reflect: Bone density results: OSTEOPENIA. Repeat every 2 years.  Lung Cancer Screening: (Low Dose CT Chest recommended if Age 18-61-80ars, 30 pack-year currently smoking OR have quit w/in 15years.) does not qualify.     Additional Screening:  Hepatitis C Screening: Completed 11/10/2015  Vision Screening: Recommended annual ophthalmology exams for early detection of glaucoma and other disorders of the eye. Is the patient up to date with their annual eye exam?  Yes  Who is the provider or what is the name of the office in which the patient attends annual eye exams? Pt unsure of name   Dental Screening: Recommended annual dental  exams for proper oral hygiene  Community Resource Referral / Chronic Care Management: CRR required this visit?  No   CCM required this visit?  No      Plan:     I have personally reviewed and noted the following in the patient's chart:   Medical and social history Use of alcohol, tobacco or illicit drugs  Current medications and supplements including opioid prescriptions. Patient is not currently taking opioid prescriptions. Functional ability and status Nutritional status Physical activity Advanced directives List of other physicians Hospitalizations, surgeries, and ER visits in previous 12 months Vitals Screenings to include cognitive, depression, and falls Referrals and appointments  In addition, I have reviewed and discussed with  patient certain preventive protocols, quality metrics, and best practice recommendations. A written personalized care plan for preventive services as well as general preventive health recommendations were provided to patient.   Due to this being a telephonic visit, the after visit summary with patients personalized plan was offered to patient via mail or my-chart. Patient would like to access on my-chart.   Marta Antu, LPN   4/40/1027  Nurse Health Advisor  Nurse Notes: None

## 2022-06-24 NOTE — Patient Instructions (Signed)
Cynthia May , Thank you for taking time to complete your Medicare Wellness Visit. I appreciate your ongoing commitment to your health goals. Please review the following plan we discussed and let me know if I can assist you in the future.   Screening recommendations/referrals: Colonoscopy: Due-Per pur conversation, you will discuss Cologuard with Dr. Etter Sjogren at next visit. Mammogram: Completed 03/27/2022-Due 03/28/2023 Bone Density: Completed 05/14/2022-Due 05/12/2024 Recommended yearly ophthalmology/optometry visit for glaucoma screening and checkup Recommended yearly dental visit for hygiene and checkup  Vaccinations: Influenza vaccine: Up to date Pneumococcal vaccine: Up to date Tdap vaccine: Up to date Shingles vaccine: Completed vaccines   Covid-19:Booster available at your local pharmacy  Advanced directives: Copy in chart  Conditions/risks identified: See problem list  Next appointment: Follow up in one year for your annual wellness visit    Preventive Care 65 Years and Older, Female Preventive care refers to lifestyle choices and visits with your health care provider that can promote health and wellness. What does preventive care include? A yearly physical exam. This is also called an annual well check. Dental exams once or twice a year. Routine eye exams. Ask your health care provider how often you should have your eyes checked. Personal lifestyle choices, including: Daily care of your teeth and gums. Regular physical activity. Eating a healthy diet. Avoiding tobacco and drug use. Limiting alcohol use. Practicing safe sex. Taking low-dose aspirin every day. Taking vitamin and mineral supplements as recommended by your health care provider. What happens during an annual well check? The services and screenings done by your health care provider during your annual well check will depend on your age, overall health, lifestyle risk factors, and family history of  disease. Counseling  Your health care provider may ask you questions about your: Alcohol use. Tobacco use. Drug use. Emotional well-being. Home and relationship well-being. Sexual activity. Eating habits. History of falls. Memory and ability to understand (cognition). Work and work Statistician. Reproductive health. Screening  You may have the following tests or measurements: Height, weight, and BMI. Blood pressure. Lipid and cholesterol levels. These may be checked every 5 years, or more frequently if you are over 54 years old. Skin check. Lung cancer screening. You may have this screening every year starting at age 54 if you have a 30-pack-year history of smoking and currently smoke or have quit within the past 15 years. Fecal occult blood test (FOBT) of the stool. You may have this test every year starting at age 82. Flexible sigmoidoscopy or colonoscopy. You may have a sigmoidoscopy every 5 years or a colonoscopy every 10 years starting at age 41. Hepatitis C blood test. Hepatitis B blood test. Sexually transmitted disease (STD) testing. Diabetes screening. This is done by checking your blood sugar (glucose) after you have not eaten for a while (fasting). You may have this done every 1-3 years. Bone density scan. This is done to screen for osteoporosis. You may have this done starting at age 21. Mammogram. This may be done every 1-2 years. Talk to your health care provider about how often you should have regular mammograms. Talk with your health care provider about your test results, treatment options, and if necessary, the need for more tests. Vaccines  Your health care provider may recommend certain vaccines, such as: Influenza vaccine. This is recommended every year. Tetanus, diphtheria, and acellular pertussis (Tdap, Td) vaccine. You may need a Td booster every 10 years. Zoster vaccine. You may need this after age 53. Pneumococcal 13-valent conjugate (PCV13)  vaccine. One  dose is recommended after age 34. Pneumococcal polysaccharide (PPSV23) vaccine. One dose is recommended after age 43. Talk to your health care provider about which screenings and vaccines you need and how often you need them. This information is not intended to replace advice given to you by your health care provider. Make sure you discuss any questions you have with your health care provider. Document Released: 10/13/2015 Document Revised: 06/05/2016 Document Reviewed: 07/18/2015 Elsevier Interactive Patient Education  2017 [place] Prevention in the Home Falls can cause injuries. They can happen to people of all ages. There are many things you can do to make your home safe and to help prevent falls. What can I do on the outside of my home? Regularly fix the edges of walkways and driveways and fix any cracks. Remove anything that might make you trip as you walk through a door, such as a raised step or threshold. Trim any bushes or trees on the path to your home. Use bright outdoor lighting. Clear any walking paths of anything that might make someone trip, such as rocks or tools. Regularly check to see if handrails are loose or broken. Make sure that both sides of any steps have handrails. Any raised decks and porches should have guardrails on the edges. Have any leaves, snow, or ice cleared regularly. Use sand or salt on walking paths during winter. Clean up any spills in your garage right away. This includes oil or grease spills. What can I do in the bathroom? Use night lights. Install grab bars by the toilet and in the tub and shower. Do not use towel bars as grab bars. Use non-skid mats or decals in the tub or shower. If you need to sit down in the shower, use a plastic, non-slip stool. Keep the floor dry. Clean up any water that spills on the floor as soon as it happens. Remove soap buildup in the tub or shower regularly. Attach bath mats securely with double-sided  non-slip rug tape. Do not have throw rugs and other things on the floor that can make you trip. What can I do in the bedroom? Use night lights. Make sure that you have a light by your bed that is easy to reach. Do not use any sheets or blankets that are too big for your bed. They should not hang down onto the floor. Have a firm chair that has side arms. You can use this for support while you get dressed. Do not have throw rugs and other things on the floor that can make you trip. What can I do in the kitchen? Clean up any spills right away. Avoid walking on wet floors. Keep items that you use a lot in easy-to-reach places. If you need to reach something above you, use a strong step stool that has a grab bar. Keep electrical cords out of the way. Do not use floor polish or wax that makes floors slippery. If you must use wax, use non-skid floor wax. Do not have throw rugs and other things on the floor that can make you trip. What can I do with my stairs? Do not leave any items on the stairs. Make sure that there are handrails on both sides of the stairs and use them. Fix handrails that are broken or loose. Make sure that handrails are as long as the stairways. Check any carpeting to make sure that it is firmly attached to the stairs. Fix any carpet that is loose  or worn. Avoid having throw rugs at the top or bottom of the stairs. If you do have throw rugs, attach them to the floor with carpet tape. Make sure that you have a light switch at the top of the stairs and the bottom of the stairs. If you do not have them, ask someone to add them for you. What else can I do to help prevent falls? Wear shoes that: Do not have high heels. Have rubber bottoms. Are comfortable and fit you well. Are closed at the toe. Do not wear sandals. If you use a stepladder: Make sure that it is fully opened. Do not climb a closed stepladder. Make sure that both sides of the stepladder are locked into place. Ask  someone to hold it for you, if possible. Clearly mark and make sure that you can see: Any grab bars or handrails. First and last steps. Where the edge of each step is. Use tools that help you move around (mobility aids) if they are needed. These include: Canes. Walkers. Scooters. Crutches. Turn on the lights when you go into a dark area. Replace any light bulbs as soon as they burn out. Set up your furniture so you have a clear path. Avoid moving your furniture around. If any of your floors are uneven, fix them. If there are any pets around you, be aware of where they are. Review your medicines with your doctor. Some medicines can make you feel dizzy. This can increase your chance of falling. Ask your doctor what other things that you can do to help prevent falls. This information is not intended to replace advice given to you by your health care provider. Make sure you discuss any questions you have with your health care provider. Document Released: 07/13/2009 Document Revised: 02/22/2016 Document Reviewed: 10/21/2014 Elsevier Interactive Patient Education  2017 [employer].

## 2022-07-08 DIAGNOSIS — E039 Hypothyroidism, unspecified: Secondary | ICD-10-CM | POA: Diagnosis not present

## 2022-07-15 DIAGNOSIS — F330 Major depressive disorder, recurrent, mild: Secondary | ICD-10-CM | POA: Diagnosis not present

## 2022-08-08 DIAGNOSIS — H34832 Tributary (branch) retinal vein occlusion, left eye, with macular edema: Secondary | ICD-10-CM | POA: Diagnosis not present

## 2022-08-08 DIAGNOSIS — H43393 Other vitreous opacities, bilateral: Secondary | ICD-10-CM | POA: Diagnosis not present

## 2022-08-08 DIAGNOSIS — H35033 Hypertensive retinopathy, bilateral: Secondary | ICD-10-CM | POA: Diagnosis not present

## 2022-08-08 DIAGNOSIS — H43811 Vitreous degeneration, right eye: Secondary | ICD-10-CM | POA: Diagnosis not present

## 2022-09-16 ENCOUNTER — Telehealth: Payer: Self-pay | Admitting: Family Medicine

## 2022-09-16 MED ORDER — ALENDRONATE SODIUM 70 MG PO TABS: ORAL_TABLET | ORAL | 1 refills | Status: DC

## 2022-09-16 NOTE — Telephone Encounter (Signed)
Refill sent.

## 2022-09-16 NOTE — Telephone Encounter (Signed)
Prescription Request  09/16/2022  Is this a "Controlled Substance" medicine? No  LOV: Visit date not found  What is the name of the medication or equipment?   alendronate (FOSAMAX) 70 MG tablet [[id number]]   Have you contacted your pharmacy to request a refill? Yes   Which pharmacy would you like this sent to?  [facility] - [place], [place] - [address] [place] [address] Phone: [phone] Fax: [phone]    Patient notified that their request is being sent to the clinical staff for review and that they should receive a response within 2 business days.   Please advise at Mobile [phone] (mobile)

## 2022-09-24 ENCOUNTER — Other Ambulatory Visit: Payer: Self-pay | Admitting: Family Medicine

## 2022-09-24 DIAGNOSIS — E785 Hyperlipidemia, unspecified: Secondary | ICD-10-CM

## 2022-10-08 ENCOUNTER — Telehealth (INDEPENDENT_AMBULATORY_CARE_PROVIDER_SITE_OTHER): Payer: Medicare PPO | Admitting: Family Medicine

## 2022-10-08 ENCOUNTER — Encounter: Payer: Self-pay | Admitting: Family Medicine

## 2022-10-08 DIAGNOSIS — J40 Bronchitis, not specified as acute or chronic: Secondary | ICD-10-CM

## 2022-10-08 MED ORDER — PREDNISONE 10 MG PO TABS: ORAL_TABLET | ORAL | 0 refills | Status: DC

## 2022-10-08 MED ORDER — DOXYCYCLINE HYCLATE 100 MG PO TABS: 100.0000 mg | ORAL_TABLET | Freq: Two times a day (BID) | ORAL | 0 refills | Status: DC

## 2022-10-08 MED ORDER — PROMETHAZINE-DM 6.25-15 MG/5ML PO SYRP: 5.0000 mL | ORAL_SOLUTION | Freq: Four times a day (QID) | ORAL | 0 refills | Status: DC | PRN

## 2022-10-08 NOTE — Progress Notes (Signed)
MyChart Video Visit    Virtual Visit via Video Note   This visit type was conducted due to national recommendations for restrictions regarding the COVID-19 Pandemic (e.g. social distancing) in an effort to limit this patient's exposure and mitigate transmission in our community. This patient is at least at moderate risk for complications without adequate follow up. This format is felt to be most appropriate for this patient at this time. Physical exam was limited by quality of the video and audio technology used for the visit. Nira Conn  was able to get the patient set up on a video visit.  Patient location: home  Patient and provider in visit Provider location: Office  I discussed the limitations of evaluation and management by telemedicine and the availability of in person appointments. The patient expressed understanding and agreed to proceed.  Visit Date: 10/08/2022  Today's healthcare provider: [name], DO     Subjective:  co  Patient ID: Cynthia May, female    DOB: 04-Oct-1950, 72 y.o.   MRN: [id number]  Chief Complaint  Patient presents with   Cough    Pt states having cough, body aches and fatigue. Pt had negative COVID on 10/06/22    HPI Patient is in today for cough since last Thursday --  neg covid test on sun.   No fever  + productive cough     + sinus pressure  + mucinex , tylenol, cold and flu, deslym  +Hot and cold    Past Medical History:  Diagnosis Date   Asthma    Depression    Environmental allergies    HTN (hypertension)    Memory loss    Pancreatitis    Retinal vessel occlusion    Thyroid disease     Past Surgical History:  Procedure Laterality Date   CHOLECYSTECTOMY     EYE SURGERY Bilateral    Cataract sx. Dr.Beavis.   TONSILLECTOMY AND ADENOIDECTOMY      Family History  Problem Relation Age of Onset   Arthritis Mother    Heart disease Mother    Stroke Mother    Thyroid disease Mother    Alzheimer's disease Mother     Heart disease Father    Colon cancer Brother    Depression Sister    Alzheimer's disease Sister    Lung cancer Paternal Grandmother    Prostate cancer Brother    Heart disease Brother    Depression Sister        5 silbling had depression from Thyroid disease    Arthritis Sister    Lung cancer Brother    Other Sister        Creutzfeldt-Jacob Disease   Heart attack Brother     Social History   Socioeconomic History   Marital status: Widowed    Spouse name: Not on file   Number of children: 3   Years of education: Masters   Highest education level: Not on file  Occupational History   Occupation: Retired  Tobacco Use   Smoking status: Former   Smokeless tobacco: Never   Tobacco comments:    Quit 1990  Substance and Sexual Activity   Alcohol use: No    Alcohol/week: 0.0 standard drinks of alcohol   Drug use: No   Sexual activity: Never  Other Topics Concern   Not on file  Social History Narrative   Exercise--  Pt is active during day-- no organized exercise.   Lives at home alone.   Right-handed.  1-2 diet sodas per day.   Social Determinants of Health   Financial Resource Strain: Low Risk  (06/24/2022)   Overall Financial Resource Strain (CARDIA)    Difficulty of Paying Living Expenses: Not hard at all  Food Insecurity: No Food Insecurity (06/24/2022)   Hunger Vital Sign    Worried About Running Out of Food in the Last Year: Never true    Ran Out of Food in the Last Year: Never true  Transportation Needs: No Transportation Needs (06/24/2022)   PRAPARE - Hydrologist (Medical): No    Lack of Transportation (Non-Medical): No  Physical Activity: Sufficiently Active (06/24/2022)   Exercise Vital Sign    Days of Exercise per Week: 7 days    Minutes of Exercise per Session: 40 min  Stress: No Stress Concern Present (06/24/2022)   [place]    Feeling of Stress : Not at  all  Social Connections: Moderately Integrated (06/24/2022)   Social Connection and Isolation Panel [NHANES]    Frequency of Communication with Friends and Family: More than three times a week    Frequency of Social Gatherings with Friends and Family: More than three times a week    Attends Religious Services: More than 4 times per year    Active Member of [employer] or Organizations: Yes    Attends Archivist Meetings: More than 4 times per year    Marital Status: Widowed  Intimate Partner Violence: Not At Risk (06/24/2022)   Humiliation, Afraid, Rape, and Kick questionnaire    Fear of Current or Ex-Partner: No    Emotionally Abused: No    Physically Abused: No    Sexually Abused: No    Outpatient Medications Prior to Visit  Medication Sig Dispense Refill   alendronate (FOSAMAX) 70 MG tablet TAKE 1 TABLET BY MOUTH EVERY 7 DAYS ON EMPTY STOMACH WITH FULL GLASS OF WATER 12 tablet 1   atorvastatin (LIPITOR) 10 MG tablet Take 1 tablet by mouth once daily 90 tablet 1   DULoxetine (CYMBALTA) 60 MG capsule Take 1 capsule by mouth daily.  5   levothyroxine (SYNTHROID) 125 MCG tablet Take 1 tablet (125 mcg total) by mouth daily. 90 tablet 3   lisinopril (ZESTRIL) 40 MG tablet Take 1 tablet (40 mg total) by mouth daily. 90 tablet 3   traZODone (DESYREL) 50 MG tablet Take 1 nightly for sleep     No facility-administered medications prior to visit.    Allergies  Allergen Reactions   Augmentin [Amoxicillin-Pot Clavulanate] Other (See Comments)    Flu like symptoms   Erythromycin Other (See Comments)    Flu like symptoms   Other     Molds, trees, animal fur, grass.    Review of Systems  Constitutional:  Positive for chills and malaise/fatigue. Negative for fever.  HENT:  Positive for congestion, sinus pain and sore throat.   Eyes:  Negative for blurred vision.  Respiratory:  Positive for cough, sputum production and wheezing. Negative for shortness of breath.   Cardiovascular:   Negative for chest pain, palpitations and leg swelling.  Gastrointestinal:  Negative for vomiting.  Musculoskeletal:  Negative for back pain.  Skin:  Negative for rash.  Neurological:  Negative for loss of consciousness and headaches.       Objective:    Physical Exam Vitals and nursing note reviewed.  Constitutional:      General: She is not in acute distress.  Appearance: She is toxic-appearing.  HENT:     Nose: Congestion present.     Right Sinus: Maxillary sinus tenderness and frontal sinus tenderness present.     Left Sinus: Maxillary sinus tenderness and frontal sinus tenderness present.  Pulmonary:     Effort: Pulmonary effort is normal.  Neurological:     Mental Status: She is alert.     There were no vitals taken for this visit. Wt Readings from Last 3 Encounters:  06/24/22 155 lb (70.3 kg)  02/22/22 152 lb 9.6 oz (69.2 kg)  01/08/22 153 lb 9.6 oz (69.7 kg)       Assessment & Plan:  Bronchitis Assessment & Plan: Doxycycline  Pred taper  Cough syrup  F/u in office as needed   Orders: -     Doxycycline Hyclate; Take 1 tablet (100 mg total) by mouth 2 (two) times daily.  Dispense: 20 tablet; Refill: 0 -     predniSONE; TAKE 3 TABLETS PO QD FOR 3 DAYS THEN TAKE 2 TABLETS PO QD FOR 3 DAYS THEN TAKE 1 TABLET PO QD FOR 3 DAYS THEN TAKE 1/2 TAB PO QD FOR 3 DAYS  Dispense: 20 tablet; Refill: 0 -     Promethazine-DM; Take 5 mLs by mouth 4 (four) times daily as needed.  Dispense: 118 mL; Refill: 0     I discussed the assessment and treatment plan with the patient. The patient was provided an opportunity to ask questions and all were answered. The patient agreed with the plan and demonstrated an understanding of the instructions.   The patient was advised to call back or seek an in-person evaluation if the symptoms worsen or if the condition fails to improve as anticipated.  [name], DO [place] [facility] [phone] (phone) [phone] (fax)  [place]

## 2022-10-08 NOTE — Assessment & Plan Note (Signed)
Doxycycline  Pred taper  Cough syrup  F/u in office as needed

## 2022-10-17 DIAGNOSIS — H43393 Other vitreous opacities, bilateral: Secondary | ICD-10-CM | POA: Diagnosis not present

## 2022-10-17 DIAGNOSIS — H35033 Hypertensive retinopathy, bilateral: Secondary | ICD-10-CM | POA: Diagnosis not present

## 2022-10-17 DIAGNOSIS — H34832 Tributary (branch) retinal vein occlusion, left eye, with macular edema: Secondary | ICD-10-CM | POA: Diagnosis not present

## 2022-10-17 DIAGNOSIS — H43811 Vitreous degeneration, right eye: Secondary | ICD-10-CM | POA: Diagnosis not present

## 2023-01-02 DIAGNOSIS — H34832 Tributary (branch) retinal vein occlusion, left eye, with macular edema: Secondary | ICD-10-CM | POA: Diagnosis not present

## 2023-01-02 DIAGNOSIS — H43811 Vitreous degeneration, right eye: Secondary | ICD-10-CM | POA: Diagnosis not present

## 2023-01-02 DIAGNOSIS — H43393 Other vitreous opacities, bilateral: Secondary | ICD-10-CM | POA: Diagnosis not present

## 2023-01-02 DIAGNOSIS — H35033 Hypertensive retinopathy, bilateral: Secondary | ICD-10-CM | POA: Diagnosis not present

## 2023-01-14 DIAGNOSIS — F5101 Primary insomnia: Secondary | ICD-10-CM | POA: Diagnosis not present

## 2023-01-14 DIAGNOSIS — F330 Major depressive disorder, recurrent, mild: Secondary | ICD-10-CM | POA: Diagnosis not present

## 2023-02-11 ENCOUNTER — Encounter: Payer: Self-pay | Admitting: Family

## 2023-02-11 ENCOUNTER — Ambulatory Visit: Payer: Medicare PPO | Admitting: Family

## 2023-02-11 VITALS — BP 160/100 | HR 88 | Ht 63.0 in | Wt 158.4 lb

## 2023-02-11 DIAGNOSIS — J029 Acute pharyngitis, unspecified: Secondary | ICD-10-CM

## 2023-02-11 LAB — POCT RAPID STREP A (OFFICE): Rapid Strep A Screen: NEGATIVE

## 2023-02-11 MED ORDER — LIDOCAINE VISCOUS HCL 2 % MT SOLN: 15.0000 mL | Freq: Four times a day (QID) | OROMUCOSAL | 0 refills | Status: DC | PRN

## 2023-02-11 MED ORDER — AZITHROMYCIN 250 MG PO TABS: ORAL_TABLET | ORAL | 0 refills | Status: DC

## 2023-02-11 NOTE — Progress Notes (Signed)
Cynthia May is a 72 y.o. female with the following history as recorded in EpicCare:  Patient Active Problem List   Diagnosis Date Noted   Bronchitis 08/17/2021   Acute non-recurrent pansinusitis 08/17/2021   Osteoporosis 05/15/2020   Hypothyroidism 03/14/2018   Hyperlipidemia 03/14/2018   Hyperglycemia 03/14/2018   Preventative health care 03/14/2018   Retinal vein occlusion 08/01/2016   Abnormal brain MRI 02/28/2016   Left ankle pain 04/26/2015   CAP (community acquired pneumonia) 01/19/2015   Obstructive apnea 04/26/2014   Depression, major, recurrent (HCC) 11/09/2013   Airway hyperreactivity 03/06/2013   Essential hypertension 03/06/2013   Cannot sleep 03/06/2013    Current Outpatient Medications  Medication Sig Dispense Refill   alendronate (FOSAMAX) 70 MG tablet TAKE 1 TABLET BY MOUTH EVERY 7 DAYS ON EMPTY STOMACH WITH FULL GLASS OF WATER 12 tablet 1   atorvastatin (LIPITOR) 10 MG tablet Take 1 tablet by mouth once daily 90 tablet 1   azithromycin (ZITHROMAX Z-PAK) 250 MG tablet Take 2 tablets (500 mg) PO today, then 1 tablet (250 mg) PO daily x4 days. 6 tablet 0   DULoxetine (CYMBALTA) 60 MG capsule Take 1 capsule by mouth daily.  5   levothyroxine (SYNTHROID) 125 MCG tablet Take 1 tablet (125 mcg total) by mouth daily. 90 tablet 3   lidocaine (XYLOCAINE) 2 % solution Use as directed 15 mLs in the mouth or throat every 6 (six) hours as needed for mouth pain. 100 mL 0   lisinopril (ZESTRIL) 40 MG tablet Take 1 tablet (40 mg total) by mouth daily. 90 tablet 3   traZODone (DESYREL) 50 MG tablet Take 1 nightly for sleep     No current facility-administered medications for this visit.    Allergies: Augmentin [amoxicillin-pot clavulanate], Erythromycin, and Other  Past Medical History:  Diagnosis Date   Asthma    Depression    Environmental allergies    HTN (hypertension)    Memory loss    Pancreatitis    Retinal vessel occlusion    Thyroid disease     Past  Surgical History:  Procedure Laterality Date   CHOLECYSTECTOMY     EYE SURGERY Bilateral    Cataract sx. Dr.Beavis.   TONSILLECTOMY AND ADENOIDECTOMY      Family History  Problem Relation Age of Onset   Arthritis Mother    Heart disease Mother    Stroke Mother    Thyroid disease Mother    Alzheimer's disease Mother    Heart disease Father    Colon cancer Brother    Depression Sister    Alzheimer's disease Sister    Lung cancer Paternal Grandmother    Prostate cancer Brother    Heart disease Brother    Depression Sister        5 silbling had depression from Thyroid disease    Arthritis Sister    Lung cancer Brother    Other Sister        Creutzfeldt-Jacob Disease   Heart attack Brother     Social History   Tobacco Use   Smoking status: Former   Smokeless tobacco: Never   Tobacco comments:    Quit 1990  Substance Use Topics   Alcohol use: No    Alcohol/week: 0.0 standard drinks of alcohol    Subjective:   Sore throat x 4-5 days; no fever; notes that grandson did have strep 2 weeks ago; no cough/ chest pain; throat "just feels raw"    Objective:  Vitals:   02/11/23 1335  02/11/23 1634  BP: (!) 152/100 (!) 160/100  Pulse: 88   SpO2: 97%   Weight: 158 lb 6.4 oz (71.8 kg)   Height: 5\' 3"  (1.6 m)     General: Well developed, well nourished, in no acute distress  Skin : Warm and dry.  Head: Normocephalic and atraumatic  Eyes: Sclera and conjunctiva clear; pupils round and reactive to light; extraocular movements intact  Ears: External normal; canals clear; tympanic membranes normal  Oropharynx: Pink, supple. No suspicious lesions  Neck: Supple without thyromegaly, adenopathy  Lungs: Respirations unlabored; clear to auscultation bilaterally without wheeze, rales, rhonchi  CVS exam: normal rate and regular rhythm.  Neurologic: Alert and oriented; speech intact; face symmetrical; moves all extremities well; CNII-XII intact without focal deficit   Assessment:  1.  Sore throat     Plan:  Rapid strep is negative; based on clinical presentation, will go ahead and treat; Rx for Z-pak #1 take as directed and Rx for viscous lidocaine for symptom relief; increase fluids, rest; change toothbrush after 24 hours; follow up worse, no better.   No follow-ups on file.  Orders Placed This Encounter  Procedures   POCT rapid strep A    Requested Prescriptions   Signed Prescriptions Disp Refills   azithromycin (ZITHROMAX Z-PAK) 250 MG tablet 6 tablet 0    Sig: Take 2 tablets (500 mg) PO today, then 1 tablet (250 mg) PO daily x4 days.   lidocaine (XYLOCAINE) 2 % solution 100 mL 0    Sig: Use as directed 15 mLs in the mouth or throat every 6 (six) hours as needed for mouth pain.

## 2023-02-17 DIAGNOSIS — E039 Hypothyroidism, unspecified: Secondary | ICD-10-CM | POA: Diagnosis not present

## 2023-02-17 DIAGNOSIS — I10 Essential (primary) hypertension: Secondary | ICD-10-CM | POA: Diagnosis not present

## 2023-02-17 DIAGNOSIS — Z87891 Personal history of nicotine dependence: Secondary | ICD-10-CM | POA: Diagnosis not present

## 2023-02-17 DIAGNOSIS — Z880 Allergy status to penicillin: Secondary | ICD-10-CM | POA: Diagnosis not present

## 2023-02-17 DIAGNOSIS — F325 Major depressive disorder, single episode, in full remission: Secondary | ICD-10-CM | POA: Diagnosis not present

## 2023-02-17 DIAGNOSIS — H348392 Tributary (branch) retinal vein occlusion, unspecified eye, stable: Secondary | ICD-10-CM | POA: Diagnosis not present

## 2023-02-17 DIAGNOSIS — M810 Age-related osteoporosis without current pathological fracture: Secondary | ICD-10-CM | POA: Diagnosis not present

## 2023-02-17 DIAGNOSIS — Z8249 Family history of ischemic heart disease and other diseases of the circulatory system: Secondary | ICD-10-CM | POA: Diagnosis not present

## 2023-02-17 DIAGNOSIS — E785 Hyperlipidemia, unspecified: Secondary | ICD-10-CM | POA: Diagnosis not present

## 2023-02-25 ENCOUNTER — Other Ambulatory Visit: Payer: Self-pay | Admitting: Family Medicine

## 2023-02-25 DIAGNOSIS — I10 Essential (primary) hypertension: Secondary | ICD-10-CM

## 2023-03-03 DIAGNOSIS — H43811 Vitreous degeneration, right eye: Secondary | ICD-10-CM | POA: Diagnosis not present

## 2023-03-03 DIAGNOSIS — H43393 Other vitreous opacities, bilateral: Secondary | ICD-10-CM | POA: Diagnosis not present

## 2023-03-03 DIAGNOSIS — H34832 Tributary (branch) retinal vein occlusion, left eye, with macular edema: Secondary | ICD-10-CM | POA: Diagnosis not present

## 2023-03-03 DIAGNOSIS — H35033 Hypertensive retinopathy, bilateral: Secondary | ICD-10-CM | POA: Diagnosis not present

## 2023-03-30 ENCOUNTER — Other Ambulatory Visit: Payer: Self-pay | Admitting: Family Medicine

## 2023-03-30 DIAGNOSIS — E785 Hyperlipidemia, unspecified: Secondary | ICD-10-CM

## 2023-03-30 DIAGNOSIS — I10 Essential (primary) hypertension: Secondary | ICD-10-CM

## 2023-03-31 DIAGNOSIS — Z1231 Encounter for screening mammogram for malignant neoplasm of breast: Secondary | ICD-10-CM | POA: Diagnosis not present

## 2023-03-31 LAB — HM MAMMOGRAPHY

## 2023-04-02 ENCOUNTER — Encounter: Payer: Self-pay | Admitting: Family Medicine

## 2023-05-26 DIAGNOSIS — H43393 Other vitreous opacities, bilateral: Secondary | ICD-10-CM | POA: Diagnosis not present

## 2023-05-26 DIAGNOSIS — H34832 Tributary (branch) retinal vein occlusion, left eye, with macular edema: Secondary | ICD-10-CM | POA: Diagnosis not present

## 2023-05-26 DIAGNOSIS — H35033 Hypertensive retinopathy, bilateral: Secondary | ICD-10-CM | POA: Diagnosis not present

## 2023-05-26 DIAGNOSIS — H43811 Vitreous degeneration, right eye: Secondary | ICD-10-CM | POA: Diagnosis not present

## 2023-06-30 ENCOUNTER — Other Ambulatory Visit: Payer: Self-pay | Admitting: Family Medicine

## 2023-06-30 DIAGNOSIS — I10 Essential (primary) hypertension: Secondary | ICD-10-CM

## 2023-06-30 DIAGNOSIS — E785 Hyperlipidemia, unspecified: Secondary | ICD-10-CM

## 2023-07-11 DIAGNOSIS — F5101 Primary insomnia: Secondary | ICD-10-CM | POA: Diagnosis not present

## 2023-07-11 DIAGNOSIS — F330 Major depressive disorder, recurrent, mild: Secondary | ICD-10-CM | POA: Diagnosis not present

## 2023-07-14 DIAGNOSIS — E039 Hypothyroidism, unspecified: Secondary | ICD-10-CM | POA: Diagnosis not present

## 2023-07-14 DIAGNOSIS — R61 Generalized hyperhidrosis: Secondary | ICD-10-CM | POA: Diagnosis not present

## 2023-07-17 DIAGNOSIS — E039 Hypothyroidism, unspecified: Secondary | ICD-10-CM | POA: Diagnosis not present

## 2023-07-17 DIAGNOSIS — R61 Generalized hyperhidrosis: Secondary | ICD-10-CM | POA: Diagnosis not present

## 2023-07-21 DIAGNOSIS — H35033 Hypertensive retinopathy, bilateral: Secondary | ICD-10-CM | POA: Diagnosis not present

## 2023-07-21 DIAGNOSIS — H34832 Tributary (branch) retinal vein occlusion, left eye, with macular edema: Secondary | ICD-10-CM | POA: Diagnosis not present

## 2023-07-21 DIAGNOSIS — H43393 Other vitreous opacities, bilateral: Secondary | ICD-10-CM | POA: Diagnosis not present

## 2023-07-21 DIAGNOSIS — H43811 Vitreous degeneration, right eye: Secondary | ICD-10-CM | POA: Diagnosis not present

## 2023-07-29 ENCOUNTER — Ambulatory Visit: Payer: Medicare PPO | Admitting: Family Medicine

## 2023-08-20 ENCOUNTER — Other Ambulatory Visit: Payer: Self-pay | Admitting: Family Medicine

## 2023-08-21 ENCOUNTER — Telehealth: Payer: Self-pay | Admitting: Family Medicine

## 2023-08-21 MED ORDER — ALENDRONATE SODIUM 70 MG PO TABS: ORAL_TABLET | ORAL | 1 refills | Status: DC

## 2023-08-21 NOTE — Telephone Encounter (Signed)
Refill sent.

## 2023-08-21 NOTE — Addendum Note (Signed)
Addended by: Roxanne Gates on: 08/21/2023 01:14 PM   Modules accepted: Orders

## 2023-08-21 NOTE — Telephone Encounter (Signed)
Pt called to see why her refill was denied. Advised she needs appt. Appt made for 11/25 but pt is out of medication.

## 2023-08-21 NOTE — Telephone Encounter (Signed)
alendronate (FOSAMAX) 70 MG tablet [Pharmacy Med Name: Alendronate Sodium 70 MG Oral

## 2023-08-25 ENCOUNTER — Encounter: Payer: Self-pay | Admitting: Family Medicine

## 2023-08-25 ENCOUNTER — Ambulatory Visit: Payer: Medicare PPO | Admitting: Family Medicine

## 2023-08-25 VITALS — BP 136/88 | HR 75 | Temp 97.7°F | Resp 18 | Ht 63.0 in | Wt 156.6 lb

## 2023-08-25 DIAGNOSIS — E039 Hypothyroidism, unspecified: Secondary | ICD-10-CM | POA: Diagnosis not present

## 2023-08-25 DIAGNOSIS — E785 Hyperlipidemia, unspecified: Secondary | ICD-10-CM | POA: Diagnosis not present

## 2023-08-25 DIAGNOSIS — I10 Essential (primary) hypertension: Secondary | ICD-10-CM

## 2023-08-25 NOTE — Progress Notes (Signed)
Established Patient Office Visit  Subjective   Patient ID: Cynthia May, female    DOB: [birth year]  Age: 72 y.o. MRN: [id number]  Chief Complaint  Patient presents with   Hypertension   Hypothyroidism   Hyperlipidemia   Follow-up    HPI   Discussed the use of AI scribe software for clinical note transcription with the patient, who gave verbal consent to proceed.  History of Present Illness   The patient, with a history of hypertension, presented for a routine follow-up and medication refill. She reported feeling well with no new health concerns. She denied any symptoms of hypertension such as headaches, chest pain, or shortness of breath. The patient's blood pressure was noted to be borderline, but she does not regularly monitor it at home.  The patient's primary reason for the visit was to refill her prescription for alendronate sodium (Fosamax). There was some confusion with the pharmacy about the refill, but it was clarified that the prescription had been filled four days prior to the visit.  The patient also mentioned participating in a "healthy brain study" through [facility], which may have involved a Cologuard test within the last year. However, the patient was unsure of the exact details and the results were not available in the current medical record. The patient reported a previous negative experience with a traditional colonoscopy prep, resulting in significant illness.  The patient's bone density test was noted to be due next summer. No other health concerns or symptoms were reported during the visit.       Patient Active Problem List   Diagnosis Date Noted   Bronchitis 08/17/2021   Acute non-recurrent pansinusitis 08/17/2021   Osteoporosis 05/15/2020   Hypothyroidism 03/14/2018   Hyperlipidemia 03/14/2018   Hyperglycemia 03/14/2018   Preventative health care 03/14/2018   Retinal vein occlusion 08/01/2016   Abnormal brain MRI 02/28/2016   Left ankle pain  04/26/2015   CAP (community acquired pneumonia) 01/19/2015   Obstructive apnea 04/26/2014   Depression, major, recurrent (HCC) 11/09/2013   Airway hyperreactivity 03/06/2013   Essential hypertension 03/06/2013   Cannot sleep 03/06/2013   Past Medical History:  Diagnosis Date   Asthma    Depression    Environmental allergies    HTN (hypertension)    Memory loss    Pancreatitis    Retinal vessel occlusion    Thyroid disease    Past Surgical History:  Procedure Laterality Date   CHOLECYSTECTOMY     EYE SURGERY Bilateral    Cataract sx. Dr.Beavis.   TONSILLECTOMY AND ADENOIDECTOMY     Social History   Tobacco Use   Smoking status: Former   Smokeless tobacco: Never   Tobacco comments:    Quit 1990  Substance Use Topics   Alcohol use: No    Alcohol/week: 0.0 standard drinks of alcohol   Drug use: No   Social History   Socioeconomic History   Marital status: Widowed    Spouse name: Not on file   Number of children: 3   Years of education: Masters   Highest education level: Not on file  Occupational History   Occupation: Retired  Tobacco Use   Smoking status: Former   Smokeless tobacco: Never   Tobacco comments:    Quit 1990  Substance and Sexual Activity   Alcohol use: No    Alcohol/week: 0.0 standard drinks of alcohol   Drug use: No   Sexual activity: Never  Other Topics Concern   Not on file  Social  History Narrative   Exercise--  Pt is active during day-- no organized exercise.   Lives at home alone.   Right-handed.   1-2 diet sodas per day.   Social Determinants of Health   Financial Resource Strain: Low Risk  (06/24/2022)   Overall Financial Resource Strain (CARDIA)    Difficulty of Paying Living Expenses: Not hard at all  Food Insecurity: No Food Insecurity (06/24/2022)   Hunger Vital Sign    Worried About Running Out of Food in the Last Year: Never true    Ran Out of Food in the Last Year: Never true  Transportation Needs: No Transportation  Needs (06/24/2022)   PRAPARE - Administrator, Civil Service (Medical): No    Lack of Transportation (Non-Medical): No  Physical Activity: Sufficiently Active (06/24/2022)   Exercise Vital Sign    Days of Exercise per Week: 7 days    Minutes of Exercise per Session: 40 min  Stress: No Stress Concern Present (06/24/2022)   [employer] of Occupational Health - Occupational Stress Questionnaire    Feeling of Stress : Not at all  Social Connections: Moderately Integrated (06/24/2022)   Social Connection and Isolation Panel [NHANES]    Frequency of Communication with Friends and Family: More than three times a week    Frequency of Social Gatherings with Friends and Family: More than three times a week    Attends Religious Services: More than 4 times per year    Active Member of [employer] or Organizations: Yes    Attends Banker Meetings: More than 4 times per year    Marital Status: Widowed  Intimate Partner Violence: Not At Risk (06/24/2022)   Humiliation, Afraid, Rape, and Kick questionnaire    Fear of Current or Ex-Partner: No    Emotionally Abused: No    Physically Abused: No    Sexually Abused: No   Family Status  Relation Name Status   Mother  Deceased   Father  Deceased   Brother  (Not Specified)   Sister Laverne Alive   PGM  (Not Specified)   Brother  (Not Specified)   Brother  (Not Specified)   Sister Hydrologist   Brother  (Not Specified)   Sister Trudy Deceased   Brother  (Not Specified)  No partnership data on file   Family History  Problem Relation Age of Onset   Arthritis Mother    Heart disease Mother    Stroke Mother    Thyroid disease Mother    Alzheimer's disease Mother    Heart disease Father    Colon cancer Brother    Depression Sister    Alzheimer's disease Sister    Lung cancer Paternal Grandmother    Prostate cancer Brother    Heart disease Brother    Depression Sister        5 silbling had depression from Thyroid disease     Arthritis Sister    Lung cancer Brother    Other Sister        Creutzfeldt-Jacob Disease   Heart attack Brother    Allergies  Allergen Reactions   Augmentin [Amoxicillin-Pot Clavulanate] Other (See Comments)    Flu like symptoms   Erythromycin Other (See Comments)    Flu like symptoms   Other     Molds, trees, animal fur, grass.      Review of Systems  Constitutional:  Negative for fever and malaise/fatigue.  HENT:  Negative for congestion.   Eyes:  Negative  for blurred vision.  Respiratory:  Negative for shortness of breath.   Cardiovascular:  Negative for chest pain, palpitations and leg swelling.  Gastrointestinal:  Negative for abdominal pain, blood in stool and nausea.  Genitourinary:  Negative for dysuria and frequency.  Musculoskeletal:  Negative for falls.  Skin:  Negative for rash.  Neurological:  Negative for dizziness, loss of consciousness and headaches.  Endo/Heme/Allergies:  Negative for environmental allergies.  Psychiatric/Behavioral:  Negative for depression. The patient is not nervous/anxious.       Objective:     BP 136/88 (BP Location: Left Arm, Patient Position: Sitting, Cuff Size: Normal)   Pulse 75   Temp 97.7 F (36.5 C) (Oral)   Resp 18   Ht 5\' 3"  (1.6 m)   Wt 156 lb 9.6 oz (71 kg)   SpO2 97%   BMI 27.74 kg/m  BP Readings from Last 3 Encounters:  08/25/23 136/88  02/11/23 (!) 160/100  02/22/22 132/88   Wt Readings from Last 3 Encounters:  08/25/23 156 lb 9.6 oz (71 kg)  02/11/23 158 lb 6.4 oz (71.8 kg)  06/24/22 155 lb (70.3 kg)   SpO2 Readings from Last 3 Encounters:  08/25/23 97%  02/11/23 97%  02/22/22 97%      Physical Exam Vitals and nursing note reviewed.  Constitutional:      General: She is not in acute distress.    Appearance: Normal appearance.  Eyes:     General: No scleral icterus.       Right eye: No discharge.        Left eye: No discharge.  Pulmonary:     Effort: Pulmonary effort is normal. No  respiratory distress.  Neurological:     Mental Status: She is alert and oriented to person, place, and time.  Psychiatric:        Mood and Affect: Mood normal.        Behavior: Behavior normal.        Thought Content: Thought content normal.        Judgment: Judgment normal.      No results found for any visits on 08/25/23.  Last CBC Lab Results  Component Value Date   WBC 5.1 02/22/2022   HGB 15.0 02/22/2022   HCT 45.9 02/22/2022   MCV 89.5 02/22/2022   MCH 29.3 08/17/2021   RDW 14.0 02/22/2022   PLT 199.0 02/22/2022   Last metabolic panel Lab Results  Component Value Date   GLUCOSE 95 02/22/2022   NA 143 02/22/2022   K 4.6 02/22/2022   CL 106 02/22/2022   CO2 29 02/22/2022   BUN 17 02/22/2022   CREATININE 0.95 02/22/2022   GFR 59.86 (L) 02/22/2022   CALCIUM 9.6 02/22/2022   PROT 6.4 02/22/2022   ALBUMIN 4.3 02/22/2022   BILITOT 1.5 (H) 02/22/2022   ALKPHOS 64 02/22/2022   AST 23 02/22/2022   ALT 17 02/22/2022   Last lipids Lab Results  Component Value Date   CHOL 164 02/22/2022   HDL 62.00 02/22/2022   LDLCALC 87 02/22/2022   TRIG 76.0 02/22/2022   CHOLHDL 3 02/22/2022   Last hemoglobin A1c Lab Results  Component Value Date   HGBA1C 5.6 03/12/2018   Last thyroid functions Lab Results  Component Value Date   TSH 1.20 02/22/2022   Last vitamin D No results found for: "25OHVITD2", "25OHVITD3", "VD25OH" Last vitamin B12 and Folate Lab Results  Component Value Date   VITAMINB12 550 10/24/2017      The  10-year ASCVD risk score (Arnett DK, et al., 2019) is: 18.5%    Assessment & Plan:   Problem List Items Addressed This Visit       Unprioritized   Hypothyroidism   Relevant Orders   TSH   Hyperlipidemia - Primary   Relevant Orders   Comprehensive metabolic panel   Lipid panel   Other Visit Diagnoses     Primary hypertension       Relevant Orders   CBC with Differential/Platelet   Comprehensive metabolic panel   Lipid panel       Assessment and Plan    Osteoporosis   She is here for a refill of alendronate sodium (Fayosim), which was filled four days ago but has not yet been picked up. Her bone density scan is due next summer. We will refill the alendronate sodium prescription and schedule a bone density scan for next summer.  Hypertension   Her blood pressure is borderline, and she does not check her blood pressure at home. We will monitor her blood pressure regularly at home.  Colorectal Cancer Screening   She reports completing a Cologuard test within the last year, likely through a healthy brain study at [facility], but no record of the test is found in the current chart. We will confirm the Cologuard test completion with the [facility] healthy brain study and document the Cologuard test results if confirmed.  General Health Maintenance   Her bone density scan is up to date, with the next one due next summer. No recent colonoscopy or Cologuard test is documented in the chart. We will order labs including thyroid function tests and perform routine lab work for medication monitoring.       No follow-ups on file.    Donato Schultz, DO

## 2023-08-26 LAB — COMPREHENSIVE METABOLIC PANEL
ALT: 12 U/L (ref 0–35)
AST: 20 U/L (ref 0–37)
Albumin: 4.1 g/dL (ref 3.5–5.2)
Alkaline Phosphatase: 63 U/L (ref 39–117)
BUN: 21 mg/dL (ref 6–23)
CO2: 31 meq/L (ref 19–32)
Calcium: 9.8 mg/dL (ref 8.4–10.5)
Chloride: 106 meq/L (ref 96–112)
Creatinine, Ser: 0.99 mg/dL (ref 0.40–1.20)
GFR: 56.37 mL/min — ABNORMAL LOW (ref >60.00)
Glucose, Bld: 88 mg/dL (ref 70–99)
Potassium: 5.1 meq/L (ref 3.5–5.1)
Sodium: 143 meq/L (ref 135–145)
Total Bilirubin: 1.1 mg/dL (ref 0.2–1.2)
Total Protein: 6.2 g/dL (ref 6.0–8.3)

## 2023-08-26 LAB — LIPID PANEL
Cholesterol: 134 mg/dL (ref 0–200)
HDL: 50.6 mg/dL (ref >39.00)
LDL Cholesterol: 64 mg/dL (ref 0–99)
NonHDL: 83.33
Total CHOL/HDL Ratio: 3
Triglycerides: 98 mg/dL (ref 0.0–149.0)
VLDL: 19.6 mg/dL (ref 0.0–40.0)

## 2023-08-26 LAB — CBC WITH DIFFERENTIAL/PLATELET
Basophils Absolute: 0 10*3/uL (ref 0.0–0.1)
Basophils Relative: 0.2 % (ref 0.0–3.0)
Eosinophils Absolute: 0.3 10*3/uL (ref 0.0–0.7)
Eosinophils Relative: 4.5 % (ref 0.0–5.0)
HCT: 47.5 % — ABNORMAL HIGH (ref 36.0–46.0)
Hemoglobin: 15.2 g/dL — ABNORMAL HIGH (ref 12.0–15.0)
Lymphocytes Relative: 28.1 % (ref 12.0–46.0)
Lymphs Abs: 1.8 10*3/uL (ref 0.7–4.0)
MCHC: 32.1 g/dL (ref 30.0–36.0)
MCV: 92.6 fL (ref 78.0–100.0)
Monocytes Absolute: 0.4 10*3/uL (ref 0.1–1.0)
Monocytes Relative: 6.1 % (ref 3.0–12.0)
Neutro Abs: 4 10*3/uL (ref 1.4–7.7)
Neutrophils Relative %: 61.1 % (ref 43.0–77.0)
Platelets: 213 10*3/uL (ref 150.0–400.0)
RBC: 5.13 Mil/uL — ABNORMAL HIGH (ref 3.87–5.11)
RDW: 14.4 % (ref 11.5–15.5)
WBC: 6.6 10*3/uL (ref 4.0–10.5)

## 2023-08-26 LAB — TSH: TSH: 1.38 u[IU]/mL (ref 0.35–5.50)

## 2023-09-15 DIAGNOSIS — H35033 Hypertensive retinopathy, bilateral: Secondary | ICD-10-CM | POA: Diagnosis not present

## 2023-09-15 DIAGNOSIS — H34832 Tributary (branch) retinal vein occlusion, left eye, with macular edema: Secondary | ICD-10-CM | POA: Diagnosis not present

## 2023-09-15 DIAGNOSIS — H43811 Vitreous degeneration, right eye: Secondary | ICD-10-CM | POA: Diagnosis not present

## 2023-09-15 DIAGNOSIS — H43393 Other vitreous opacities, bilateral: Secondary | ICD-10-CM | POA: Diagnosis not present

## 2023-10-04 ENCOUNTER — Other Ambulatory Visit: Payer: Self-pay | Admitting: Family Medicine

## 2023-10-04 DIAGNOSIS — I10 Essential (primary) hypertension: Secondary | ICD-10-CM

## 2023-10-04 DIAGNOSIS — E785 Hyperlipidemia, unspecified: Secondary | ICD-10-CM

## 2023-11-10 DIAGNOSIS — H43811 Vitreous degeneration, right eye: Secondary | ICD-10-CM | POA: Diagnosis not present

## 2023-11-10 DIAGNOSIS — H43393 Other vitreous opacities, bilateral: Secondary | ICD-10-CM | POA: Diagnosis not present

## 2023-11-10 DIAGNOSIS — H34832 Tributary (branch) retinal vein occlusion, left eye, with macular edema: Secondary | ICD-10-CM | POA: Diagnosis not present

## 2023-11-10 DIAGNOSIS — H35033 Hypertensive retinopathy, bilateral: Secondary | ICD-10-CM | POA: Diagnosis not present

## 2023-11-11 ENCOUNTER — Telehealth: Payer: Self-pay | Admitting: Family Medicine

## 2023-11-11 NOTE — Telephone Encounter (Signed)
Copied from CRM [id number]. Topic: Clinical - Request for Lab/Test Order >> Nov 11, 2023  2:58 PM Alvino Blood C wrote: Reason for CRM: Patient is asking is provider Laury Axon would know where she can get [facility] Genetic testing done at.

## 2023-11-12 ENCOUNTER — Other Ambulatory Visit: Payer: Self-pay | Admitting: Family Medicine

## 2023-11-12 ENCOUNTER — Encounter: Payer: Self-pay | Admitting: Family Medicine

## 2023-11-13 ENCOUNTER — Other Ambulatory Visit: Payer: Self-pay | Admitting: Family Medicine

## 2023-11-13 DIAGNOSIS — Z803 Family history of malignant neoplasm of breast: Secondary | ICD-10-CM

## 2023-11-13 NOTE — Telephone Encounter (Signed)
Spoke with patient. Pt states she was advised to get genetic testing due to her daughter recently being dx with breast cancer

## 2023-11-14 NOTE — Telephone Encounter (Signed)
Pt called. LVM advising below

## 2023-11-19 ENCOUNTER — Telehealth: Payer: Self-pay | Admitting: Genetic Counselor

## 2023-11-19 NOTE — Telephone Encounter (Signed)
 LM on VM that I had called to try to answer questions.  Left CB number.

## 2023-11-27 ENCOUNTER — Other Ambulatory Visit: Payer: Self-pay | Admitting: Family Medicine

## 2023-11-27 DIAGNOSIS — Z803 Family history of malignant neoplasm of breast: Secondary | ICD-10-CM

## 2023-12-01 ENCOUNTER — Encounter: Payer: Self-pay | Admitting: Genetic Counselor

## 2023-12-01 ENCOUNTER — Inpatient Hospital Stay

## 2023-12-01 ENCOUNTER — Inpatient Hospital Stay: Attending: Genetic Counselor | Admitting: Genetic Counselor

## 2023-12-01 DIAGNOSIS — Z803 Family history of malignant neoplasm of breast: Secondary | ICD-10-CM

## 2023-12-01 DIAGNOSIS — Z8481 Family history of carrier of genetic disease: Secondary | ICD-10-CM

## 2023-12-01 DIAGNOSIS — Z1379 Encounter for other screening for genetic and chromosomal anomalies: Secondary | ICD-10-CM | POA: Diagnosis not present

## 2023-12-01 LAB — GENETIC SCREENING ORDER

## 2023-12-01 NOTE — Progress Notes (Signed)
 REFERRING PROVIDER: Donato Schultz, DO [address] [place],  [place] [address]  PRIMARY PROVIDER:  Zola Button, Grayling Congress, DO  PRIMARY REASON FOR VISIT:  1. Family history of malignant neoplasm of breast   2. Family history of BRCA gene mutation      HISTORY OF PRESENT ILLNESS:   Cynthia May, a 73 y.o. female, was seen for a [place] cancer genetics consultation at the request of Dr. Zola Button due to a family history of cancer.  Cynthia May presents to clinic today to discuss the possibility of a hereditary predisposition to cancer, genetic testing, and to further clarify her future cancer risks, as well as potential cancer risks for family members.   Cynthia May is a 73 y.o. female with no personal history of cancer.    Of note, she does report a history of mild cognitive impairment and reports participating in the "Healthy Brain" study at [facility].   RISK FACTORS:  Menarche was at age 53-11.  First live birth at age 57.  OCP use for approximately 6 years.  Ovaries intact: yes.  Hysterectomy: no.  Menopausal status: postmenopausal at age [age].   HRT use: 0 years. Colonoscopy: yes;  reports no polyps detected . Mammogram within the last year: yes, 03/2023, heterogeneously dense tissue detected.  Number of breast biopsies: 0. Up to date with pelvic exams: yes. Any excessive radiation exposure in the past: reports treatment with radioactive iodine due to an overactive thyroid.   Past Medical History:  Diagnosis Date   Asthma    Depression    Environmental allergies    HTN (hypertension)    Memory loss    Pancreatitis    Retinal vessel occlusion    Thyroid disease     Past Surgical History:  Procedure Laterality Date   CHOLECYSTECTOMY     EYE SURGERY Bilateral    Cataract sx. Dr.Beavis.   TONSILLECTOMY AND ADENOIDECTOMY      Social History   Socioeconomic History   Marital status: Widowed    Spouse name:  Not on file   Number of children: 3   Years of education: Masters   Highest education level: Not on file  Occupational History   Occupation: Retired  Tobacco Use   Smoking status: Former   Smokeless tobacco: Never   Tobacco comments:    Quit 1990  Substance and Sexual Activity   Alcohol use: No    Alcohol/week: 0.0 standard drinks of alcohol   Drug use: No   Sexual activity: Never  Other Topics Concern   Not on file  Social History Narrative   Exercise--  Pt is active during day-- no organized exercise.   Lives at home alone.   Right-handed.   1-2 diet sodas per day.   Social Drivers of Corporate investment banker Strain: Low Risk  (06/24/2022)   Overall Financial Resource Strain (CARDIA)    Difficulty of Paying Living Expenses: Not hard at all  Food Insecurity: No Food Insecurity (06/24/2022)   Hunger Vital Sign    Worried About Running Out of Food in the Last Year: Never true    Ran Out of Food in the Last Year: Never true  Transportation Needs: No Transportation Needs (06/24/2022)   PRAPARE - Administrator, Civil Service (Medical): No    Lack of Transportation (Non-Medical): No  Physical Activity: Sufficiently Active (06/24/2022)   Exercise Vital Sign    Days of  Exercise per Week: 7 days    Minutes of Exercise per Session: 40 min  Stress: No Stress Concern Present (06/24/2022)   [employer] of Occupational Health - Occupational Stress Questionnaire    Feeling of Stress : Not at all  Social Connections: Moderately Integrated (06/24/2022)   Social Connection and Isolation Panel [NHANES]    Frequency of Communication with Friends and Family: More than three times a week    Frequency of Social Gatherings with Friends and Family: More than three times a week    Attends Religious Services: More than 4 times per year    Active Member of [employer] or Organizations: Yes    Attends Banker Meetings: More than 4 times per year    Marital Status: Widowed      FAMILY HISTORY:  We obtained a detailed, 4-generation family history.  Significant diagnoses are listed below: Family History  Problem Relation Age of Onset   Arthritis Mother    Heart disease Mother    Stroke Mother    Thyroid disease Mother    Alzheimer's disease Mother    Heart disease Father    Depression Sister    Alzheimer's disease Sister    Depression Sister        5 silbling had depression from Thyroid disease    Arthritis Sister    Dementia Sister    Other Sister        Creutzfeldt-Jacob Disease   Prostate cancer Brother    Prostate cancer Brother    Heart disease Brother    Heart attack Brother    Throat cancer Paternal Grandmother    Leukemia Cousin 12   Cancer Nephew    Cancer Nephew    Breast cancer Niece        "BRCA1"   Breast cancer Daughter        "neg genetics"    Cynthia May reports her daughter, age 86, was recently diagnosed with breast cancer and had negative genetic testing. She reports two nieces diagnosed with a BRCA1 mutation, one with breast cancer in her late 30s-early 77s and one that has not had cancer but underwent preventative mastectomy. She does not have copies of her nieces test reports, but will try to obtain copies to share with our office. She reports four nephews diagnosed with unknown cancer in their 15s, one is living and three have passed away. She reports two brothers diagnosed with prostate cancer in their mid to late 86s, living. She reports a paternal uncle diagnosed with an unknown cancer in his 68s, deceased. She reports her paternal grandmother diagnosed with throat cancer, deceased. She reports a maternal first cousin diagnosed with leukemia at age 67, deceased. There is no reported Ashkenazi Jewish ancestry. There is no known consanguinity.  Additionally, Cynthia May does report a sister diagnosed with Creuzfeldt-Jacobs disease that passed away in her 71s. She reports that her sister worked as an Charity fundraiser and it was  theorized that she might have been exposed to a contaminated biologic specimen, but cause of disease was unknown. She reports a sister diagnosed with dementia that had "trouble walking" and "jerky movements" that passed away in her 28s. She reports another sister and her mother diagnosed with Alzheimer's disease that passed away in their 62s. She does not report any family members that have been evaluated for hereditary neurodegenerative diseases.     GENETIC COUNSELING ASSESSMENT: Cynthia May is a 73 y.o. female with a family history of cancer and family history of  a BRCA1 mutation, which increases Cynthia May's risk for a hereditary cancer syndrome. We, therefore, discussed and recommended the following at today's visit.   DISCUSSION: We discussed that, in general, most cancer is not inherited in families, but instead is sporadic or familial. Sporadic cancers occur by chance and typically happen at older ages (>50 years) as this type of cancer is caused by genetic changes acquired during an individual's lifetime. Some families have more cancers than would be expected by chance; however, the ages or types of cancer are not consistent with a known genetic mutation or known genetic mutations have been ruled out. This type of familial cancer is thought to be due to a combination of multiple genetic, environmental, hormonal, and lifestyle factors. While this combination of factors likely increases the risk of cancer, the exact source of this risk is not currently identifiable or testable.  We discussed that 5 - 10% of cancer is hereditary. We discussed that BRCA1 mutations are associated with hereditary breast and ovarian cancer syndrome (increased risks for breast, ovarian, prostate and pancreatic cancers) and is inherited in an autosomal dominant pattern. We estimate Cynthia May has a 25% chance to have the familial BRCA1 mutation identified for her nieces. We discussed risks and recommendations  for people known to carry a BRCA1 mutation per NCCN. We discussed that testing is beneficial for several reasons including knowing how to screen individuals for cancer, identifying preventative measures to decrease the risk for cancer, and to understand if other family members could be at risk for cancer and allow them to undergo genetic testing.   We reviewed the characteristics, features and inheritance patterns of hereditary cancer syndromes. We also discussed genetic testing, including the appropriate family members to test, the process of testing, insurance coverage and turn-around-time for results. We discussed the implications of a negative, positive, carrier and/or variant of uncertain significant result. Cynthia May  was offered a common hereditary cancer panel (36+ genes) and an expanded pan-cancer panel (70+ genes). Cynthia May was informed of the benefits and limitations of each panel, including that expanded pan-cancer panels contain genes that do not have clear management guidelines at this point in time.  We also discussed that as the number of genes included on a panel increases, the chances of variants of uncertain significance increases. Cynthia May decided to pursue genetic testing for the [facility] CancerNext-Expanded +RNAinsight 76 gene panel. The CancerNext-Expanded gene panel offered by [facility] and includes sequencing, rearrangement, and RNA analysis for the following 76 genes: AIP, ALK, APC, ATM, AXIN2, BAP1, BARD1, BMPR1A, BRCA1, BRCA2, BRIP1, CDC73, CDH1, CDK4, CDKN1B, CDKN2A, CEBPA, CHEK2, CTNNA1, DDX41, DICER1, ETV6, FH, FLCN, GATA2, LZTR1, MAX, MBD4, MEN1, MET, MLH1, MSH2, MSH3, MSH6, MUTYH, NF1, NF2, NTHL1, PALB2, PHOX2B, PMS2, POT1, PRKAR1A, PTCH1, PTEN, RAD51C, RAD51D, RB1, RET, RUNX1, SDHA, SDHAF2, SDHB, SDHC, SDHD, SMAD4, SMARCA4, SMARCB1, SMARCE1, STK11, SUFU, TMEM127, TP53, TSC1, TSC2, VHL, and WT1 (sequencing and deletion/duplication); EGFR, HOXB13, KIT, MITF,  PDGFRA, POLD1, and POLE (sequencing only); EPCAM and GREM1 (deletion/duplication only).   Based on Cynthia May's family history of cancer, she meets medical criteria for genetic testing.  Despite that she meets criteria, she may still have an out of pocket cost. We discussed that if her out of pocket cost for testing is over $100, the laboratory will call and confirm whether she wants to proceed with testing.  If the out of pocket cost of testing is less than $100 she will be billed by the genetic testing laboratory.  We discussed that some people do not want to undergo genetic testing due to fear of genetic discrimination.  The Genetic Information Nondiscrimination Act (GINA) was signed into federal law in 2008. GINA prohibits health insurers and most employers from discriminating against individuals based on genetic information (including the results of genetic tests and family history information). According to GINA, health insurance companies cannot consider genetic information to be a preexisting condition, nor can they use it to make decisions regarding coverage or rates. GINA also makes it illegal for most employers to use genetic information in making decisions about hiring, firing, promotion, or terms of employment. It is important to note that GINA does not offer protections for life insurance, disability insurance, or long-term care insurance. GINA does not apply to those in the [employer], those who work for companies with less than 15 employees, and new life insurance or long-term disability insurance policies.  Health status due to a cancer diagnosis is not protected under GINA. More information about GINA can be found by visiting [email or web address].  We reviewed the characteristics, features and inheritance patterns of hereditary cancer syndromes. We also discussed genetic testing, including the appropriate family members to test, the process of testing, insurance coverage and turn-around-time  for results. We discussed the implications of a negative, positive and/or variant of uncertain significant result.   PLAN: After considering the risks, benefits, and limitations, Cynthia May provided informed consent to pursue genetic testing and the blood sample was sent to [facility] for analysis of the CancerNext-Expanded +RNAinsight panel. Results should be available within approximately 2-3 weeks' time, at which point they will be disclosed by telephone to Cynthia May, as will any additional recommendations warranted by these results. Cynthia May will receive a summary of her genetic counseling visit and a copy of her results once available. This information will also be available in Epic.   Cynthia May will try to obtain copies of familial test reports to share with our office.   We will identify options for referrals to adult genetics that can evaluate for hereditary neurologic disorders in case Cynthia May decides she is interested in further exploring genetic testing for the family history of neurologic disorders.   Lastly, we encouraged Ms. Ratterree to remain in contact with cancer genetics annually so that we can continuously update the family history and inform her of any changes in cancer genetics and testing that may be of benefit for this family.   Ms. Bostrom's questions were answered to her satisfaction today. Our contact information was provided should additional questions or concerns arise. Thank you for the referral and allowing [place] to share in the care of your patient.   Vassie Moment, MS, [facility] Licensed, Retail [email or web address] phone: [phone]  55 minutes were spent on the date of the encounter in service to the patient including preparation, face-to-face consultation, documentation and care coordination.  The patient was seen alone.  Drs. Meliton Rattan, and/or [place] were available for questions, if  needed..   _______________________________________________________________________ For Office Staff:  Number of people involved in session: 1 Was an Intern/ student involved with case: no

## 2023-12-24 ENCOUNTER — Telehealth: Payer: Self-pay | Admitting: Genetic Counselor

## 2023-12-24 ENCOUNTER — Ambulatory Visit: Payer: Self-pay | Admitting: Genetic Counselor

## 2023-12-24 DIAGNOSIS — Z1379 Encounter for other screening for genetic and chromosomal anomalies: Secondary | ICD-10-CM | POA: Insufficient documentation

## 2023-12-24 NOTE — Telephone Encounter (Signed)
 I spoke to Cynthia May to review results of genetic testing. she had genetic testing with [facility] CancerNext-Expanded +RNAinsight panel. Testing did not identify any variants known to increase the risk for cancer.  Discussed that we do not know why she has why there is cancer in the family. It could be due to a different gene that we are not testing, or maybe our current technology may not be able to pick something up.  It will be important for her to keep in contact with genetics to keep up with whether additional testing may be needed.  Discussed option for referral to medical genetics due to the family history of Cruezfeldt Jacob's disease and other neurologic disorders. She declined at this time, plans to follow up with [facility] through the healthy brain study.   Please see counseling note for further detail on this result.

## 2023-12-24 NOTE — Telephone Encounter (Signed)
 LVM regarding breast cancer risk model, elevated 10 year risk. Called to discuss options, left message asking for call back. Information included in result summary.

## 2023-12-24 NOTE — Progress Notes (Signed)
 HPI:  Cynthia May was previously seen in the [facility] due to a family history of cancer, reported family history of a BRCA1 mutation and concerns regarding a hereditary predisposition to cancer. Please refer to our prior cancer genetics clinic note for more information regarding our discussion, assessment and recommendations, at the time. Ms. Cynthia May recent genetic test results were disclosed to her, as were recommendations warranted by these results. These results and recommendations are discussed in more detail below.   FAMILY HISTORY:  We obtained a detailed, 4-generation family history.  Significant diagnoses are listed below: Family History  Problem Relation Age of Onset   Arthritis Mother    Heart disease Mother    Stroke Mother    Thyroid disease Mother    Alzheimer's disease Mother    Heart disease Father    Depression Sister    Alzheimer's disease Sister    Depression Sister        5 silbling had depression from Thyroid disease    Arthritis Sister    Dementia Sister    Other Sister        Creutzfeldt-Jacob Disease   Prostate cancer Brother    Prostate cancer Brother    Heart disease Brother    Heart attack Brother    Throat cancer Paternal Grandmother    Leukemia Cousin 12   Cancer Nephew    Cancer Nephew    Breast cancer Niece        "BRCA1"   Breast cancer Daughter        "neg genetics"    Cynthia May reports her daughter, age 60, was recently diagnosed with breast cancer and had negative genetic testing. She reports two nieces diagnosed with a BRCA1 mutation, one with breast cancer in her late 30s-early 24s and one that has not had cancer but underwent preventative mastectomy. She does not have copies of her nieces test reports, but will try to obtain copies to share with our office. She reports four nephews diagnosed with unknown cancer in their 65s, one is living and three have passed away. She reports two brothers diagnosed  with prostate cancer in their mid to late 63s, living. She reports a paternal uncle diagnosed with an unknown cancer in his 61s, deceased. She reports her paternal grandmother diagnosed with throat cancer, deceased. She reports a maternal first cousin diagnosed with leukemia at age 69, deceased. There is no reported Ashkenazi Jewish ancestry. There is no known consanguinity.   Additionally, Cynthia May does report a sister diagnosed with Creuzfeldt-Jacobs disease that passed away in her 28s. She reports that her sister worked as an Charity fundraiser and it was theorized that she might have been exposed to a contaminated biologic specimen, but cause of disease was unknown. She reports a sister diagnosed with dementia that had "trouble walking" and "jerky movements" that passed away in her 36s. She reports another sister and her mother diagnosed with Alzheimer's disease that passed away in their 56s. She does not report any family members that have been evaluated for hereditary neurodegenerative diseases.      GENETIC TEST RESULTS: Genetic testing reported out on 12/17/23 through the [facility] CancerNext-Expanded +RNAinsight panel found no pathogenic mutations. The CancerNext-Expanded gene panel offered by [facility] and includes sequencing, rearrangement, and RNA analysis for the following 76 genes: AIP, ALK, APC, ATM, AXIN2, BAP1, BARD1, BMPR1A, BRCA1, BRCA2, BRIP1, CDC73, CDH1, CDK4, CDKN1B, CDKN2A, CEBPA, CHEK2, CTNNA1, DDX41, DICER1, ETV6, FH, FLCN, GATA2, LZTR1, MAX, MBD4, MEN1, MET,  MLH1, MSH2, MSH3, MSH6, MUTYH, NF1, NF2, NTHL1, PALB2, PHOX2B, PMS2, POT1, PRKAR1A, PTCH1, PTEN, RAD51C, RAD51D, RB1, RET, RUNX1, SDHA, SDHAF2, SDHB, SDHC, SDHD, SMAD4, SMARCA4, SMARCB1, SMARCE1, STK11, SUFU, TMEM127, TP53, TSC1, TSC2, VHL, and WT1 (sequencing and deletion/duplication); EGFR, HOXB13, KIT, MITF, PDGFRA, POLD1, and POLE (sequencing only); EPCAM and GREM1 (deletion/duplication only). The test report has been scanned into  EPIC and is located under the Molecular Pathology section of the Results Review tab.  A portion of the result report is included below for reference.     We discussed with Cynthia May that because current genetic testing is not perfect, it is possible there may be a gene mutation in one of these genes that current testing cannot detect, but that chance is small.  We also discussed, that there could be another gene that has not yet been discovered, or that we have not yet tested, that is responsible for the cancer diagnoses in the family. It is also possible there is a hereditary cause for the cancer in the family that Cynthia May did not inherit and therefore was not identified in her testing.  Therefore, it is important to remain in touch with cancer genetics in the future so that we can continue to offer Ms. Dubuque the most up to date genetic testing.   We discussed that it is reassuring that testing did not identify any pathogenic or likely pathogenic mutations in the BRCA1 gene, as her two nieces are reported to be BRCA1 positive. However, we did not have copies of her nieces test report or information on the specific mutation. Therefore, there is a small possibility that the familial mutation could have been missed. We encouraged Cynthia May to contact our office if she is able to obtain any additional information from her nieces regarding their specific test results.   ADDITIONAL GENETIC TESTING: We discussed with Cynthia May that her genetic testing was fairly extensive.  If there are genes identified to increase cancer risk that can be analyzed in the future, we would be happy to discuss and coordinate this testing at that time.    We also discussed a referral to Medical Genetics to further discuss genetic testing for neurologic disorders, given her family history of Creuzfeldt-Jacobs disease, dementia and Alzheimers. She has been participating in the Health Brains Study at  [facility] for several years   CANCER SCREENING RECOMMENDATIONS: [name]'s test result is considered negative (normal).  This means that we have not identified a hereditary cause for her family history of cancer at this time. Most cancers happen by chance and this negative test suggests that her family history of cancer may fall into this category.    Possible reasons for Ms. Pitones's negative genetic test include:  1. There may be a gene mutation in one of these genes that current testing methods cannot detect but that chance is small.  2. There could be another gene that has not yet been discovered, or that we have not yet tested, that is responsible for the cancer diagnoses in the family.  3.  There may be no hereditary risk for cancer in the family. The cancers in Ms. Concepcion and/or her family may be sporadic/familial or due to other genetic and environmental factors. 4. It is also possible there is a hereditary cause for the cancer in the family that Ms. Petzold did not inherit.  Therefore, it is recommended she continue to follow the cancer management and screening guidelines provided by her primary healthcare  provider. An individual's cancer risk and medical management are not determined by genetic test results alone. Overall cancer risk assessment incorporates additional factors, including personal medical history, family history, and any available genetic information that may result in a personalized plan for cancer prevention and surveillance  Given Ms. Neitzke's family histories, we must interpret these negative results with some caution.  Families with features suggestive of hereditary risk for cancer tend to have multiple family members with cancer, diagnoses in multiple generations and diagnoses before the age of 31. Ms. Sonnen family exhibits some of these features. Thus, this result may simply reflect our current inability to detect all mutations within  these genes or there may be a different gene that has not yet been discovered or tested.   An individual's cancer risk and medical management are not determined by genetic test results alone. Overall cancer risk assessment incorporates additional factors, including personal medical history, family history, and any available genetic information that may result in a personalized plan for cancer prevention and surveillance.  Based on Ms. Zakrzewski's family history of breast cancer, genetic test results and statistical models, we calculated a lifetime risk for breast cancer. The patient's lifetime breast cancer risk is a preliminary estimate based on available information using one of several models endorsed by the [employer] (NCCN).  The NCCN recommends consideration of breast MRI screening as an adjunct to mammography for patients at high risk (defined as 20% or greater lifetime risk) and recommends consideration of risk reducing agents when 10 year risk is greater than 5% or 5 year risk is greater than 1.7%. Please note that a woman's breast cancer risk changes over time. It may increase or decrease based on age and any changes to the personal and/or family medical history. The risks and recommendations listed above apply to this patient at this point in time. In the future, she may or may not be eligible for the same medical management strategies and, in some cases, other medical management strategies may become available to her. If she is interested in an updated breast cancer risk assessment at a later date, she can contact us.   Ms. Tetzlaff has been determined to be at increased risk for breast cancer. Lifetime breast cancer risk calculated at 8.6% (gail model) - 11.3% Rockne Menghini), 10 year risk 10.5% Rockne Menghini) and 5 year risk of 3.8% Dondra Spry).  For women with a greater than 5% 10 year risk of breast cancer, NCCN recommends:  Breast awareness       Clinical encounter  every 6-12 months to begin when identified as being at increased risk, but not before age 82  Annual mammograms. Tomosynthesis is recommended starting 10 years earlier than the youngest breast cancer diagnosis in the family or at age 44 (whichever comes first), but not before age 6   Consideration of risk reducing agents   It is reasonable for Ms. Berko to be followed by a high-risk breast cancer clinic; in addition to a yearly mammogram and physical exam by a healthcare provider, she could discuss the usefulness of risk reducing agents with the high-risk clinic providers.    RECOMMENDATIONS FOR FAMILY MEMBERS:  Individuals in this family might be at some increased risk of developing cancer, over the general population risk, simply due to the family history of cancer.  We recommended women in this family have a yearly mammogram beginning at age 77, or 59 years younger than the earliest onset of cancer, an annual clinical breast  exam, and perform monthly breast self-exams. Women in this family should also have a gynecological exam as recommended by their primary provider. All family members should be referred for colonoscopy starting at age 81, or 30 years younger than the earliest onset of cancer.  It is also possible there is a hereditary cause for the cancer in Ms. Kasprzak's family that she did not inherit and therefore was not identified in her.   FOLLOW-UP: Lastly, we discussed with Ms. Rennels that cancer genetics is a rapidly advancing field and it is possible that new genetic tests will be appropriate for her and/or her family members in the future. We encouraged her to remain in contact with cancer genetics on an annual basis so we can update her personal and family histories and let her know of advances in cancer genetics that may benefit this family.   Our contact number was provided. Ms. Glorioso's questions were answered to her satisfaction, and she knows she is welcome to  call us at anytime with additional questions or concerns.   Vassie Moment, MS, [facility] Licensed, Retail [email or web address] [phone]

## 2023-12-29 DIAGNOSIS — F330 Major depressive disorder, recurrent, mild: Secondary | ICD-10-CM | POA: Diagnosis not present

## 2023-12-29 DIAGNOSIS — F5101 Primary insomnia: Secondary | ICD-10-CM | POA: Diagnosis not present

## 2024-01-05 ENCOUNTER — Other Ambulatory Visit: Payer: Self-pay | Admitting: Family Medicine

## 2024-01-05 DIAGNOSIS — H43811 Vitreous degeneration, right eye: Secondary | ICD-10-CM | POA: Diagnosis not present

## 2024-01-05 DIAGNOSIS — I10 Essential (primary) hypertension: Secondary | ICD-10-CM

## 2024-01-05 DIAGNOSIS — H35033 Hypertensive retinopathy, bilateral: Secondary | ICD-10-CM | POA: Diagnosis not present

## 2024-01-05 DIAGNOSIS — H34832 Tributary (branch) retinal vein occlusion, left eye, with macular edema: Secondary | ICD-10-CM | POA: Diagnosis not present

## 2024-01-05 DIAGNOSIS — H43393 Other vitreous opacities, bilateral: Secondary | ICD-10-CM | POA: Diagnosis not present

## 2024-01-05 DIAGNOSIS — E785 Hyperlipidemia, unspecified: Secondary | ICD-10-CM

## 2024-01-09 ENCOUNTER — Telehealth: Payer: Self-pay | Admitting: Genetic Counselor

## 2024-01-09 NOTE — Telephone Encounter (Signed)
 Returning call from Cynthia May with questions about bill from genetic testing. She received a bill from [facility] for $250, first she had heard regarding the testing not being covered. She was not notified in advance about any out of pocket cost over $100.   Provided her with the billing number for [facility] and encouraged her to reach out to them directly. I will contact our [facility] rep to obtain further information.   Additionally, she talked to the study at [facility] about genetic testing for her family history and was able to obtain further information about testing that has been completed.

## 2024-01-26 ENCOUNTER — Encounter: Payer: Medicare PPO | Admitting: Genetic Counselor

## 2024-01-26 ENCOUNTER — Other Ambulatory Visit: Payer: Medicare PPO

## 2024-02-06 ENCOUNTER — Other Ambulatory Visit: Payer: Self-pay | Admitting: Family Medicine

## 2024-02-06 MED ORDER — ALENDRONATE SODIUM 70 MG PO TABS: ORAL_TABLET | ORAL | 1 refills | Status: DC

## 2024-02-06 NOTE — Telephone Encounter (Signed)
 Copied from CRM [id number]. Topic: Clinical - Medication Question >> Feb 06, 2024 11:23 AM Allyne Areola wrote: Reason for CRM: Patient submitted a refill request through her pharmacy for alendronate  (FOSAMAX ) 70 MG tablet  on Wednesday. She called to follow up but they informed her that nothing has been received. She is out and did not want to go through the weekend without this medication.

## 2024-03-01 DIAGNOSIS — H34832 Tributary (branch) retinal vein occlusion, left eye, with macular edema: Secondary | ICD-10-CM | POA: Diagnosis not present

## 2024-03-01 DIAGNOSIS — H35033 Hypertensive retinopathy, bilateral: Secondary | ICD-10-CM | POA: Diagnosis not present

## 2024-03-01 DIAGNOSIS — H43811 Vitreous degeneration, right eye: Secondary | ICD-10-CM | POA: Diagnosis not present

## 2024-03-01 DIAGNOSIS — H43393 Other vitreous opacities, bilateral: Secondary | ICD-10-CM | POA: Diagnosis not present

## 2024-03-24 ENCOUNTER — Other Ambulatory Visit: Payer: Self-pay | Admitting: Family Medicine

## 2024-03-24 DIAGNOSIS — G9389 Other specified disorders of brain: Secondary | ICD-10-CM

## 2024-03-25 ENCOUNTER — Telehealth: Payer: Self-pay

## 2024-03-25 NOTE — Telephone Encounter (Signed)
 Attempted to return call and was unsucceful with talking to someone. Pt did not have imaging here.

## 2024-03-25 NOTE — Telephone Encounter (Signed)
 Copied from CRM [id number]. Topic: Referral - Question >> Mar 25, 2024  8:37 AM Burnard DEL wrote: Reason for RMF:[facility] called in stating that they received referral for patient and they are needing office visit notes,change in clinical status etc worsening or new symptoms, interval reassessment  treatment plan, prior imaging results of specific area or structure of same imaging .  Fax#[phone]  Tracking#:[id number] Information should be sent in 4 hours,if not able to send call [phone] to schedule peer to peer,if not the MD will move forward with a decision.

## 2024-03-31 ENCOUNTER — Telehealth: Payer: Self-pay | Admitting: Family Medicine

## 2024-03-31 NOTE — Telephone Encounter (Signed)
 Copied from CRM [id number]. Topic: Referral - Prior Authorization Question >> Mar 31, 2024 10:44 AM Lavanda D wrote: Reason for CRM: Abby with [employer] is calling in regards to Ms. Roser upcoming MRI on 7/12. Prior authorization has been denied and she is requesting a possible peer-to-peer with the insurance company or seeing if Dr. Antonio would be able to submit more information to get the PA appealed. Callback: [phone] ext: [phone]   ----------------------------------------------------------------------- From previous Reason for Contact - Referral Status: Reason for CRM:

## 2024-04-01 DIAGNOSIS — Z1231 Encounter for screening mammogram for malignant neoplasm of breast: Secondary | ICD-10-CM | POA: Diagnosis not present

## 2024-04-01 LAB — HM MAMMOGRAPHY

## 2024-04-05 ENCOUNTER — Telehealth: Payer: Self-pay | Admitting: Family Medicine

## 2024-04-05 NOTE — Telephone Encounter (Signed)
 Copied from CRM [id number]. Topic: Clinical - Medication Prior Auth >> Apr 05, 2024  9:48 AM Willma SAUNDERS wrote: Reason for CRM: Abigayle from [facility] calling to advise the authorization for patient to has MRI on her brain was denied. Abigayle is inquiring if the office is able to submit more information to have the MRI approved or submit for peer to peer.  Abigayle can be reached at [phone] ext [phone]

## 2024-04-07 NOTE — Telephone Encounter (Unsigned)
 Copied from CRM [id number]. Topic: General - Other >> Apr 07, 2024 11:24 AM Macario HERO wrote: Reason for CRM: Patient said the insurance company is denying the MRI request because they need new information regarding why patient needs a new MRI. They are requesting patient submit last MRI and reason for a new one. Patient is requesting a follow up from Dr. Antonio Meth nurse to further explain.

## 2024-04-08 NOTE — Telephone Encounter (Signed)
 Spoke with [facility] and they will resend the imaging note

## 2024-04-09 NOTE — Telephone Encounter (Unsigned)
 Copied from CRM [id number]. Topic: General - Other >> Apr 09, 2024  8:51 AM Berneda FALCON wrote: Reason for CRM: Pt states she is calling in to check on the status of this MRI approval. She called Wednesday and had not yet heard back. I let her know that we spoke to [facility] and they are going to resend the imaging note for her.   Please call patient back with any updates on this as soon as you can.  Callback is [phone]

## 2024-04-10 ENCOUNTER — Other Ambulatory Visit

## 2024-04-15 ENCOUNTER — Other Ambulatory Visit: Payer: Self-pay | Admitting: Family Medicine

## 2024-04-15 DIAGNOSIS — E785 Hyperlipidemia, unspecified: Secondary | ICD-10-CM

## 2024-04-16 ENCOUNTER — Telehealth: Payer: Self-pay

## 2024-04-16 NOTE — Telephone Encounter (Signed)
 Copied from CRM [id number]. Topic: Clinical - Medication Prior Auth >> Apr 16, 2024  2:29 PM Harlene ORN wrote: Reason for CRM: Michael/REP [employer]. He is requesting the PCP to reach out to [facility] [employer] about the pending authorization that was subitted for the MRI that is currently pending becuase they do not have all of the information that they need to approve of the Authorization. tracking number: [id number] Please call [facility] to discuss the status update - Phone: [phone]

## 2024-04-16 NOTE — Telephone Encounter (Signed)
 Defer to CMA when she returns.

## 2024-04-17 ENCOUNTER — Other Ambulatory Visit: Payer: Self-pay | Admitting: Family Medicine

## 2024-04-17 DIAGNOSIS — I10 Essential (primary) hypertension: Secondary | ICD-10-CM

## 2024-04-20 ENCOUNTER — Telehealth: Payer: Self-pay

## 2024-04-20 NOTE — Telephone Encounter (Signed)
 Copied from CRM [id number]. Topic: Clinical - Medical Advice >> Apr 20, 2024  3:32 PM Mercedes MATSU wrote: Reason for CRM: [employer] called in wanting to know if there was anything that they could do to help in regards to the patients pending MRI that Dr. Antonio Meth ordered. [employer] rep can be reached at [phone] [facility].

## 2024-04-23 ENCOUNTER — Encounter: Payer: Self-pay | Admitting: Family Medicine

## 2024-04-23 ENCOUNTER — Ambulatory Visit: Admitting: Family Medicine

## 2024-04-23 VITALS — BP 122/86 | HR 89 | Temp 98.1°F | Resp 18 | Ht 63.0 in | Wt 161.6 lb

## 2024-04-23 DIAGNOSIS — R519 Headache, unspecified: Secondary | ICD-10-CM | POA: Insufficient documentation

## 2024-04-23 DIAGNOSIS — G9389 Other specified disorders of brain: Secondary | ICD-10-CM | POA: Diagnosis not present

## 2024-04-23 DIAGNOSIS — R9089 Other abnormal findings on diagnostic imaging of central nervous system: Secondary | ICD-10-CM | POA: Diagnosis not present

## 2024-04-23 DIAGNOSIS — R930 Abnormal findings on diagnostic imaging of skull and head, not elsewhere classified: Secondary | ICD-10-CM | POA: Insufficient documentation

## 2024-04-23 NOTE — Assessment & Plan Note (Signed)
 Mri brain w/ and w/o contrast ordered

## 2024-04-23 NOTE — Progress Notes (Signed)
 +  Established Patient Office Visit  Subjective   Patient ID: Cynthia May, female    DOB: 07/30/1951  Age: 73 y.o. MRN: [id number]  Chief Complaint  Patient presents with   Headache   Follow-up    HPI Discussed the use of AI scribe software for clinical note transcription with the patient, who gave verbal consent to proceed.  History of Present Illness Cynthia May is a 73 year old female who presents with new onset headaches and cognitive changes.  She has been experiencing new onset headaches and a sensation of 'brain fog' over the past few weeks. The headaches occur more frequently and require her to take Tylenol at least three times a week. They are particularly bothersome when she lies down at night. She also notes increased sleep duration, now sleeping nine to ten hours a night compared to her usual eight hours.  She has a significant family history of neurological conditions. Her mother and older sister had Alzheimer's disease, another sister had Creutzfeldt-Jakob disease, and another sister had dementia. There are five sisters in her family, and four have had brain problems. Additionally, all the men in her family have had heart trouble, and her father died of a heart attack.  She has been part of a brain study for nine years due to her family history. She has undergone numerous MRIs as part of the study, which had not shown any abnormalities until this year. She is concerned about her recent MRI and the ongoing insurance issues that have delayed further evaluation.   Patient Active Problem List   Diagnosis Date Noted   Frequent headaches 04/23/2024   Abnormal CT of the head 04/23/2024   Brain mass 04/23/2024   Genetic testing 12/24/2023   Bronchitis 08/17/2021   Acute non-recurrent pansinusitis 08/17/2021   Osteoporosis 05/15/2020   Hypothyroidism 03/14/2018   Hyperlipidemia 03/14/2018   Hyperglycemia 03/14/2018   Preventative health care 03/14/2018    Retinal vein occlusion 08/01/2016   Abnormal brain MRI 02/28/2016   Left ankle pain 04/26/2015   CAP (community acquired pneumonia) 01/19/2015   Obstructive apnea 04/26/2014   Depression, major, recurrent (HCC) 11/09/2013   Airway hyperreactivity 03/06/2013   Essential hypertension 03/06/2013   Cannot sleep 03/06/2013   Past Medical History:  Diagnosis Date   Asthma    Depression    Environmental allergies    HTN (hypertension)    Memory loss    Pancreatitis    Retinal vessel occlusion    Thyroid  disease    Past Surgical History:  Procedure Laterality Date   CHOLECYSTECTOMY     EYE SURGERY Bilateral    Cataract sx. Dr.Beavis.   TONSILLECTOMY AND ADENOIDECTOMY     Social History   Tobacco Use   Smoking status: Former   Smokeless tobacco: Never   Tobacco comments:    Quit 1990  Substance Use Topics   Alcohol use: No    Alcohol/week: 0.0 standard drinks of alcohol   Drug use: No   Social History   Socioeconomic History   Marital status: Widowed    Spouse name: Not on file   Number of children: 3   Years of education: Masters   Highest education level: Not on file  Occupational History   Occupation: Retired  Tobacco Use   Smoking status: Former   Smokeless tobacco: Never   Tobacco comments:    Quit 1990  Substance and Sexual Activity   Alcohol use: No    Alcohol/week: 0.0 standard drinks of alcohol  Drug use: No   Sexual activity: Never  Other Topics Concern   Not on file  Social History Narrative   Exercise--  Pt is active during day-- no organized exercise.   Lives at home alone.   Right-handed.   1-2 diet sodas per day.   Social Drivers of Corporate investment banker Strain: Low Risk  (06/24/2022)   Overall Financial Resource Strain (CARDIA)    Difficulty of Paying Living Expenses: Not hard at all  Food Insecurity: No Food Insecurity (06/24/2022)   Hunger Vital Sign    Worried About Running Out of Food in the Last Year: Never true    Ran Out  of Food in the Last Year: Never true  Transportation Needs: No Transportation Needs (06/24/2022)   PRAPARE - Administrator, Civil Service (Medical): No    Lack of Transportation (Non-Medical): No  Physical Activity: Sufficiently Active (06/24/2022)   Exercise Vital Sign    Days of Exercise per Week: 7 days    Minutes of Exercise per Session: 40 min  Stress: No Stress Concern Present (06/24/2022)   [employer] of Occupational Health - Occupational Stress Questionnaire    Feeling of Stress : Not at all  Social Connections: Moderately Integrated (06/24/2022)   Social Connection and Isolation Panel    Frequency of Communication with Friends and Family: More than three times a week    Frequency of Social Gatherings with Friends and Family: More than three times a week    Attends Religious Services: More than 4 times per year    Active Member of [employer] or Organizations: Yes    Attends Banker Meetings: More than 4 times per year    Marital Status: Widowed  Intimate Partner Violence: Not At Risk (06/24/2022)   Humiliation, Afraid, Rape, and Kick questionnaire    Fear of Current or Ex-Partner: No    Emotionally Abused: No    Physically Abused: No    Sexually Abused: No   Family Status  Relation Name Status   Mother  Deceased   Father  Deceased   Sister Laverne Deceased   Sister Marg Deceased   Sister Trudy Deceased       CRUTZFIELD JACOB   Brother  (Not Specified)   Brother  (Not Specified)   Brother  (Not Specified)   Brother  (Not Specified)   Brother  (Not Specified)   PGM  (Not Specified)   Daughter  Alive   Cousin  Deceased   Metallurgist x3 Deceased   Niece  Alive  No partnership data on file   Family History  Problem Relation Age of Onset   Arthritis Mother    Heart disease Mother    Stroke Mother    Thyroid  disease Mother    Alzheimer's disease Mother    Heart disease Father    Depression Sister    Alzheimer's disease Sister     Depression Sister        5 silbling had depression from Thyroid  disease    Arthritis Sister    Dementia Sister    Other Sister        Creutzfeldt-Jacob Disease   Prostate cancer Brother    Prostate cancer Brother    Heart disease Brother    Heart attack Brother    Throat cancer Paternal Grandmother    Breast cancer Daughter        neg genetics   Leukemia Cousin 12  Cancer Nephew    Cancer Nephew    Breast cancer Niece        BRCA1   Allergies  Allergen Reactions   Augmentin [Amoxicillin-Pot Clavulanate] Other (See Comments)    Flu like symptoms   Erythromycin Other (See Comments)    Flu like symptoms   Other     Molds, trees, animal fur, grass.      Review of Systems  Constitutional:  Negative for chills, fever and malaise/fatigue.  HENT:  Negative for congestion and hearing loss.   Eyes:  Negative for blurred vision and discharge.  Respiratory:  Negative for cough, sputum production and shortness of breath.   Cardiovascular:  Negative for chest pain, palpitations and leg swelling.  Gastrointestinal:  Negative for abdominal pain, blood in stool, constipation, diarrhea, heartburn, nausea and vomiting.  Genitourinary:  Negative for dysuria, frequency, hematuria and urgency.  Musculoskeletal:  Negative for back pain, falls and myalgias.  Skin:  Negative for rash.  Neurological:  Positive for headaches. Negative for dizziness, sensory change, loss of consciousness and weakness.  Endo/Heme/Allergies:  Negative for environmental allergies. Does not bruise/bleed easily.  Psychiatric/Behavioral:  Negative for depression and suicidal ideas. The patient is not nervous/anxious and does not have insomnia.       Objective:     BP 122/86 (BP Location: Left Arm, Patient Position: Sitting, Cuff Size: Large)   Pulse 89   Temp 98.1 F (36.7 C) (Oral)   Resp 18   Ht 5' 3 (1.6 m)   Wt 161 lb 9.6 oz (73.3 kg)   SpO2 96%   BMI 28.63 kg/m  BP Readings from Last 3  Encounters:  04/23/24 122/86  08/25/23 136/88  02/11/23 (!) 160/100   Wt Readings from Last 3 Encounters:  04/23/24 161 lb 9.6 oz (73.3 kg)  08/25/23 156 lb 9.6 oz (71 kg)  02/11/23 158 lb 6.4 oz (71.8 kg)   SpO2 Readings from Last 3 Encounters:  04/23/24 96%  08/25/23 97%  02/11/23 97%      Physical Exam Vitals and nursing note reviewed.  Constitutional:      General: She is not in acute distress.    Appearance: Normal appearance. She is well-developed.  HENT:     Head: Normocephalic and atraumatic.  Eyes:     General: No scleral icterus.       Right eye: No discharge.        Left eye: No discharge.  Cardiovascular:     Rate and Rhythm: Normal rate and regular rhythm.     Heart sounds: No murmur heard. Pulmonary:     Effort: Pulmonary effort is normal. No respiratory distress.     Breath sounds: Normal breath sounds.  Musculoskeletal:        General: Normal range of motion.     Cervical back: Normal range of motion and neck supple.     Right lower leg: No edema.     Left lower leg: No edema.  Skin:    General: Skin is warm and dry.  Neurological:     Mental Status: She is alert and oriented to person, place, and time.  Psychiatric:        Mood and Affect: Mood is anxious.        Behavior: Behavior normal.        Thought Content: Thought content normal.        Judgment: Judgment normal.      No results found for any visits on 04/23/24.  Last CBC Lab Results  Component Value Date   WBC 6.6 08/25/2023   HGB 15.2 (H) 08/25/2023   HCT 47.5 (H) 08/25/2023   MCV 92.6 08/25/2023   MCH 29.3 08/17/2021   RDW 14.4 08/25/2023   PLT 213.0 08/25/2023   Last metabolic panel Lab Results  Component Value Date   GLUCOSE 88 08/25/2023   NA 143 08/25/2023   K 5.1 08/25/2023   CL 106 08/25/2023   CO2 31 08/25/2023   BUN 21 08/25/2023   CREATININE 0.99 08/25/2023   GFR 56.37 (L) 08/25/2023   CALCIUM  9.8 08/25/2023   PROT 6.2 08/25/2023   ALBUMIN 4.1 08/25/2023    BILITOT 1.1 08/25/2023   ALKPHOS 63 08/25/2023   AST 20 08/25/2023   ALT 12 08/25/2023   Last lipids Lab Results  Component Value Date   CHOL 134 08/25/2023   HDL 50.60 08/25/2023   LDLCALC 64 08/25/2023   TRIG 98.0 08/25/2023   CHOLHDL 3 08/25/2023   Last hemoglobin A1c Lab Results  Component Value Date   HGBA1C 5.6 03/12/2018   Last thyroid  functions Lab Results  Component Value Date   TSH 1.38 08/25/2023   Last vitamin D No results found for: 25OHVITD2, 25OHVITD3, VD25OH Last vitamin B12 and Folate Lab Results  Component Value Date   VITAMINB12 550 10/24/2017      The 10-year ASCVD risk score (Arnett DK, et al., 2019) is: 16.6%    Assessment & Plan:  Assessment and Plan Assessment & Plan Headache and cognitive changes   She experiences new onset headaches and cognitive changes, such as a foggy feeling and increased sleep duration, which are concerning due to her family history of Alzheimer's, dementia, and Creutzfeldt-Jakob disease. Headaches require Tylenol three times a week. She is worried about a serious underlying condition, especially given the recent finding on her brain MRI, which has not been fully evaluated due to insurance issues. Stress from the insurance denial may be contributing to her symptoms. Submit an appeal to insurance for MRI approval. Use Tylenol as needed for headache relief.  Family history of neurodegenerative diseases   Her significant family history of neurodegenerative diseases, including Alzheimer's disease, dementia, and Creutzfeldt-Jakob disease, affects four out of five sisters. This history increases her concern about her current symptoms and the need for thorough evaluation and monitoring. She participates in a brain study to contribute to research and monitor her condition. Continue participation in the brain study for ongoing monitoring and research contribution.  Insurance and administrative issues   She experiences  significant stress due to insurance denial for a necessary MRI, affecting her mental health. Denial is due to missing documentation from previous tests. Efforts are being made to gather the required information and submit an appeal to the insurance company. The referral center is actively working on the appeal, and a fast appeal option is being pursued to expedite the process. Submit necessary documentation to insurance for MRI approval. Follow up with the referral center to ensure the appeal is processed.   Problem List Items Addressed This Visit       Unprioritized   Frequent headaches - Primary   Brain mass   Mri brain w/ and w/o contrast ordered       Abnormal brain MRI   Mri w/ and w/o contrast was ordered  Ins co needed mri from [facility] It was faxed to ins  Waiting for approval        No follow-ups on file.    Rakim Moone  R Antonio Meth, DO

## 2024-04-23 NOTE — Assessment & Plan Note (Signed)
 Mri w/ and w/o contrast was ordered  Ins co needed mri from [facility] It was faxed to ins  Waiting for approval

## 2024-05-05 DIAGNOSIS — H43811 Vitreous degeneration, right eye: Secondary | ICD-10-CM | POA: Diagnosis not present

## 2024-05-05 DIAGNOSIS — H43393 Other vitreous opacities, bilateral: Secondary | ICD-10-CM | POA: Diagnosis not present

## 2024-05-05 DIAGNOSIS — H34832 Tributary (branch) retinal vein occlusion, left eye, with macular edema: Secondary | ICD-10-CM | POA: Diagnosis not present

## 2024-05-05 DIAGNOSIS — H35033 Hypertensive retinopathy, bilateral: Secondary | ICD-10-CM | POA: Diagnosis not present

## 2024-05-12 ENCOUNTER — Other Ambulatory Visit: Payer: Self-pay | Admitting: Family Medicine

## 2024-05-12 DIAGNOSIS — E785 Hyperlipidemia, unspecified: Secondary | ICD-10-CM

## 2024-06-09 ENCOUNTER — Other Ambulatory Visit: Payer: Self-pay | Admitting: Family Medicine

## 2024-06-09 DIAGNOSIS — E785 Hyperlipidemia, unspecified: Secondary | ICD-10-CM

## 2024-06-12 ENCOUNTER — Ambulatory Visit
Admission: RE | Admit: 2024-06-12 | Discharge: 2024-06-12 | Disposition: A | Discharge status: Home / Self Care | Source: Ambulatory Visit | Attending: Family Medicine | Admitting: Family Medicine

## 2024-06-12 DIAGNOSIS — G9389 Other specified disorders of brain: Secondary | ICD-10-CM

## 2024-06-12 MED ORDER — GADOPICLENOL 0.5 MMOL/ML IV SOLN
7.0000 mL | Freq: Once | INTRAVENOUS | Status: AC | PRN
  Administered 2024-06-12: 7 mL via INTRAVENOUS

## 2024-06-16 ENCOUNTER — Ambulatory Visit: Payer: Self-pay | Admitting: Family Medicine

## 2024-06-21 ENCOUNTER — Encounter: Payer: Self-pay | Admitting: Family Medicine

## 2024-06-21 ENCOUNTER — Ambulatory Visit (INDEPENDENT_AMBULATORY_CARE_PROVIDER_SITE_OTHER): Admitting: Family Medicine

## 2024-06-21 VITALS — BP 122/86 | HR 89 | Temp 98.1°F | Resp 16 | Ht 63.0 in | Wt 160.8 lb

## 2024-06-21 DIAGNOSIS — R519 Headache, unspecified: Secondary | ICD-10-CM | POA: Diagnosis not present

## 2024-06-21 DIAGNOSIS — R9089 Other abnormal findings on diagnostic imaging of central nervous system: Secondary | ICD-10-CM

## 2024-06-21 DIAGNOSIS — Z0000 Encounter for general adult medical examination without abnormal findings: Secondary | ICD-10-CM | POA: Diagnosis not present

## 2024-06-21 DIAGNOSIS — E2839 Other primary ovarian failure: Secondary | ICD-10-CM

## 2024-06-21 DIAGNOSIS — E785 Hyperlipidemia, unspecified: Secondary | ICD-10-CM | POA: Diagnosis not present

## 2024-06-21 DIAGNOSIS — E039 Hypothyroidism, unspecified: Secondary | ICD-10-CM | POA: Diagnosis not present

## 2024-06-21 DIAGNOSIS — Z23 Encounter for immunization: Secondary | ICD-10-CM

## 2024-06-21 DIAGNOSIS — R413 Other amnesia: Secondary | ICD-10-CM

## 2024-06-21 DIAGNOSIS — I10 Essential (primary) hypertension: Secondary | ICD-10-CM | POA: Diagnosis not present

## 2024-06-21 NOTE — Assessment & Plan Note (Signed)
 Encourage heart healthy diet such as MIND or DASH diet, increase exercise, avoid trans fats, simple carbohydrates and processed foods, consider a krill or fish or flaxseed oil cap daily.

## 2024-06-21 NOTE — Addendum Note (Signed)
 Addended by: ROSEBUD NEST A on: 06/21/2024 04:23 PM   Modules accepted: Orders

## 2024-06-21 NOTE — Assessment & Plan Note (Signed)
 Resolve with tylenol  Referral to neuro placed

## 2024-06-21 NOTE — Assessment & Plan Note (Signed)
 Check labs

## 2024-06-21 NOTE — Assessment & Plan Note (Signed)
 With memory loss--- pt is in a study at [facility] Will refer to neurology for further evaluation and treatment

## 2024-06-21 NOTE — Assessment & Plan Note (Signed)
 Ghm utd Check labs  See [facility] Health Maintenance  Topic Date Due   Colonoscopy  07/16/2021   Medicare Annual Wellness (AWV)  06/25/2023   Influenza Vaccine  04/30/2024   DEXA SCAN  05/14/2024   COVID-19 Vaccine (8 - 2024-25 season) 05/31/2024   DTaP/Tdap/Td (2 - Td or Tdap) 01/18/2025   Mammogram  04/01/2025   Pneumococcal Vaccine: 50+ Years  Completed   Hepatitis C Screening  Completed   Zoster Vaccines- Shingrix  Completed   HPV VACCINES  Aged Out   Meningococcal B Vaccine  Aged Out

## 2024-06-21 NOTE — Progress Notes (Signed)
 Subjective:    Patient ID: Cynthia May, female    DOB: September 02, 1951, 73 y.o.   MRN: [id number]  Chief Complaint  Patient presents with   Annual Exam    Pt states not fasting     HPI Patient is in today for cpe.   Discussed the use of AI scribe software for clinical note transcription with the patient, who gave verbal consent to proceed.  History of Present Illness Cynthia May is a 73 year old female who presents with memory issues and headaches.  Her daughter is with her today.     She experiences memory issues, such as forgetting how to get to familiar places and needing assistance with technology. She sometimes forgets to take her medications, despite keeping them in a consistent location and setting alarms. She has been part of a study on Alzheimer's and dementia for nine years but feels she is not receiving adequate feedback from the study.  She experiences headaches approximately three times a week, primarily on the left side of her head, which she associates with a spot found on her MRI in the left parietal lobe. The headaches are relieved by Tylenol. She also reports a 'funny sensation' on the left side of her head.  She lives alone and limits her driving to essential trips to familiar places. She maintains a routine of walking three miles daily, often reading while walking, and feels confident in her safety during these walks.  She has a history of participating in a COVID study and providing stool samples for colon cancer screening, but has not had a recent colonoscopy.  No issues with her stomach, joints, or strength, and denies any concerning moles. She experiences some morning ear drainage but does not take allergy medication.    Past Medical History:  Diagnosis Date   Asthma    Depression    Environmental allergies    HTN (hypertension)    Memory loss    Pancreatitis    Retinal vessel occlusion    Thyroid  disease     Past Surgical History:   Procedure Laterality Date   CHOLECYSTECTOMY     EYE SURGERY Bilateral    Cataract sx. Dr.Beavis.   TONSILLECTOMY AND ADENOIDECTOMY      Family History  Problem Relation Age of Onset   Arthritis Mother    Heart disease Mother    Stroke Mother    Thyroid  disease Mother    Alzheimer's disease Mother    Heart disease Father    Depression Sister    Alzheimer's disease Sister    Depression Sister        5 silbling had depression from Thyroid  disease    Arthritis Sister    Dementia Sister    Other Sister        Creutzfeldt-Jacob Disease   Prostate cancer Brother    Prostate cancer Brother    Heart disease Brother    Heart attack Brother    Throat cancer Paternal Grandmother    Breast cancer Daughter        neg genetics   Leukemia Cousin 12   Cancer Nephew    Cancer Nephew    Breast cancer Niece        BRCA1    Social History   Socioeconomic History   Marital status: Widowed    Spouse name: Not on file   Number of children: 3   Years of education: Masters   Highest education level: Not on file  Occupational History  Occupation: Retired  Tobacco Use   Smoking status: Former   Smokeless tobacco: Never   Tobacco comments:    Quit 1990  Substance and Sexual Activity   Alcohol use: No    Alcohol/week: 0.0 standard drinks of alcohol   Drug use: No   Sexual activity: Never  Other Topics Concern   Not on file  Social History Narrative   Exercise--  Pt is active during day-- no organized exercise.   Lives at home alone.   Right-handed.   1-2 diet sodas per day.   Social Drivers of Corporate investment banker Strain: Low Risk  (06/24/2022)   Overall Financial Resource Strain (CARDIA)    Difficulty of Paying Living Expenses: Not hard at all  Food Insecurity: No Food Insecurity (06/24/2022)   Hunger Vital Sign    Worried About Running Out of Food in the Last Year: Never true    Ran Out of Food in the Last Year: Never true  Transportation Needs: No  Transportation Needs (06/24/2022)   PRAPARE - Administrator, Civil Service (Medical): No    Lack of Transportation (Non-Medical): No  Physical Activity: Sufficiently Active (06/24/2022)   Exercise Vital Sign    Days of Exercise per Week: 7 days    Minutes of Exercise per Session: 40 min  Stress: No Stress Concern Present (06/24/2022)   [employer] of Occupational Health - Occupational Stress Questionnaire    Feeling of Stress : Not at all  Social Connections: Moderately Integrated (06/24/2022)   Social Connection and Isolation Panel    Frequency of Communication with Friends and Family: More than three times a week    Frequency of Social Gatherings with Friends and Family: More than three times a week    Attends Religious Services: More than 4 times per year    Active Member of [employer] or Organizations: Yes    Attends Banker Meetings: More than 4 times per year    Marital Status: Widowed  Intimate Partner Violence: Not At Risk (06/24/2022)   Humiliation, Afraid, Rape, and Kick questionnaire    Fear of Current or Ex-Partner: No    Emotionally Abused: No    Physically Abused: No    Sexually Abused: No    Outpatient Medications Prior to Visit  Medication Sig Dispense Refill   alendronate  (FOSAMAX ) 70 MG tablet TAKE 1 TABLET BY MOUTH ONCE A WEEK ON AN EMPTY STOMACH WITH  A  FULL  GLASS  OF  WATER 12 tablet 1   atorvastatin  (LIPITOR) 10 MG tablet Take 1 tablet (10 mg total) by mouth daily. 90 tablet 1   DULoxetine (CYMBALTA) 60 MG capsule Take 1 capsule by mouth daily.  5   levothyroxine (SYNTHROID) 125 MCG tablet Take 1 tablet (125 mcg total) by mouth daily. 90 tablet 3   lisinopril  (ZESTRIL ) 40 MG tablet Take 1 tablet by mouth once daily 90 tablet 0   traZODone (DESYREL) 50 MG tablet Take 1 nightly for sleep     lidocaine  (XYLOCAINE ) 2 % solution Use as directed 15 mLs in the mouth or throat every 6 (six) hours as needed for mouth pain. 100 mL 0   No  facility-administered medications prior to visit.    Allergies  Allergen Reactions   Augmentin [Amoxicillin-Pot Clavulanate] Other (See Comments)    Flu like symptoms   Erythromycin Other (See Comments)    Flu like symptoms   Other     Molds, trees, animal fur, grass.  Review of Systems  Constitutional:  Negative for fever and malaise/fatigue.  HENT:  Negative for congestion.   Eyes:  Negative for blurred vision.  Respiratory:  Negative for shortness of breath.   Cardiovascular:  Negative for chest pain, palpitations and leg swelling.  Gastrointestinal:  Negative for abdominal pain, blood in stool and nausea.  Genitourinary:  Negative for dysuria and frequency.  Musculoskeletal:  Negative for falls.  Skin:  Negative for rash.  Neurological:  Negative for dizziness, loss of consciousness and headaches.  Endo/Heme/Allergies:  Negative for environmental allergies.  Psychiatric/Behavioral:  Positive for memory loss. Negative for depression. The patient is not nervous/anxious.        Objective:    Physical Exam Vitals and nursing note reviewed.  Constitutional:      General: She is not in acute distress.    Appearance: Normal appearance. She is well-developed.  HENT:     Head: Normocephalic and atraumatic.     Right Ear: Tympanic membrane, ear canal and external ear normal. There is no impacted cerumen.     Left Ear: Tympanic membrane, ear canal and external ear normal. There is no impacted cerumen.     Nose: Nose normal.     Mouth/Throat:     Mouth: Mucous membranes are moist.     Pharynx: Oropharynx is clear. No oropharyngeal exudate or posterior oropharyngeal erythema.  Eyes:     General: No scleral icterus.       Right eye: No discharge.        Left eye: No discharge.     Conjunctiva/sclera: Conjunctivae normal.     Pupils: Pupils are equal, round, and reactive to light.  Neck:     Thyroid : No thyromegaly or thyroid  tenderness.     Vascular: No JVD.   Cardiovascular:     Rate and Rhythm: Normal rate and regular rhythm.     Heart sounds: Normal heart sounds. No murmur heard. Pulmonary:     Effort: Pulmonary effort is normal. No respiratory distress.     Breath sounds: Normal breath sounds.  Abdominal:     General: Bowel sounds are normal. There is no distension.     Palpations: Abdomen is soft. There is no mass.     Tenderness: There is no abdominal tenderness. There is no guarding or rebound.  Musculoskeletal:        General: Normal range of motion.     Cervical back: Normal range of motion and neck supple.     Right lower leg: No edema.     Left lower leg: No edema.  Lymphadenopathy:     Cervical: No cervical adenopathy.  Skin:    General: Skin is warm and dry.     Findings: No erythema or rash.  Neurological:     Mental Status: She is alert and oriented to person, place, and time.     Cranial Nerves: No cranial nerve deficit.     Deep Tendon Reflexes: Reflexes are normal and symmetric.  Psychiatric:        Mood and Affect: Mood normal.        Behavior: Behavior normal.        Thought Content: Thought content normal.        Judgment: Judgment normal.     BP 122/86 (BP Location: Left Arm, Patient Position: Sitting, Cuff Size: Normal)   Pulse 89   Temp 98.1 F (36.7 C) (Oral)   Resp 16   Ht 5' 3 (1.6 m)   Wt  160 lb 12.8 oz (72.9 kg)   SpO2 97%   BMI 28.48 kg/m  Wt Readings from Last 3 Encounters:  06/21/24 160 lb 12.8 oz (72.9 kg)  04/23/24 161 lb 9.6 oz (73.3 kg)  08/25/23 156 lb 9.6 oz (71 kg)    Diabetic Foot Exam - Simple   No data filed    Lab Results  Component Value Date   WBC 6.6 08/25/2023   HGB 15.2 (H) 08/25/2023   HCT 47.5 (H) 08/25/2023   PLT 213.0 08/25/2023   GLUCOSE 88 08/25/2023   CHOL 134 08/25/2023   TRIG 98.0 08/25/2023   HDL 50.60 08/25/2023   LDLCALC 64 08/25/2023   ALT 12 08/25/2023   AST 20 08/25/2023   NA 143 08/25/2023   K 5.1 08/25/2023   CL 106 08/25/2023    CREATININE 0.99 08/25/2023   BUN 21 08/25/2023   CO2 31 08/25/2023   TSH 1.38 08/25/2023   HGBA1C 5.6 03/12/2018    Lab Results  Component Value Date   TSH 1.38 08/25/2023   Lab Results  Component Value Date   WBC 6.6 08/25/2023   HGB 15.2 (H) 08/25/2023   HCT 47.5 (H) 08/25/2023   MCV 92.6 08/25/2023   PLT 213.0 08/25/2023   Lab Results  Component Value Date   NA 143 08/25/2023   K 5.1 08/25/2023   CO2 31 08/25/2023   GLUCOSE 88 08/25/2023   BUN 21 08/25/2023   CREATININE 0.99 08/25/2023   BILITOT 1.1 08/25/2023   ALKPHOS 63 08/25/2023   AST 20 08/25/2023   ALT 12 08/25/2023   PROT 6.2 08/25/2023   ALBUMIN 4.1 08/25/2023   CALCIUM  9.8 08/25/2023   GFR 56.37 (L) 08/25/2023   Lab Results  Component Value Date   CHOL 134 08/25/2023   Lab Results  Component Value Date   HDL 50.60 08/25/2023   Lab Results  Component Value Date   LDLCALC 64 08/25/2023   Lab Results  Component Value Date   TRIG 98.0 08/25/2023   Lab Results  Component Value Date   CHOLHDL 3 08/25/2023   Lab Results  Component Value Date   HGBA1C 5.6 03/12/2018       Assessment & Plan:  Preventative health care Assessment & Plan: Ghm utd Check labs  See AVS Health Maintenance  Topic Date Due   Colonoscopy  07/16/2021   Medicare Annual Wellness (AWV)  06/25/2023   Influenza Vaccine  04/30/2024   DEXA SCAN  05/14/2024   COVID-19 Vaccine (8 - 2024-25 season) 05/31/2024   DTaP/Tdap/Td (2 - Td or Tdap) 01/18/2025   Mammogram  04/01/2025   Pneumococcal Vaccine: 50+ Years  Completed   Hepatitis C Screening  Completed   Zoster Vaccines- Shingrix  Completed   HPV VACCINES  Aged Out   Meningococcal B Vaccine  Aged Out      Memory loss -     Ambulatory referral to Neurology -     CBC with Differential/Platelet -     Comprehensive metabolic panel with GFR -     TSH -     Vitamin B12 -     VITAMIN D  25 Hydroxy (Vit-D Deficiency, Fractures)  Hyperlipidemia, unspecified  hyperlipidemia type Assessment & Plan: Encourage heart healthy diet such as MIND or DASH diet, increase exercise, avoid trans fats, simple carbohydrates and processed foods, consider a krill or fish or flaxseed oil cap daily.    Orders: -     Lipid panel  Frequent headaches Assessment &  Plan: Resolve with tylenol  Referral to neuro placed   Orders: -     Ambulatory referral to Neurology -     Sedimentation rate  Primary hypertension  Hypothyroidism, unspecified type Assessment & Plan: Check labs  Orders: -     TSH  Need for influenza vaccination -     Flu vaccine HIGH DOSE PF(Fluzone Trivalent)  Estrogen deficiency -     DG Bone Density; Future  Abnormal brain MRI Assessment & Plan: With memory loss--- pt is in a study at [facility] Will refer to neurology for further evaluation and treatment    Essential hypertension Assessment & Plan: Well controlled, no changes to meds. Encouraged heart healthy diet such as the DASH diet and exercise as tolerated.     Assessment and Plan Assessment & Plan Cognitive impairment   She experiences cognitive impairment with forgetfulness, difficulty remembering to take medications, and occasional disorientation. Safety concerns, such as getting lost or forgetting to turn off the stove, were discussed. She participates in a long-term study on Alzheimer's and dementia but lacks feedback from it. Set up a neurology referral for further evaluation and management. Order additional tests and labs today.  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 The neoplasm has been present for a long time, raising concerns about its impact on surrounding structures. She is part of a study but has not received detailed feedback. Ensure MRI results are available for the neurology consultation. Discuss with the study team at [facility] about sharing information with the  neurologist.  Headache   She experiences headaches approximately three times a week, primarily on the left side, correlating with the benign neoplasm. Tylenol relieves the headaches, indicating they are likely mild. Continue using Tylenol for relief as needed. Include headache management in the neurology referral.  General Health Maintenance   Routine health maintenance was discussed, including vaccinations and screenings. She is overdue for a colonoscopy and bone density screening. She is physically active, walking three miles daily, and is up to date with eye and dental check-ups. Administer the flu shot today. Order a bone density screening and schedule it with radiology. Request results of the recent Cologuard test and consider it as a substitute for a colonoscopy if results are available.    Worthy Boschert R Lowne Chase, DO

## 2024-06-21 NOTE — Assessment & Plan Note (Signed)
 Well controlled, no changes to meds. Encouraged heart healthy diet such as the DASH diet and exercise as tolerated.

## 2024-06-22 DIAGNOSIS — F330 Major depressive disorder, recurrent, mild: Secondary | ICD-10-CM | POA: Diagnosis not present

## 2024-06-22 DIAGNOSIS — F5101 Primary insomnia: Secondary | ICD-10-CM | POA: Diagnosis not present

## 2024-06-23 ENCOUNTER — Encounter: Payer: Self-pay | Admitting: Physician Assistant

## 2024-06-24 ENCOUNTER — Other Ambulatory Visit (INDEPENDENT_AMBULATORY_CARE_PROVIDER_SITE_OTHER)

## 2024-06-24 DIAGNOSIS — R413 Other amnesia: Secondary | ICD-10-CM

## 2024-06-24 DIAGNOSIS — E039 Hypothyroidism, unspecified: Secondary | ICD-10-CM | POA: Diagnosis not present

## 2024-06-24 DIAGNOSIS — R519 Headache, unspecified: Secondary | ICD-10-CM | POA: Diagnosis not present

## 2024-06-24 DIAGNOSIS — E785 Hyperlipidemia, unspecified: Secondary | ICD-10-CM | POA: Diagnosis not present

## 2024-06-24 LAB — CBC WITH DIFFERENTIAL/PLATELET
Basophils Absolute: 0 K/uL (ref 0.0–0.1)
Basophils Relative: 0.8 % (ref 0.0–3.0)
Eosinophils Absolute: 0.2 K/uL (ref 0.0–0.7)
Eosinophils Relative: 3.3 % (ref 0.0–5.0)
HCT: 45.1 % (ref 36.0–46.0)
Hemoglobin: 14.6 g/dL (ref 12.0–15.0)
Lymphocytes Relative: 25.2 % (ref 12.0–46.0)
Lymphs Abs: 1.3 K/uL (ref 0.7–4.0)
MCHC: 32.5 g/dL (ref 30.0–36.0)
MCV: 88.7 fl (ref 78.0–100.0)
Monocytes Absolute: 0.3 K/uL (ref 0.1–1.0)
Monocytes Relative: 6.7 % (ref 3.0–12.0)
Neutro Abs: 3.2 K/uL (ref 1.4–7.7)
Neutrophils Relative %: 64 % (ref 43.0–77.0)
Platelets: 194 K/uL (ref 150.0–400.0)
RBC: 5.09 Mil/uL (ref 3.87–5.11)
RDW: 13.8 % (ref 11.5–15.5)
WBC: 5 K/uL (ref 4.0–10.5)

## 2024-06-24 LAB — COMPREHENSIVE METABOLIC PANEL WITH GFR
ALT: 14 U/L (ref 0–35)
AST: 21 U/L (ref 0–37)
Albumin: 4.1 g/dL (ref 3.5–5.2)
Alkaline Phosphatase: 64 U/L (ref 39–117)
BUN: 16 mg/dL (ref 6–23)
CO2: 32 meq/L (ref 19–32)
Calcium: 9.9 mg/dL (ref 8.4–10.5)
Chloride: 104 meq/L (ref 96–112)
Creatinine, Ser: 0.86 mg/dL (ref 0.40–1.20)
GFR: 66.36 mL/min (ref >60.00)
Glucose, Bld: 98 mg/dL (ref 70–99)
Potassium: 4.8 meq/L (ref 3.5–5.1)
Sodium: 142 meq/L (ref 135–145)
Total Bilirubin: 1.1 mg/dL (ref 0.2–1.2)
Total Protein: 6.3 g/dL (ref 6.0–8.3)

## 2024-06-24 LAB — LIPID PANEL
Cholesterol: 136 mg/dL (ref 0–200)
HDL: 53.8 mg/dL (ref >39.00)
LDL Cholesterol: 63 mg/dL (ref 0–99)
NonHDL: 82.36
Total CHOL/HDL Ratio: 3
Triglycerides: 97 mg/dL (ref 0.0–149.0)
VLDL: 19.4 mg/dL (ref 0.0–40.0)

## 2024-06-24 LAB — TSH: TSH: 2.28 u[IU]/mL (ref 0.35–5.50)

## 2024-06-24 LAB — VITAMIN B12: Vitamin B-12: 528 pg/mL (ref 211–911)

## 2024-06-24 LAB — SEDIMENTATION RATE: Sed Rate: 2 mm/h (ref 0–30)

## 2024-06-24 LAB — VITAMIN D 25 HYDROXY (VIT D DEFICIENCY, FRACTURES): VITD: 30.7 ng/mL (ref 30.00–100.00)

## 2024-07-05 DIAGNOSIS — H43813 Vitreous degeneration, bilateral: Secondary | ICD-10-CM | POA: Diagnosis not present

## 2024-07-05 DIAGNOSIS — H35033 Hypertensive retinopathy, bilateral: Secondary | ICD-10-CM | POA: Diagnosis not present

## 2024-07-05 DIAGNOSIS — H34832 Tributary (branch) retinal vein occlusion, left eye, with macular edema: Secondary | ICD-10-CM | POA: Diagnosis not present

## 2024-07-05 DIAGNOSIS — H43393 Other vitreous opacities, bilateral: Secondary | ICD-10-CM | POA: Diagnosis not present

## 2024-07-13 ENCOUNTER — Ambulatory Visit (HOSPITAL_BASED_OUTPATIENT_CLINIC_OR_DEPARTMENT_OTHER)
Admission: RE | Admit: 2024-07-13 | Discharge: 2024-07-13 | Disposition: A | Discharge status: Home / Self Care | Source: Ambulatory Visit | Attending: Family Medicine | Admitting: Family Medicine

## 2024-07-13 ENCOUNTER — Other Ambulatory Visit: Payer: Self-pay | Admitting: Family Medicine

## 2024-07-13 DIAGNOSIS — E2839 Other primary ovarian failure: Secondary | ICD-10-CM | POA: Diagnosis not present

## 2024-07-13 DIAGNOSIS — M8589 Other specified disorders of bone density and structure, multiple sites: Secondary | ICD-10-CM | POA: Diagnosis not present

## 2024-07-13 DIAGNOSIS — I10 Essential (primary) hypertension: Secondary | ICD-10-CM

## 2024-07-13 DIAGNOSIS — Z780 Asymptomatic menopausal state: Secondary | ICD-10-CM | POA: Diagnosis not present

## 2024-07-14 ENCOUNTER — Ambulatory Visit: Payer: Self-pay | Admitting: Family Medicine

## 2024-07-14 DIAGNOSIS — E039 Hypothyroidism, unspecified: Secondary | ICD-10-CM | POA: Diagnosis not present

## 2024-07-20 ENCOUNTER — Other Ambulatory Visit: Payer: Self-pay | Admitting: Family Medicine

## 2024-08-30 LAB — OPHTHALMOLOGY REPORT-SCANNED

## 2024-09-01 ENCOUNTER — Telehealth: Payer: Self-pay | Admitting: Family Medicine

## 2024-09-01 NOTE — Telephone Encounter (Signed)
 Copied from CRM #[id number]. Topic: Medicare AWV >> Sep 01, 2024  9:51 AM Nathanel DEL wrote: Called LVM 09/01/2024 to sched AWV. Please schedule in office or virtual visit.   Nathanel Paschal; Care Guide Ambulatory Clinical Support [facility] Direct Dial: [phone]

## 2024-09-12 NOTE — Progress Notes (Signed)
 Cynthia May is a very pleasant 73 y.o. year old RH female with a history of hypertension, hyperlipidemia,hyperglycemia, prior h/o RVO,  hypothyroidism, MDD, anxiety,   insomnia, vit D deficiency, seen today for evaluation of memory loss. MoCA today is . She was enrolled in a dementia trial  at [facility] but due to abnormal MRI brain she was unable to participate.  Patient is able to participate on ADLs and to to drive without difficulties. Mood is ***   MRI brain to further evaluate for structural abnormalities and vascular load  Neuropsychological evaluation for further investigate other causes of memory loss including sleep, attention, anxiety, depression among others Check B12   Follow up in *** months  *** pending on the above results  Continue to control mood as per psychiatry, she is o Cymbalta and trazodone   Discussed the use of AI scribe software for clinical note transcription with the patient, who gave verbal consent to proceed.  History of Present Illness   Patient reports having difficulty with memory for several years, noting issues driving to familiar locations, losing her train of thought for why she entered a room, forgetting events from week prior. She has a family history of Alzheimer disease and as she was having these symptoms, she was interested in knowing if she may have markers for AD and enrolled in a study at [facility]. During the study, patient had MRI brain which showed abnormal findings and her participation in the study was postponed. Unfortunately, MRI is not available to review. She was referred to neurology with Dr. Onita. She had a repeat MRI in May 2017 which per outside report showed a non enhancing lesion in the left medial parietal lobe, involving white and gray matter with cortical volume loss, possibly suggestive for ischemia. She had additional testing for stroke work up including TTE and carotid US , which showed mild diastolic dysfunction and no  significant carotid stenosis, respectively. She later developed decline in vision and found to have retinal vein occlusion, currently continues treatment and follow up with ophthalmology with injections per patient. She as also placed on ASA and lipitor.   Patient also complains of headaches that have increased over the past 6-8 months, now occurring 2 times a week, 5 out of 10 in severity, lasting 3-4 hours, frontal and occipital in location, non radiating, pressure sensation in quality. She states taking tylenol or ASA causes headache to go away away 1 hour. She denies having migraine headaches in the past or as a child/teenager. She denies aura, nausea, vomiting, numbness, weakness in extremities, dizziness, diplopia. She states her main reason for coming was to have a second opinion. She reports she was told by Dr. Onita to have a repeat image at 1 year, but did not understand why the repeat imaging was being ordered. She has no additional neurologic complaints today.      How long did patient have memory difficulties?  For about 10 years.  Patient reports some difficulty remembering new information, recent conversations, names. LTM is good. Enjoys redig, socializig, kntting. Enjoys spendng time with grandchildren.  repeats oneself?  Endorsed Disoriented when walking into a room? Denies ***  Leaving objects in unusual places?  Denies.   Wandering behavior? Denies.   Any personality changes, or depression, anxiety? Denies *** Hallucinations or paranoia? Denies.   Seizures? Denies.    Any sleep changes?  Sleeps well with trazodone*** Does not sleep well. **  frequent nightmares or dream reenactment,  other REM behavior or sleepwalking   Sleep apnea? Denies.   Any hygiene concerns?  Denies.   Independent of bathing and dressing? Endorsed  Does the patient need help with medications?  is in charge *** Who is in charge of the finances?  is in charge   *** Any changes in appetite?   Denies. ***    Patient have trouble swallowing?  Denies.   Does the patient cook? No*** yes, denies forgetting common recipes or kitchen accidents *** Any headaches? ***.   Chronic pain? Denies.   Ambulates with difficulty? Denies. Walks frequently Recent falls or head injuries? Denies. In Sep 22, 2001 she fell of tf the ladder 5 feet and hit her occipital area, no LOC, transient confusion. Another episode in 09-22-2014 after prolongued tubing in hot summer day she laded on a rock with trasient LOC.  Vision changes?  Denies any new issues.  Has a history of*** Any strokelike symptoms? Denies.   Any tremors? Denies. *** Any anosmia? Denies.   Any incontinence of urine? Denies.   Any bowel dysfunction? Denies.      Patient lives alone after husband's death in 22-Sep-2006, recently moved from [place] ,  she has downsized, adjusting to it.  ***  History of heavy alcohol intake? Denies.   History of heavy tobacco use? Denies.   Family history of dementia?   Mother and a sister had Alzheimer's dementia, another sister had CJD  Does patient drive? No longer drives  *** yes, denies getting lost.***  Masters Degree, retired engineer, site since age 57    Recent labs 07/2024 : TSH 1.985, nl CBC and CMP. Vit B12 528  Recent MRI brain 9/13.25, personally reviewed remarkable for stable MVNT L parietal lobe, stable since September 22, 2016 ( appears benign), no acute findings. Mild chronic SVD.   Allergies[1]  Current Outpatient Medications  Medication Instructions   alendronate  (FOSAMAX ) 70 MG tablet TAKE 1 TABLET BY MOUTH ONCE A WEEK ON AN EMPTY STOMACH WITH  FULL  GLASS  OF  WATER   atorvastatin  (LIPITOR) 10 mg, Oral, Daily   DULoxetine (CYMBALTA) 60 MG capsule 1 capsule, Daily   levothyroxine (SYNTHROID) 125 mcg, Daily   lisinopril  (ZESTRIL ) 40 mg, Oral, Daily   traZODone (DESYREL) 50 MG tablet Take 1 nightly for sleep     VITALS:  There were no vitals filed for this visit.   Neurological Exam      No data to display              01/24/2017   10:55 AM 08/28/2016   11:37 AM 02/28/2016    9:03 AM  MMSE - Mini Mental State Exam  Orientation to time 5  5  5    Orientation to Place 5  5  5    Registration 3  3  3    Attention/ Calculation 5  5  5    Recall 3  3  3    Language- name 2 objects 2  2  2    Language- repeat 1 1 1   Language- follow 3 step command 3  3  3    Language- read & follow direction 1  1  1    Write a sentence 1  1  1    Copy design 1  1  1    Total score 30  30  30       Data saved with a previous flowsheet row definition       Orientation:  Alert and oriented to person, place and not to time***. No aphasia  or dysarthria. Fund of knowledge is appropriate. Recent and remote memory impaired.  Attention and concentration are reduced***.  Able to name objects and repeat phrases. *** Delayed recall  /5 .*** Cranial nerves: There is good facial symmetry. Extraocular muscles are intact and visual fields are full to confrontational testing. Speech is fluent and clear. No tongue deviation. Hearing is intact to conversational tone.*** Tone: Tone is good throughout. Sensation: Sensation is intact to light touch.  Vibration is intact at the bilateral big toe.  Coordination: The patient has no difficulty with RAM's or FNF bilaterally. Normal finger to nose  Motor: Strength is 5/5 in the bilateral upper and lower extremities. There is no pronator drift. There are no fasciculations noted. DTR's: Deep tendon reflexes are 2/4 bilaterally. Gait and Station: The patient is able to ambulate without difficulty. Gait is cautious and narrow. Stride length is normal. ***      Thank you for allowing us  the opportunity to participate in the care of this nice patient. Please do not hesitate to contact us  for any questions or concerns.   Total time spent on today's visit was *** minutes dedicated to this patient today, preparing to see patient, examining the patient, ordering tests and/or medications and counseling the  patient, documenting clinical information in the EHR or other health record, independently interpreting results and communicating results to the patient/family, discussing treatment and goals, answering patient's questions and coordinating care.  Cc:  Antonio Cyndee Jamee JONELLE, DO  [facility] 09/12/2024 2:40 PM      [1]  Allergies Allergen Reactions   Augmentin [Amoxicillin-Pot Clavulanate] Other (See Comments)    Flu like symptoms   Erythromycin Other (See Comments)    Flu like symptoms   Other     Molds, trees, animal fur, grass.

## 2024-09-13 ENCOUNTER — Telehealth: Payer: Self-pay | Admitting: Physician Assistant

## 2024-09-13 ENCOUNTER — Ambulatory Visit

## 2024-09-13 ENCOUNTER — Ambulatory Visit: Admitting: Physician Assistant

## 2024-09-13 VITALS — BP 141/92 | HR 88 | Ht 63.0 in | Wt 159.0 lb

## 2024-09-13 DIAGNOSIS — R413 Other amnesia: Secondary | ICD-10-CM | POA: Insufficient documentation

## 2024-09-13 MED ORDER — DONEPEZIL HCL 10 MG PO TABS: ORAL_TABLET | ORAL | 3 refills | Status: DC

## 2024-09-13 MED ORDER — DONEPEZIL HCL 10 MG PO TABS: ORAL_TABLET | ORAL | 3 refills | Status: AC

## 2024-09-13 NOTE — Patient Instructions (Addendum)
 It was a pleasure to see you today at our office.   Recommendations:  Neurocognitive evaluation at our office   We will start donepezil  half tablet (5mg ) daily for 2  weeks.  If you are tolerating the medication, then after 2 weeks, we will increase the dose to a full tablet of 10 mg daily. Follow up in 3  months For psychiatric meds, mood meds: Please have your primary care physician manage these medications.  If you have any severe symptoms of a stroke, or other severe issues such as confusion,severe chills or fever, etc call 911 or go to the ER as you may need to be evaluated further Counseling regarding caregiver distress, including caregiver depression, anxiety and issues regarding community resources, adult day care programs, adult living facilities, or memory care questions:  please contact your  Primary Doctor's Social Worker   FOR Memory  decline, memory medications: Call our office [phone]    [email or web address]   RECOMMENDATIONS FOR ALL PATIENTS WITH MEMORY PROBLEMS: 1. Continue to exercise (Recommend 30 minutes of walking everyday, or 3 hours every week) 2. Increase social interactions - continue going to [place] and enjoy social gatherings with friends and family 3. Eat healthy, avoid fried foods and eat more fruits and vegetables 4. Maintain adequate blood pressure, blood sugar, and blood cholesterol level. Reducing the risk of stroke and cardiovascular disease also helps promoting better memory. 5. Avoid stressful situations. Live a simple life and avoid aggravations. Organize your time and prepare for the next day in anticipation. 6. Sleep well, avoid any interruptions of sleep and avoid any distractions in the bedroom that may interfere with adequate sleep quality 7. Avoid sugar, avoid sweets as there is a strong link between excessive sugar intake, diabetes, and cognitive impairment We discussed the Mediterranean  diet, which has been shown to help patients reduce the risk of progressive memory disorders and reduces cardiovascular risk. This includes eating fish, eat fruits and green leafy vegetables, nuts like almonds and hazelnuts, walnuts, and also use olive oil. Avoid fast foods and fried foods as much as possible. Avoid sweets and sugar as sugar use has been linked to worsening of memory function.  There is always a concern of gradual progression of memory problems. If this is the case, then we may need to adjust level of care according to patient needs. Support, both to the patient and caregiver, should then be put into place.      You have been referred for a neuropsychological evaluation (i.e., evaluation of memory and thinking abilities). Please bring someone with you to this appointment if possible, as it is helpful for the doctor to hear from both you and another adult who knows you well. Please bring eyeglasses and hearing aids if you wear them.    The evaluation will take approximately 3 hours and has two parts:   The first part is a clinical interview with the neuropsychologist (Dr. Richie or Dr. Gayland). During the interview, the neuropsychologist will speak with you and the individual you brought to the appointment.    The second part of the evaluation is testing with the doctor's technician Neal or Luke). During the testing, the technician will ask you to remember different types of material, solve problems, and answer some questionnaires. Your family member will not be present for this portion of the evaluation.   Please note: We must reserve several hours of the neuropsychologist's time and the psychometrician's time for your evaluation appointment. As such, there is a  No-Show fee of $100. If you are unable to attend any of your appointments, please contact our office as soon as possible to reschedule.      DRIVING: Regarding driving, in patients with progressive memory problems, driving  will be impaired. We advise to have someone else do the driving if trouble finding directions or if minor accidents are reported. Independent driving assessment is available to determine safety of driving.   If you are interested in the driving assessment, you can contact the following:  [facility] [employer] [facility] in [place] [phone]  [facility] [phone]  [facility] [phone]  [facility] [phone] or [phone]   FALL PRECAUTIONS: Be cautious when walking. Scan the area for obstacles that may increase the risk of trips and falls. When getting up in the mornings, sit up at the edge of the bed for a few minutes before getting out of bed. Consider elevating the bed at the head end to avoid drop of blood pressure when getting up. Walk always in a well-lit room (use night lights in the walls). Avoid area rugs or power cords from appliances in the middle of the walkways. Use a walker or a cane if necessary and consider physical therapy for balance exercise. Get your eyesight checked regularly.  FINANCIAL OVERSIGHT: Supervision, especially oversight when making financial decisions or transactions is also recommended.  HOME SAFETY: Consider the safety of the kitchen when operating appliances like stoves, microwave oven, and blender. Consider having supervision and share cooking responsibilities until no longer able to participate in those. Accidents with firearms and other hazards in the house should be identified and addressed as well.   ABILITY TO BE LEFT ALONE: If patient is unable to contact 911 operator, consider using LifeLine, or when the need is there, arrange for someone to stay with patients. Smoking is a fire hazard, consider supervision or cessation. Risk of wandering should be assessed by caregiver and if detected at any point, supervision and safe proof recommendations should be instituted.  MEDICATION SUPERVISION: Inability to  self-administer medication needs to be constantly addressed. Implement a mechanism to ensure safe administration of the medications.      Mediterranean Diet A Mediterranean diet refers to food and lifestyle choices that are based on the traditions of countries located on the [place]. This way of eating has been shown to help prevent certain conditions and improve outcomes for people who have chronic diseases, like kidney disease and heart disease. What are tips for following this plan? Lifestyle  Cook and eat meals together with your family, when possible. Drink enough fluid to keep your urine clear or pale yellow. Be physically active every day. This includes: Aerobic exercise like running or swimming. Leisure activities like gardening, walking, or housework. Get 7-8 hours of sleep each night. If recommended by your health care provider, drink red wine in moderation. This means 1 glass a day for nonpregnant women and 2 glasses a day for men. A glass of wine equals 5 oz (150 mL). Reading food labels  Check the serving size of packaged foods. For foods such as rice and pasta, the serving size refers to the amount of cooked product, not dry. Check the total fat in packaged foods. Avoid foods that have saturated fat or trans fats. Check the ingredients list for added sugars, such as corn syrup. Shopping  At the grocery store, buy most of your food from the areas near the walls of the store. This includes: Fresh fruits and  vegetables (produce). Grains, beans, nuts, and seeds. Some of these may be available in unpackaged forms or large amounts (in bulk). Fresh seafood. Poultry and eggs. Low-fat dairy products. Buy whole ingredients instead of prepackaged foods. Buy fresh fruits and vegetables in-season from local farmers markets. Buy frozen fruits and vegetables in resealable bags. If you do not have access to quality fresh seafood, buy precooked frozen shrimp or canned fish, such  as tuna, salmon, or sardines. Buy small amounts of raw or cooked vegetables, salads, or olives from the deli or salad bar at your store. Stock your pantry so you always have certain foods on hand, such as olive oil, canned tuna, canned tomatoes, rice, pasta, and beans. Cooking  Cook foods with extra-virgin olive oil instead of using butter or other vegetable oils. Have meat as a side dish, and have vegetables or grains as your main dish. This means having meat in small portions or adding small amounts of meat to foods like pasta or stew. Use beans or vegetables instead of meat in common dishes like chili or lasagna. Experiment with different cooking methods. Try roasting or broiling vegetables instead of steaming or sauteing them. Add frozen vegetables to soups, stews, pasta, or rice. Add nuts or seeds for added healthy fat at each meal. You can add these to yogurt, salads, or vegetable dishes. Marinate fish or vegetables using olive oil, lemon juice, garlic, and fresh herbs. Meal planning  Plan to eat 1 vegetarian meal one day each week. Try to work up to 2 vegetarian meals, if possible. Eat seafood 2 or more times a week. Have healthy snacks readily available, such as: Vegetable sticks with hummus. Greek yogurt. Fruit and nut trail mix. Eat balanced meals throughout the week. This includes: Fruit: 2-3 servings a day Vegetables: 4-5 servings a day Low-fat dairy: 2 servings a day Fish, poultry, or lean meat: 1 serving a day Beans and legumes: 2 or more servings a week Nuts and seeds: 1-2 servings a day Whole grains: 6-8 servings a day Extra-virgin olive oil: 3-4 servings a day Limit red meat and sweets to only a few servings a month What are my food choices? Mediterranean diet Recommended Grains: Whole-grain pasta. Brown rice. Bulgar wheat. Polenta. Couscous. Whole-wheat bread. [place] Made[place]. Vegetables: Artichokes. Beets. Broccoli. Cabbage. Carrots. Eggplant. Green beans. Chard.  Kale. Spinach. Onions. Leeks. Peas. Squash. Tomatoes. Peppers. Radishes. Fruits: Apples. Apricots. Avocado. Berries. Bananas. Cherries. Dates. Figs. Grapes. Lemons. Melon. Oranges. Peaches. Plums. Pomegranate. Meats and other protein foods: Beans. Almonds. Sunflower seeds. Pine nuts. Peanuts. Cod. Salmon. Scallops. Shrimp. Tuna. Tilapia. Clams. Oysters. Eggs. Dairy: Low-fat milk. Cheese. Greek yogurt. Beverages: Water. Red wine. Herbal tea. Fats and oils: Extra virgin olive oil. Avocado oil. Grape seed oil. Sweets and desserts: Greek yogurt with honey. Baked apples. Poached pears. Trail mix. Seasoning and other foods: Basil. Cilantro. Coriander. Cumin. Mint. Parsley. Sage. Rosemary. Tarragon. Garlic. Oregano. Thyme. Pepper. Balsalmic vinegar. Tahini. Hummus. Tomato sauce. Olives. Mushrooms. Limit these Grains: Prepackaged pasta or rice dishes. Prepackaged cereal with added sugar. Vegetables: Deep fried potatoes (french fries). Fruits: Fruit canned in syrup. Meats and other protein foods: Beef. Pork. Lamb. Poultry with skin. Hot dogs. [place]. Dairy: Ice cream. Sour cream. Whole milk. Beverages: Juice. Sugar-sweetened soft drinks. Beer. Liquor and spirits. Fats and oils: Butter. Canola oil. Vegetable oil. Beef fat (tallow). Lard. Sweets and desserts: Cookies. Cakes. Pies. Candy. Seasoning and other foods: Mayonnaise. Premade sauces and marinades. The items listed may not be a complete list. Talk with  your dietitian about what dietary choices are right for you. Summary The Mediterranean diet includes both food and lifestyle choices. Eat a variety of fresh fruits and vegetables, beans, nuts, seeds, and whole grains. Limit the amount of red meat and sweets that you eat. Talk with your health care provider about whether it is safe for you to drink red wine in moderation. This means 1 glass a day for nonpregnant women and 2 glasses a day for men. A glass of wine equals 5 oz (150 mL). This information  is not intended to replace advice given to you by your health care provider. Make sure you discuss any questions you have with your health care provider. Document Released: 05/09/2016 Document Revised: 06/11/2016 Document Reviewed: 05/09/2016 Elsevier Interactive Patient Education  2017 [employer].

## 2024-09-13 NOTE — Telephone Encounter (Signed)
 Cynthia May with pharmacy called and LM with AN.  She rec'd two of the same RX with the same instrauctions but they aren't sure they're duplicated and needs clarification.

## 2024-09-14 NOTE — Telephone Encounter (Signed)
° °  Spoke to [place]: Only one Rx: Take half tablet (5 mg) daily for 2 weeks, then increase to the full tablet at 10 mg daily

## 2024-10-13 ENCOUNTER — Ambulatory Visit: Payer: Self-pay | Admitting: Psychology

## 2024-10-13 DIAGNOSIS — R419 Unspecified symptoms and signs involving cognitive functions and awareness: Secondary | ICD-10-CM | POA: Diagnosis not present

## 2024-10-13 DIAGNOSIS — F4321 Adjustment disorder with depressed mood: Secondary | ICD-10-CM | POA: Diagnosis not present

## 2024-10-13 DIAGNOSIS — R4189 Other symptoms and signs involving cognitive functions and awareness: Secondary | ICD-10-CM
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
## 2024-10-13 NOTE — Progress Notes (Signed)
" ° °  Psychometrician Note   Cognitive testing was administered to Cynthia May by Cynthia May, B.S. (psychometrist) under the supervision of Cynthia May, Psy.D., licensed psychologist on 10/13/2024. Cynthia May did not appear overtly distressed by the testing session per behavioral observation or responses across self-report questionnaires. Rest breaks were offered.   The battery of tests administered was selected by Cynthia May, Psy.D. with consideration to Cynthia May current level of functioning, the nature of her symptoms, emotional and behavioral responses during interview, level of literacy, observed level of motivation/effort, and the nature of the referral question. This battery was communicated to the psychometrist. Communication between Cynthia May, Psy.D. and the psychometrist was ongoing throughout the evaluation and Cynthia May, Psy.D. was immediately accessible at all times. Cynthia May, Psy.D. provided supervision to the psychometrist on the date of this service to the extent necessary to assure the quality of all services provided.    Cynthia May will return within approximately 1-2 weeks for an interactive feedback session with Dr. Beckwith at which time her test performances, clinical impressions, and treatment recommendations will be reviewed in detail. Cynthia May understands she can contact our office should she require our assistance before this time.  A total of 118 minutes of billable time were spent face-to-face with Cynthia May by the psychometrist. This includes both test administration and scoring time. Billing for these services is reflected in the clinical report generated by Cynthia May, Psy.D.  This note reflects time spent with the psychometrician and does not include test scores or any clinical interpretations made by Dr. Beckwith. The full report will follow in a separate note. "

## 2024-10-15 ENCOUNTER — Ambulatory Visit: Payer: Self-pay

## 2024-10-18 ENCOUNTER — Ambulatory Visit: Payer: Self-pay | Admitting: *Deleted

## 2024-10-18 VITALS — Ht 63.0 in | Wt 160.0 lb

## 2024-10-18 DIAGNOSIS — Z0000 Encounter for general adult medical examination without abnormal findings: Secondary | ICD-10-CM | POA: Diagnosis not present

## 2024-10-18 DIAGNOSIS — Z1231 Encounter for screening mammogram for malignant neoplasm of breast: Secondary | ICD-10-CM

## 2024-10-18 DIAGNOSIS — Z1211 Encounter for screening for malignant neoplasm of colon: Secondary | ICD-10-CM

## 2024-10-18 NOTE — Progress Notes (Signed)
 "  Chief Complaint  Patient presents with   Medicare Wellness     Subjective:   Cynthia May is a 74 y.o. female who presents for a Medicare Annual Wellness Visit.  Visit info / Clinical Intake: Medicare Wellness Visit Type:: Subsequent Annual Wellness Visit Persons participating in visit and providing information:: patient Medicare Wellness Visit Mode:: Telephone If telephone:: video declined Since this visit was completed virtually, some vitals may be partially provided or unavailable. Missing vitals are due to the limitations of the virtual format.: Unable to obtain vitals - no equipment If Telephone or Video please confirm:: I connected with patient using audio/video enable telemedicine. I verified patient identity with two identifiers, discussed telehealth limitations, and patient agreed to proceed. Patient Location:: home Provider Location:: office Interpreter Needed?: No Pre-visit prep was completed: yes AWV questionnaire completed by patient prior to visit?: no Living arrangements:: (!) lives alone Patient's Overall Health Status Rating: very good (feels physical health is very good and cognitive health is not as good) Typical amount of pain: some (has occasional headaches) Does pain affect daily life?: no Are you currently prescribed opioids?: no  Dietary Habits and Nutritional Risks How many meals a day?: 2 (also has a snack during the day) Eats fruit and vegetables daily?: yes Most meals are obtained by: preparing own meals In the last 2 weeks, have you had any of the following?: none Diabetic:: no  Functional Status Activities of Daily Living (to include ambulation/medication): Independent Ambulation: Independent Medication Administration: Independent Home Management (perform basic housework or laundry): Independent Manage your own finances?: yes Primary transportation is: driving Concerns about vision?: no *vision screening is required for WT[facility]* (Up to date  with [facility]) Concerns about hearing?: no  Fall Screening Falls in the past year?: 0 Number of falls in past year: 0 Was there an injury with Fall?: 0 Fall Risk Category Calculator: 0 Patient Fall Risk Level: Low Fall Risk  Fall Risk Patient at Risk for Falls Due to: No Fall Risks Fall risk Follow up: Falls evaluation completed  Home and Transportation Safety: All rugs have non-skid backing?: yes All stairs or steps have railings?: N/A, no stairs Grab bars in the bathtub or shower?: (!) no (hasn't needed one) Have non-skid surface in bathtub or shower?: yes Good home lighting?: yes Regular seat belt use?: yes Hospital stays in the last year:: no  Cognitive Assessment Difficulty concentrating, remembering, or making decisions? : yes Will 6CIT or Mini Cog be Completed: no 6CIT or Mini Cog Declined: patient has a diagnosis of dementia or cognitive impairment  Advance Directives (For Healthcare) Does Patient Have a Medical Advance Directive?: Yes Does patient want to make changes to medical advance directive?: No - Patient declined Type of Advance Directive: Healthcare Power of [place]; Living will Copy of Healthcare Power of Attorney in Chart?: Yes - validated most recent copy scanned in chart (See row information) Copy of Living Will in Chart?: Yes - validated most recent copy scanned in chart (See row information)  Reviewed/Updated  Reviewed/Updated: Reviewed All (Medical, Surgical, Family, Medications, Allergies, Care Teams, Patient Goals)    Allergies (verified) Augmentin [amoxicillin-pot clavulanate], Erythromycin, and Other   Current Medications (verified) Outpatient Encounter Medications as of 10/18/2024  Medication Sig   alendronate  (FOSAMAX ) 70 MG tablet TAKE 1 TABLET BY MOUTH ONCE A WEEK ON AN EMPTY STOMACH WITH  FULL  GLASS  OF  WATER   atorvastatin  (LIPITOR) 10 MG tablet Take 1 tablet (10 mg total) by mouth daily.  donepezil  (ARICEPT ) 10 MG tablet Take  half tablet (5 mg) daily for 2 weeks, then increase to the full tablet at 10 mg daily   DULoxetine (CYMBALTA) 60 MG capsule Take 1 capsule by mouth daily. May take up to 2 tablets daily   levothyroxine (SYNTHROID) 125 MCG tablet Take 1 tablet (125 mcg total) by mouth daily.   lisinopril  (ZESTRIL ) 40 MG tablet Take 1 tablet (40 mg total) by mouth daily.   Multiple Vitamin (MULTIVITAMIN) tablet Take 1 tablet by mouth daily.   Omega-3 Fatty Acids (FISH OIL) 1000 MG CAPS Take 1 capsule by mouth daily at 6 (six) AM.   traZODone (DESYREL) 50 MG tablet Take 2 tablets by mouth at bedtime.   No facility-administered encounter medications on file as of 10/18/2024.    History: Past Medical History:  Diagnosis Date   Asthma    Depression    Environmental allergies    HTN (hypertension)    Memory loss    Pancreatitis    Retinal vessel occlusion    Thyroid  disease    Past Surgical History:  Procedure Laterality Date   CHOLECYSTECTOMY     EYE SURGERY Bilateral    Cataract sx. Dr.Beavis.   TONSILLECTOMY AND ADENOIDECTOMY     Family History  Problem Relation Age of Onset   Arthritis Mother    Heart disease Mother    Stroke Mother    Thyroid  disease Mother    Alzheimer's disease Mother    Heart disease Father    Depression Sister    Alzheimer's disease Sister    Depression Sister        5 silbling had depression from Thyroid  disease    Arthritis Sister    Dementia Sister    Other Sister        Creutzfeldt-Jacob Disease   Prostate cancer Brother    Prostate cancer Brother    Heart disease Brother    Heart attack Brother    Cancer Daughter        breast   Breast cancer Daughter        neg genetics   Throat cancer Paternal Grandmother    Leukemia Cousin 12   Cancer Nephew    Cancer Nephew    Breast cancer Niece        BRCA1   Social History   Occupational History   Occupation: Retired    Media Planner: Other / Not Applicable   Occupation: retired  Tobacco Use   Smoking  status: Former   Smokeless tobacco: Never   Tobacco comments:    Quit 1990  Vaping Use   Vaping status: Never Used  Substance and Sexual Activity   Alcohol use: Yes    Comment: rare   Drug use: No   Sexual activity: Never   Tobacco Counseling Counseling given: Not Answered Tobacco comments: Quit 1990  SDOH Screenings   Food Insecurity: No Food Insecurity (10/18/2024)  Housing: Low Risk (10/18/2024)  Transportation Needs: No Transportation Needs (10/18/2024)  Utilities: Not At Risk (10/18/2024)  Alcohol Screen: Low Risk (06/24/2022)  Depression (PHQ2-9): Low Risk (10/18/2024)  Financial Resource Strain: Low Risk (06/24/2022)  Physical Activity: Sufficiently Active (10/18/2024)  Social Connections: Moderately Integrated (10/18/2024)  Stress: No Stress Concern Present (10/18/2024)  Tobacco Use: Medium Risk (10/18/2024)   See flowsheets for full screening details  Depression Screen PHQ 2 & 9 Depression Scale- Over the past 2 weeks, how often have you been bothered by any of the following problems? Little interest or pleasure  in doing things: 0 Feeling down, depressed, or hopeless (PHQ Adolescent also includes...irritable): 1 PHQ-2 Total Score: 1 Trouble falling or staying asleep, or sleeping too much: 1 (takes medication and it helps) Feeling tired or having little energy: 0 Poor appetite or overeating (PHQ Adolescent also includes...weight loss): 0 Feeling bad about yourself - or that you are a failure or have let yourself or your family down: 0 Trouble concentrating on things, such as reading the newspaper or watching television (PHQ Adolescent also includes...like school work): 0 Moving or speaking so slowly that other people could have noticed. Or the opposite - being so fidgety or restless that you have been moving around a lot more than usual: 0 Thoughts that you would be better off dead, or of hurting yourself in some way: 0 PHQ-9 Total Score: 2 If you checked off any problems,  how difficult have these problems made it for you to do your work, take care of things at home, or get along with other people?: Somewhat difficult  Depression Treatment Depression Interventions/Treatment : PHQ2-9 Score <4 Follow-up Not Indicated     Goals Addressed             This Visit's Progress    COMPLETED: DIET - INCREASE WATER INTAKE       COMPLETED: Increase physical activity       To make 9 first Fridays at [facility]               Objective:    Today's Vitals   10/18/24 1301  Weight: 160 lb (72.6 kg)  Height: 5' 3 (1.6 m)   Body mass index is 28.34 kg/m.  Hearing/Vision screen No results found. Immunizations and Health Maintenance Health Maintenance  Topic Date Due   Colonoscopy  07/16/2021   COVID-19 Vaccine (8 - 2025-26 season) 01/14/2025   DTaP/Tdap/Td (2 - Td or Tdap) 01/18/2025   Mammogram  04/01/2025   Medicare Annual Wellness (AWV)  10/18/2025   Bone Density Scan  07/13/2026   Pneumococcal Vaccine: 50+ Years  Completed   Influenza Vaccine  Completed   Hepatitis C Screening  Completed   Zoster Vaccines- Shingrix  Completed   Meningococcal B Vaccine  Aged Out        Assessment/Plan:  This is a routine wellness examination for Cynthia May.  Patient Care Team: Antonio Meth, Jamee SAUNDERS, DO as PCP - General (Family Medicine) Maurice Loving, MD as Referring Physician (Psychiatry) Tobie Grange, MD as Referring Physician (Endocrinology) Raj Rankin SAUNDERS, MD as Consulting Physician (Ophthalmology) Dina Camie FORBES DEVONNA (Neurology)  I have personally reviewed and noted the following in the patients chart:   Medical and social history Use of alcohol, tobacco or illicit drugs  Current medications and supplements including opioid prescriptions. Functional ability and status Nutritional status Physical activity Advanced directives List of other physicians Hospitalizations, surgeries, and ER visits in previous 12 months Vitals Screenings to include  cognitive, depression, and falls Referrals and appointments  No orders of the defined types were placed in this encounter.  In addition, I have reviewed and discussed with patient certain preventive protocols, quality metrics, and best practice recommendations. A written personalized care plan for preventive services as well as general preventive health recommendations were provided to patient.   Lolita Libra, CMA   10/18/2024   Return in 1 year (on 10/18/2025).  After Visit Summary: (MyChart) Due to this being a telephonic visit, the after visit summary with patients personalized plan was offered to patient via MyChart  Nurse Notes: HM Addressed: Mammogram ordered Cologuard Ordered  "

## 2024-10-18 NOTE — Patient Instructions (Addendum)
 Cynthia May,  Thank you for taking the time for your Medicare Wellness Visit. I appreciate your continued commitment to your health goals. Please review the care plan we discussed, and feel free to reach out if I can assist you further.  Please note that Annual Wellness Visits do not include a physical exam. Some assessments may be limited, especially if the visit was conducted virtually. If needed, we may recommend an in-person follow-up with your provider.  Goal: To make it to 9, First Fridays at [place].  Ongoing Care Seeing your primary care provider every 3 to 6 months helps us  monitor your health and provide consistent, personalized care.   Dr Antonio Meth: 12/23/25 10:20am Medicare AWV: 10/25/25 1pm, telephone  Referrals If a referral was made during today's visit and you haven't received any updates within two weeks, please contact the referred provider directly to check on the status.  Mammogram ([facility]) due 04/01/25 or after:  [id number]  Cologuard has been ordered for you today and you should receive it in the mail within 1-2 weeks. Please complete it and mail it back as soon as possible.   Recommended Screenings:  Health Maintenance  Topic Date Due   Colon Cancer Screening  07/16/2021   Medicare Annual Wellness Visit  06/25/2023   COVID-19 Vaccine (8 - 2025-26 season) 01/14/2025   DTaP/Tdap/Td vaccine (2 - Td or Tdap) 01/18/2025   Breast Cancer Screening  04/01/2025   Osteoporosis screening with Bone Density Scan  07/13/2026   Pneumococcal Vaccine for age over 28  Completed   Flu Shot  Completed   Hepatitis C Screening  Completed   Zoster (Shingles) Vaccine  Completed   Meningitis B Vaccine  Aged Out       10/18/2024    1:07 PM  Advanced Directives  Does Patient Have a Medical Advance Directive? Yes  Type of Estate Agent of [place];Living will  Does patient want to make changes to medical advance directive? No - Patient declined   Copy of Healthcare Power of Attorney in Chart? Yes - validated most recent copy scanned in chart (See row information)    Vision: Annual vision screenings are recommended for early detection of glaucoma, cataracts, and diabetic retinopathy. These exams can also reveal signs of chronic conditions such as diabetes and high blood pressure.  Dental: Annual dental screenings help detect early signs of oral cancer, gum disease, and other conditions linked to overall health, including heart disease and diabetes.  Please see the attached documents for additional preventive care recommendations.

## 2024-10-21 ENCOUNTER — Ambulatory Visit: Payer: Self-pay | Admitting: Psychology

## 2024-10-21 DIAGNOSIS — F4321 Adjustment disorder with depressed mood: Secondary | ICD-10-CM

## 2024-10-21 DIAGNOSIS — R419 Unspecified symptoms and signs involving cognitive functions and awareness: Secondary | ICD-10-CM | POA: Diagnosis not present

## 2024-10-21 NOTE — Progress Notes (Signed)
" ° °  NEUROPSYCHOLOGY FEEDBACK SESSION [place]. [facility]  [place] Department of Neurology  Date of Feedback Session: 10/21/2024  REASON FOR REFERRAL   Cynthia May is a 74 year old, right-handed, White female with 18 years of formal education. She was referred for neuropsychological evaluation by Camie Sevin, PA-C, to assess current neurocognitive functioning, document potential cognitive deficits, and assist with treatment planning. She is currently enrolled in a healthy brain study at [facility], and as part of that study has undergone various cognitive assessments, including brief cognitive screeners and more comprehensive testing. Although findings from these assessments are not available, it is important to note that she has had prior exposure to multiple cognitive tests, some of which may be repeated today, potentially resulting in practice effects.  FEEDBACK   Patient completed a comprehensive neuropsychological evaluation on 10/13/2024. Please refer to that encounter for the full report and recommendations. Briefly, results indicated normal cognitive functioning. Additionally, functional abilities are generally well preserved. Although she reports noticing changes, there is no evidence of marked cognitive impairment at present, and more subtle changes cannot be confidently determined in the absence of a prior baseline for comparison. What can be stated with confidence is that her current performance is normal relative to age-matched peers. It is also worth noting that factors such as depression, small vessel disease, and sleep disturbance can contribute to subjective cognitive complaints, even in the context of objectively normal performance.  Today, the patient was unaccompanied. She was provided verbal feedback regarding the findings and impression during this visit, and her questions were answered. A copy of the report was provided at the conclusion of the  visit.  DISPOSITION   No follow-up neuropsychological testing was scheduled at this time. Please feel free to refer the patient for repeated evaluation if she shows a significant change in neurocognitive status.  SERVICE   This feedback session was conducted by Renda Beckwith, Psy.D. [place] of [id number] (40 minutes) was billed for Dr. Beckwith' time spent in preparing, conducting, and documenting the current feedback session.  This report was generated using voice recognition software. While this document has been carefully reviewed, transcription errors may be present. I apologize in advance for any inconvenience. Please contact me if further clarification is needed.  "

## 2024-12-13 ENCOUNTER — Ambulatory Visit: Payer: Self-pay | Admitting: Physician Assistant

## 2024-12-23 ENCOUNTER — Ambulatory Visit: Admitting: Family Medicine

## 2025-10-25 ENCOUNTER — Ambulatory Visit
